# Patient Record
Sex: Male | Born: 1960
Health system: Southern US, Community
[De-identification: ages and names within clinical notes are randomized; demographics above are authoritative.]

## PROBLEM LIST (undated history)

## (undated) DIAGNOSIS — E119 Type 2 diabetes mellitus without complications: Secondary | ICD-10-CM

## (undated) DIAGNOSIS — Z8719 Personal history of other diseases of the digestive system: Secondary | ICD-10-CM

## (undated) DIAGNOSIS — H269 Unspecified cataract: Secondary | ICD-10-CM

## (undated) DIAGNOSIS — M1990 Unspecified osteoarthritis, unspecified site: Secondary | ICD-10-CM

## (undated) DIAGNOSIS — T7840XA Allergy, unspecified, initial encounter: Secondary | ICD-10-CM

## (undated) DIAGNOSIS — K219 Gastro-esophageal reflux disease without esophagitis: Secondary | ICD-10-CM

## (undated) DIAGNOSIS — R071 Chest pain on breathing: Secondary | ICD-10-CM

## (undated) DIAGNOSIS — G5600 Carpal tunnel syndrome, unspecified upper limb: Secondary | ICD-10-CM

## (undated) DIAGNOSIS — K5792 Diverticulitis of intestine, part unspecified, without perforation or abscess without bleeding: Secondary | ICD-10-CM

## (undated) DIAGNOSIS — J302 Other seasonal allergic rhinitis: Secondary | ICD-10-CM

## (undated) DIAGNOSIS — K625 Hemorrhage of anus and rectum: Secondary | ICD-10-CM

## (undated) DIAGNOSIS — N183 Chronic kidney disease, stage 3 unspecified: Secondary | ICD-10-CM

## (undated) DIAGNOSIS — M810 Age-related osteoporosis without current pathological fracture: Secondary | ICD-10-CM

## (undated) DIAGNOSIS — R0789 Other chest pain: Secondary | ICD-10-CM

## (undated) DIAGNOSIS — E785 Hyperlipidemia, unspecified: Secondary | ICD-10-CM

## (undated) DIAGNOSIS — I10 Essential (primary) hypertension: Secondary | ICD-10-CM

## (undated) DIAGNOSIS — G8929 Other chronic pain: Secondary | ICD-10-CM

## (undated) HISTORY — DX: Diverticulitis of intestine, part unspecified, without perforation or abscess without bleeding: K57.92

## (undated) HISTORY — DX: Unspecified cataract: H26.9

## (undated) HISTORY — DX: Unspecified osteoarthritis, unspecified site: M19.90

## (undated) HISTORY — DX: Allergy, unspecified, initial encounter: T78.40XA

## (undated) HISTORY — DX: Carpal tunnel syndrome, unspecified upper limb: G56.00

## (undated) HISTORY — DX: Personal history of other diseases of the digestive system: Z87.19

## (undated) HISTORY — DX: Chronic kidney disease, stage 3 (moderate): N18.3

## (undated) HISTORY — DX: Other chest pain: R07.89

## (undated) HISTORY — DX: Age-related osteoporosis without current pathological fracture: M81.0

## (undated) HISTORY — PX: FRACTURE SURGERY: SHX138

## (undated) HISTORY — DX: Chest pain on breathing: R07.1

## (undated) HISTORY — PX: ANKLE FRACTURE SURGERY: SHX122

## (undated) HISTORY — PX: OTHER SURGICAL HISTORY: SHX169

## (undated) HISTORY — PX: WRIST SURGERY: SHX841

## (undated) HISTORY — DX: Type 2 diabetes mellitus without complications: E11.9

## (undated) HISTORY — DX: Hemorrhage of anus and rectum: K62.5

## (undated) HISTORY — DX: Chronic kidney disease, stage 3 unspecified: N18.30

## (undated) HISTORY — PX: HERNIA REPAIR: SHX51

---

## 1982-03-04 HISTORY — PX: WRIST FRACTURE SURGERY: SHX121

## 1999-03-22 ENCOUNTER — Encounter: Payer: Self-pay | Admitting: *Deleted

## 1999-03-22 ENCOUNTER — Ambulatory Visit (HOSPITAL_COMMUNITY): Admission: RE | Admit: 1999-03-22 | Discharge: 1999-03-22 | Payer: Self-pay | Admitting: *Deleted

## 2001-07-19 ENCOUNTER — Emergency Department (HOSPITAL_COMMUNITY): Admission: EM | Admit: 2001-07-19 | Discharge: 2001-07-19 | Payer: Self-pay | Admitting: Emergency Medicine

## 2003-08-12 ENCOUNTER — Inpatient Hospital Stay (HOSPITAL_COMMUNITY): Admission: EM | Admit: 2003-08-12 | Discharge: 2003-08-17 | Payer: Self-pay | Admitting: Emergency Medicine

## 2004-07-19 ENCOUNTER — Observation Stay (HOSPITAL_COMMUNITY): Admission: RE | Admit: 2004-07-19 | Discharge: 2004-07-20 | Payer: Self-pay | Admitting: Orthopedic Surgery

## 2004-11-22 ENCOUNTER — Emergency Department (HOSPITAL_COMMUNITY): Admission: EM | Admit: 2004-11-22 | Discharge: 2004-11-23 | Payer: Self-pay | Admitting: Emergency Medicine

## 2011-04-23 ENCOUNTER — Emergency Department (HOSPITAL_COMMUNITY): Payer: Worker's Compensation

## 2011-04-23 ENCOUNTER — Encounter (HOSPITAL_COMMUNITY): Payer: Self-pay | Admitting: *Deleted

## 2011-04-23 ENCOUNTER — Emergency Department (HOSPITAL_COMMUNITY)
Admission: EM | Admit: 2011-04-23 | Discharge: 2011-04-23 | Disposition: A | Discharge status: Home Health | Payer: Worker's Compensation | Attending: Emergency Medicine | Admitting: Emergency Medicine

## 2011-04-23 DIAGNOSIS — M79609 Pain in unspecified limb: Secondary | ICD-10-CM | POA: Insufficient documentation

## 2011-04-23 DIAGNOSIS — IMO0002 Reserved for concepts with insufficient information to code with codable children: Secondary | ICD-10-CM | POA: Insufficient documentation

## 2011-04-23 DIAGNOSIS — Z79899 Other long term (current) drug therapy: Secondary | ICD-10-CM | POA: Insufficient documentation

## 2011-04-23 DIAGNOSIS — S6990XA Unspecified injury of unspecified wrist, hand and finger(s), initial encounter: Secondary | ICD-10-CM | POA: Insufficient documentation

## 2011-04-23 DIAGNOSIS — M25539 Pain in unspecified wrist: Secondary | ICD-10-CM | POA: Insufficient documentation

## 2011-04-23 DIAGNOSIS — M25439 Effusion, unspecified wrist: Secondary | ICD-10-CM | POA: Insufficient documentation

## 2011-04-23 DIAGNOSIS — I10 Essential (primary) hypertension: Secondary | ICD-10-CM | POA: Insufficient documentation

## 2011-04-23 DIAGNOSIS — S63502A Unspecified sprain of left wrist, initial encounter: Secondary | ICD-10-CM

## 2011-04-23 DIAGNOSIS — S63509A Unspecified sprain of unspecified wrist, initial encounter: Secondary | ICD-10-CM | POA: Insufficient documentation

## 2011-04-23 DIAGNOSIS — Y990 Civilian activity done for income or pay: Secondary | ICD-10-CM | POA: Insufficient documentation

## 2011-04-23 DIAGNOSIS — S59909A Unspecified injury of unspecified elbow, initial encounter: Secondary | ICD-10-CM | POA: Insufficient documentation

## 2011-04-23 HISTORY — DX: Essential (primary) hypertension: I10

## 2011-04-23 MED ORDER — ACETAMINOPHEN 500 MG PO TABS
1000.0000 mg | ORAL_TABLET | ORAL | Status: AC
  Administered 2011-04-23: 12:00:00 via ORAL
  Filled 2011-04-23: qty 2

## 2011-04-23 MED ORDER — ACETAMINOPHEN 325 MG PO TABS
ORAL_TABLET | ORAL | Status: AC
  Filled 2011-04-23: qty 3

## 2011-04-23 NOTE — Discharge Instructions (Signed)
Joint Sprain A sprain is a tear or stretch in the ligaments that hold a joint together. Severe sprains may need as long as 3-6 weeks of immobilization and/or exercises to heal completely. Sprained joints should be rested and protected. If not, they can become unstable and prone to re-injury. Proper treatment can reduce your pain, shorten the period of disability, and reduce the risk of repeated injuries. TREATMENT   Rest and elevate the injured joint to reduce pain and swelling.   Apply ice packs to the injury for 20-30 minutes every 2-3 hours for the next 2-3 days.   Keep the injury wrapped in a compression bandage or splint as long as the joint is painful or as instructed by your caregiver.   Do not use the injured joint until it is completely healed to prevent re-injury and chronic instability. Follow the instructions of your caregiver.   Long-term sprain management may require exercises and/or treatment by a physical therapist. Taping or special braces may help stabilize the joint until it is completely better.  SEEK MEDICAL CARE IF:   You develop increased pain or swelling of the joint.   You develop increasing redness and warmth of the joint.   You develop a fever.   It becomes stiff.   Your hand or foot gets cold or numb.  Document Released: 03/28/2004 Document Revised: 10/31/2010 Document Reviewed: 03/07/2008 [facility] Patient Information 2012 [place], [place].  Wash the abrasion each day with soap and water on your left forearm and then place a thin layer of bacitracin ointment and cover with a sterile bandage. Wear the splint as needed for comfort on your wrist. See your work comp doctor tomorrow for followup Your x-ray today was normal. Take Tylenol as directed for pain

## 2011-04-23 NOTE — ED Provider Notes (Signed)
History     CSN: [id number]  Arrival date & time 04/23/11  1020   First MD Initiated Contact with Patient 04/23/11 1122      Chief Complaint  Patient presents with  . Arm Injury  . Wrist Pain    (Consider location/radiation/quality/duration/timing/severity/associated sxs/prior treatment) HPI Complains of left wrist pain and abrasion to left forearm after injury occurring 9:45 AM today. Left wrist is worse with movement radial aspect of wrist improved by anything abrasion nonpainful no other injury. Injury occurred at work when a spring loaded bed struck him as a direct blow to the left upper extremity. No other associated symptoms no treatment prior to coming here last tetanus shot less than 10 years ago. Past Medical History  Diagnosis Date  . Hypertension     Past Surgical History  Procedure Date  . Ankle fracture surgery   . Wrist surgery     History reviewed. No pertinent family history.  History  Substance Use Topics  . Smoking status: Never Smoker   . Smokeless tobacco: Not on file  . Alcohol Use: No      Review of Systems  Constitutional: Negative.   Musculoskeletal: Positive for arthralgias.       Left wrist pain  Skin: Positive for wound.       Abrasion to left forearm    Allergies  Codeine; Ivp dye; and Sulfa antibiotics  Home Medications   Current Outpatient Rx  Name Route Sig Dispense Refill  . AMLODIPINE BESYLATE 10 MG PO TABS Oral Take 10 mg by mouth daily.    . ASPIRIN EC 81 MG PO TBEC Oral Take 81 mg by mouth daily.    Marland Kitchen CALCIUM CARBONATE 600 MG PO TABS Oral Take 600 mg by mouth 2 (two) times daily with a meal.    . CARVEDILOL PO Oral Take 1 tablet by mouth 2 (two) times daily.    Marland Kitchen CETIRIZINE HCL 10 MG PO TABS Oral Take 10 mg by mouth daily.    . LUTEIN PO Oral Take 1 tablet by mouth daily.    . MELOXICAM 15 MG PO TABS Oral Take 15 mg by mouth daily.    . ADULT MULTIVITAMIN W/MINERALS CH Oral Take 1 tablet by mouth daily.    Marland Kitchen OMEPRAZOLE  20 MG PO CPDR Oral Take 20 mg by mouth daily.    Marland Kitchen SIMVASTATIN 20 MG PO TABS Oral Take 20 mg by mouth every evening.      BP 139/84  Pulse 80  Temp(Src) 98.2 F (36.8 C) (Oral)  Resp 15  SpO2 98%  Physical Exam  Nursing note and vitals reviewed. Constitutional: He appears well-developed and well-nourished. No distress.  HENT:  Head: Normocephalic and atraumatic.  Eyes: Pupils are equal, round, and reactive to light.  Neck: Neck supple.  Cardiovascular: Normal rate.   Pulmonary/Chest: Effort normal.  Abdominal: There is no tenderness.  Musculoskeletal:       Left upper extremity: Abrasion at ulnar aspect of forearm middle third approximately 3 cm in diameter, irregular, nontender wrist with swelling over dorsal lateral aspect tender at radial aspect of wrist no anatomic snuffbox tenderness radial pulse 2+ all fingers with good capillary refill    ED Course  Procedures (including critical care time)  Labs Reviewed - No data to display No results found.  No results found for this or any previous visit. Dg Wrist Complete Left  04/23/2011  *RADIOLOGY REPORT*  Clinical Data: Injured left wrist with pain  LEFT WRIST -  COMPLETE 3+ VIEW  Comparison: None.  Findings: The radiocarpal joint space is only slightly narrowed. No acute fracture is seen.  There are degenerative changes in the left wrist with subchondral cyst formation and sclerosis. Alignment is normal.  IMPRESSION: Degenerative and possibly post-traumatic change.  No acute abnormality.  Original Report Authenticated By: Juline Patch, M.D.    No diagnosis found.    MDM  Declines pain medicine and declines prescription for pain medicine Plan Velcro wrist splint follow up with Worker's Comp. Doctor tomorrow Local wound care with antibiotic ointment and cleansing of abrasion at left forearm Diagnosis #1 left wrist sprain #2 abrasion left forearm         Doug Sou, MD 04/23/11 1304

## 2011-07-18 ENCOUNTER — Other Ambulatory Visit: Payer: Self-pay | Admitting: Orthopedic Surgery

## 2011-07-22 ENCOUNTER — Encounter (HOSPITAL_BASED_OUTPATIENT_CLINIC_OR_DEPARTMENT_OTHER): Payer: 59

## 2011-07-22 ENCOUNTER — Encounter (HOSPITAL_BASED_OUTPATIENT_CLINIC_OR_DEPARTMENT_OTHER): Payer: Self-pay | Admitting: *Deleted

## 2011-07-22 ENCOUNTER — Encounter (HOSPITAL_BASED_OUTPATIENT_CLINIC_OR_DEPARTMENT_OTHER)
Admission: RE | Admit: 2011-07-22 | Discharge: 2011-07-22 | Disposition: A | Discharge status: Home / Self Care | Payer: 59 | Source: Ambulatory Visit | Attending: Orthopedic Surgery | Admitting: Orthopedic Surgery

## 2011-07-22 LAB — BASIC METABOLIC PANEL
BUN: 20 mg/dL (ref 6–23)
CO2: 23 mEq/L (ref 19–32)
Calcium: 9.4 mg/dL (ref 8.4–10.5)
Creatinine, Ser: 1.13 mg/dL (ref 0.50–1.35)
GFR calc Af Amer: 87 mL/min — ABNORMAL LOW (ref >90)
GFR calc non Af Amer: 75 mL/min — ABNORMAL LOW (ref >90)
Potassium: 3.9 mEq/L (ref 3.5–5.1)

## 2011-07-22 NOTE — Progress Notes (Signed)
Sister to pic him up To come in for ekg and labs

## 2011-07-22 NOTE — H&P (Signed)
Shawn Ball is an 51 y.o. male.   Chief Complaint: c/o STS symptoms right long finger HPI: Patient is a 51 y/o male c/o persistent symptoms of STS of the right long finger since an on the work injury he sustained on 04/23/11. He was injected on 05/17/11 without relief. He presented recently for evaluation due to continued STS of the right long finger.  Past Medical History  Diagnosis Date  . Hypertension   . GERD (gastroesophageal reflux disease)   . Hyperlipemia   . Chronic bronchitis   . Seasonal allergies     Past Surgical History  Procedure Date  . Ankle fracture surgery   . Wrist surgery   . Fracture surgery 2001    lt ankle x2  . Wrist fracture surgery 1984    lt with bone graft    No family history on file. Social History:  reports that he has never smoked. He does not have any smokeless tobacco history on file. He reports that he does not drink alcohol. His drug history not on file.  Allergies:  Allergies  Allergen Reactions  . Codeine Nausea And Vomiting  . Ivp Dye (Iodinated Diagnostic Agents)   . Sulfa Antibiotics     No prescriptions prior to admission    No results found for this or any previous visit (from the past 48 hour(s)).  No results found.   Pertinent items are noted in HPI.  Height 6\' 1"  (1.854 m), weight 106.595 kg (235 lb).  General appearance: alert Head: Normocephalic, without obvious abnormality Neck: supple, symmetrical, trachea midline Resp: clear to auscultation bilaterally Cardio: regular rate and rhythm GI: normal findings: bowel sounds normal Extremities:He has full ROM of his fingers in flexion/extension on the left. On the right he has locking of his right long finger in flexion consistent with stenosing tenosynovitis. He notes morning stiffness in his right index, long, and ring fingers and occasional numbness. He does not show signs of entrapment neuropathy on exam at this time. He has normal pulses and capillary refill. He  has no abnormal sweating, rubor or dystrophic signs.  Pulses: 2+ and symmetric Skin: normal Neurologic: Grossly normal    Assessment/Plan Impression: Chronic STS right long finger  Plan: To the OR for release A-1 pulley of the right long finger. The procedure, risks and post-op course were discussed with the patient at length and he was in agreement with the plan.  DASNOIT,Jkai Arwood J 07/22/2011, 4:13 PM     H&P documentation: 07/23/2011  -History and Physical Reviewed  -Patient has been re-examined  -No change in the plan of care  Wyn Forster, MD

## 2011-07-23 ENCOUNTER — Encounter (HOSPITAL_BASED_OUTPATIENT_CLINIC_OR_DEPARTMENT_OTHER): Payer: Self-pay | Admitting: Anesthesiology

## 2011-07-23 ENCOUNTER — Encounter (HOSPITAL_BASED_OUTPATIENT_CLINIC_OR_DEPARTMENT_OTHER): Payer: Self-pay | Admitting: *Deleted

## 2011-07-23 ENCOUNTER — Encounter (HOSPITAL_BASED_OUTPATIENT_CLINIC_OR_DEPARTMENT_OTHER)
Admission: RE | Disposition: A | Discharge status: Home / Self Care | Payer: Self-pay | Source: Ambulatory Visit | Attending: Orthopedic Surgery

## 2011-07-23 ENCOUNTER — Ambulatory Visit (HOSPITAL_BASED_OUTPATIENT_CLINIC_OR_DEPARTMENT_OTHER)
Admission: RE | Admit: 2011-07-23 | Discharge: 2011-07-23 | Disposition: A | Discharge status: Home / Self Care | Payer: 59 | Source: Ambulatory Visit | Attending: Orthopedic Surgery | Admitting: Orthopedic Surgery

## 2011-07-23 ENCOUNTER — Ambulatory Visit (HOSPITAL_BASED_OUTPATIENT_CLINIC_OR_DEPARTMENT_OTHER): Payer: 59 | Admitting: Anesthesiology

## 2011-07-23 DIAGNOSIS — Z01810 Encounter for preprocedural cardiovascular examination: Secondary | ICD-10-CM | POA: Insufficient documentation

## 2011-07-23 DIAGNOSIS — M65839 Other synovitis and tenosynovitis, unspecified forearm: Secondary | ICD-10-CM | POA: Insufficient documentation

## 2011-07-23 DIAGNOSIS — K219 Gastro-esophageal reflux disease without esophagitis: Secondary | ICD-10-CM | POA: Insufficient documentation

## 2011-07-23 DIAGNOSIS — I10 Essential (primary) hypertension: Secondary | ICD-10-CM | POA: Insufficient documentation

## 2011-07-23 DIAGNOSIS — M65849 Other synovitis and tenosynovitis, unspecified hand: Secondary | ICD-10-CM | POA: Insufficient documentation

## 2011-07-23 DIAGNOSIS — M6530 Trigger finger, unspecified finger: Secondary | ICD-10-CM | POA: Insufficient documentation

## 2011-07-23 HISTORY — DX: Hyperlipidemia, unspecified: E78.5

## 2011-07-23 HISTORY — DX: Gastro-esophageal reflux disease without esophagitis: K21.9

## 2011-07-23 HISTORY — DX: Other seasonal allergic rhinitis: J30.2

## 2011-07-23 HISTORY — PX: TRIGGER FINGER RELEASE: SHX641

## 2011-07-23 LAB — POCT HEMOGLOBIN-HEMACUE: Hemoglobin: 15 g/dL (ref 13.0–17.0)

## 2011-07-23 SURGERY — RELEASE, A1 PULLEY, FOR TRIGGER FINGER
Anesthesia: Monitor Anesthesia Care | Site: Finger | Laterality: Right | Wound class: Clean

## 2011-07-23 MED ORDER — ONDANSETRON HCL 4 MG/2ML IJ SOLN
INTRAMUSCULAR | Status: DC | PRN
  Administered 2011-07-23: 4 mg via INTRAVENOUS

## 2011-07-23 MED ORDER — FENTANYL CITRATE 0.05 MG/ML IJ SOLN: 25.0000 ug | INTRAMUSCULAR | Status: DC | PRN

## 2011-07-23 MED ORDER — CHLORHEXIDINE GLUCONATE 4 % EX LIQD: 60.0000 mL | Freq: Once | CUTANEOUS | Status: DC

## 2011-07-23 MED ORDER — LIDOCAINE HCL 2 % IJ SOLN
INTRAMUSCULAR | Status: DC | PRN
  Administered 2011-07-23: 3.5 mL

## 2011-07-23 MED ORDER — PROPOFOL 10 MG/ML IV EMUL
INTRAVENOUS | Status: DC | PRN
  Administered 2011-07-23: 50 ug/kg/min via INTRAVENOUS

## 2011-07-23 MED ORDER — FENTANYL CITRATE 0.05 MG/ML IJ SOLN
INTRAMUSCULAR | Status: DC | PRN
  Administered 2011-07-23: 100 ug via INTRAVENOUS

## 2011-07-23 MED ORDER — LACTATED RINGERS IV SOLN
INTRAVENOUS | Status: DC
  Administered 2011-07-23: 07:00:00 via INTRAVENOUS

## 2011-07-23 MED ORDER — TRAMADOL HCL 50 MG PO TABS: ORAL_TABLET | ORAL | Status: AC

## 2011-07-23 MED ORDER — METOCLOPRAMIDE HCL 5 MG/ML IJ SOLN: 10.0000 mg | Freq: Once | INTRAMUSCULAR | Status: DC | PRN

## 2011-07-23 MED ORDER — LIDOCAINE HCL (CARDIAC) 20 MG/ML IV SOLN
INTRAVENOUS | Status: DC | PRN
  Administered 2011-07-23: 50 mg via INTRAVENOUS

## 2011-07-23 MED ORDER — MIDAZOLAM HCL 5 MG/5ML IJ SOLN
INTRAMUSCULAR | Status: DC | PRN
  Administered 2011-07-23: 2 mg via INTRAVENOUS

## 2011-07-23 SURGICAL SUPPLY — 41 items
BLADE SURG 15 STRL LF DISP TIS (BLADE) ×1 IMPLANT
BLADE SURG 15 STRL SS (BLADE) ×2
BNDG CMPR 9X4 STRL LF SNTH (GAUZE/BANDAGES/DRESSINGS) ×1
BNDG CMPR MD 5X2 ELC HKLP STRL (GAUZE/BANDAGES/DRESSINGS) ×1
BNDG ELASTIC 2 VLCR STRL LF (GAUZE/BANDAGES/DRESSINGS) ×2 IMPLANT
BNDG ESMARK 4X9 LF (GAUZE/BANDAGES/DRESSINGS) ×1 IMPLANT
BRUSH SCRUB EZ PLAIN DRY (MISCELLANEOUS) ×2 IMPLANT
CLOTH BEACON ORANGE TIMEOUT ST (SAFETY) ×2 IMPLANT
CORDS BIPOLAR (ELECTRODE) ×1 IMPLANT
COVER MAYO STAND STRL (DRAPES) ×2 IMPLANT
COVER TABLE BACK 60X90 (DRAPES) ×2 IMPLANT
CUFF TOURNIQUET SINGLE 18IN (TOURNIQUET CUFF) ×2 IMPLANT
DECANTER SPIKE VIAL GLASS SM (MISCELLANEOUS) IMPLANT
DRAPE EXTREMITY T 121X128X90 (DRAPE) ×2 IMPLANT
DRAPE SURG 17X23 STRL (DRAPES) ×2 IMPLANT
GAUZE SPONGE 4X4 12PLY STRL LF (GAUZE/BANDAGES/DRESSINGS) ×2 IMPLANT
GAUZE XEROFORM 1X8 LF (GAUZE/BANDAGES/DRESSINGS) ×1 IMPLANT
GLOVE BIO SURGEON STRL SZ 6.5 (GLOVE) ×1 IMPLANT
GLOVE BIO SURGEON STRL SZ7.5 (GLOVE) ×1 IMPLANT
GLOVE BIOGEL M STRL SZ7.5 (GLOVE) ×2 IMPLANT
GLOVE BIOGEL PI IND STRL 7.5 (GLOVE) IMPLANT
GLOVE BIOGEL PI INDICATOR 7.5 (GLOVE) ×1
GLOVE EXAM NITRILE PF MED BLUE (GLOVE) ×1 IMPLANT
GLOVE ORTHO TXT STRL SZ7.5 (GLOVE) ×2 IMPLANT
GOWN PREVENTION PLUS XLARGE (GOWN DISPOSABLE) ×1 IMPLANT
GOWN STRL REIN XL XLG (GOWN DISPOSABLE) ×2 IMPLANT
NDL SAFETY ECLIPSE 18X1.5 (NEEDLE) IMPLANT
NEEDLE 27GAX1X1/2 (NEEDLE) IMPLANT
NEEDLE HYPO 18GX1.5 SHARP (NEEDLE) ×2
PACK BASIN DAY SURGERY FS (CUSTOM PROCEDURE TRAY) ×2 IMPLANT
PAD CAST 4YDX4 CTTN HI CHSV (CAST SUPPLIES) ×1 IMPLANT
PADDING CAST COTTON 4X4 STRL (CAST SUPPLIES)
SPONGE GAUZE 4X4 12PLY (GAUZE/BANDAGES/DRESSINGS) ×1 IMPLANT
STOCKINETTE 4X48 STRL (DRAPES) ×2 IMPLANT
SUT ETHILON 5 0 P 3 18 (SUTURE) ×1
SUT NYLON ETHILON 5-0 P-3 1X18 (SUTURE) IMPLANT
SUT PROLENE 3 0 PS 2 (SUTURE) ×1 IMPLANT
SYR CONTROL 10ML LL (SYRINGE) ×1 IMPLANT
TOWEL OR 17X24 6PK STRL BLUE (TOWEL DISPOSABLE) ×2 IMPLANT
UNDERPAD 30X30 INCONTINENT (UNDERPADS AND DIAPERS) ×2 IMPLANT
WATER STERILE IRR 1000ML POUR (IV SOLUTION) IMPLANT

## 2011-07-23 NOTE — Transfer of Care (Signed)
Immediate Anesthesia Transfer of Care Note  Patient: Shawn Ball  Procedure(s) Performed: Procedure(s) (LRB): RELEASE TRIGGER FINGER/A-1 PULLEY (Right)  Patient Location: PACU  Anesthesia Type: MAC  Level of Consciousness: awake, alert  and oriented  Airway & Oxygen Therapy: Patient Spontanous Breathing and Patient connected to face mask oxygen  Post-op Assessment: Report given to PACU RN and Post -op Vital signs reviewed and stable  Post vital signs: Reviewed and stable  Complications: No apparent anesthesia complications

## 2011-07-23 NOTE — Brief Op Note (Signed)
07/23/2011  8:09 AM  PATIENT:  Shawn Ball  51 y.o. male  PRE-OPERATIVE DIAGNOSIS:  TRIGGER FINGER RIGHT LONG  POST-OPERATIVE DIAGNOSIS:  TRIGGER FINGER RIGHT LONG  PROCEDURE:  Procedure(s) (LRB): RELEASE TRIGGER FINGER/A-1 PULLEY (Right)  SURGEON:  Surgeon(s) and Role:    * Wyn Forster., MD - Primary  PHYSICIAN ASSISTANT:   ASSISTANTS: Mallory Shirk.A-C   ANESTHESIA:   Monitored anesthesia care  EBL:  Total I/O In: 500 [I.V.:500] Out: -   BLOOD ADMINISTERED:none  DRAINS: none   LOCAL MEDICATIONS USED:  XYLOCAINE   SPECIMEN:  No Specimen  DISPOSITION OF SPECIMEN:  N/A  COUNTS:  YES  TOURNIQUET:   Total Tourniquet Time Documented: Upper Arm (Right) - 9 minutes  DICTATION: .Other Dictation: Dictation Number [id number]  PLAN OF CARE: Discharge to home after PACU  PATIENT DISPOSITION:  PACU - hemodynamically stable.

## 2011-07-23 NOTE — Op Note (Signed)
[name], Ball              ACCOUNT NO.:  [id number]  MEDICAL RECORD NO.:  [id number]  LOCATION:                                 FACILITY:  PHYSICIAN:  Katy Fitch. Feven Alderfer, M.D. DATE OF BIRTH:  1961/02/21  DATE OF PROCEDURE:  07/23/2011 DATE OF DISCHARGE:                              OPERATIVE REPORT   PREOPERATIVE DIAGNOSIS:  Chronic stenosing tenosynovitis right long finger at A1 pulley.  POSTOPERATIVE DIAGNOSIS:  Chronic stenosing tenosynovitis right long finger at A1 pulley.  OPERATION:  Release of right long finger A1 pulley.  OPERATING SURGEON:  Katy Fitch. Raianna Slight, M.D.  ASSISTANT:  Jonni Sanger, P.A.  ANESTHESIA:  IV sedation with 2% lidocaine, flexor sheath block and field block of right long finger.  SUPERVISING ANESTHESIOLOGIST:  Janetta Hora. Gelene Mink, M.D.  INDICATIONS:  [name] is a 51 year old gentleman employed by the [employer].  He is referred for evaluation and management of chronic locking right long trigger finger.  We have tried all nonoperative measures without success.  We now bring him to the operating room for release of A1 pulley.  PROCEDURE:  [name] was brought to room 2 of the [facility], placed supine position on the operating table.  Following IV sedation under Dr. Thornton Dales supervision, the right palm was prepped with Betadine followed by injection of 2% lidocaine into the path intended incision and into the flexor sheath of the right long finger.  After few moments, excellent anesthesia was achieved.  The right hand was then prepped with Betadine soap and solution, sterilely draped.  A pneumatic tourniquet was applied to the  proximal right brachium.  Following exsanguination of right arm with Esmarch bandage, arterial tourniquet was inflated to 250 mmHg.  Following routine surgical time-out and identification of [name]'s multiple allergies, we proceeded with a short oblique incision  directly over the palpably thickened A1 pulley.  Subcutaneous tissues were carefully divided revealing early palmar fibromatosis.  The pretendinous fibers and the deep fibers of the palmar fascia, that were obviously contracted were released with scissors.  The A1 pulley was isolated. Subsequent split with scalpel and scissors.  The tendons delivered and found to be swollen but otherwise normal.  Thereafter full passive motion of fingers recovered without evidence of entrapment at the A1 pulley.  The wound was then inspected for bleeding points followed by repair of the skin with intradermal 3-0 Prolene suture.  A compressive dressing was applied with Ace wrap.  For aftercare, [name] provided prescription for tramadol 50 mg 1 p.o. q.4-6 hours p.r.n. pain, 20 tablets, without refill.  We will see him back for followup in our office in 1 week for suture removal.     Katy Fitch. Tabius Rood, M.D.     RVS/MEDQ  D:  07/23/2011  T:  07/23/2011  Job:  [id number]

## 2011-07-23 NOTE — Anesthesia Postprocedure Evaluation (Signed)
Anesthesia Post Note  Patient: Shawn Ball  Procedure(s) Performed: Procedure(s) (LRB): RELEASE TRIGGER FINGER/A-1 PULLEY (Right)  Anesthesia type: MAC  Patient location: PACU  Post pain: Pain level controlled  Post assessment: Patient's Cardiovascular Status Stable  Last Vitals:  Filed Vitals:   07/23/11 0919  BP: 135/82  Pulse: 66  Temp: 36.5 C  Resp: 16    Post vital signs: Reviewed and stable  Level of consciousness: alert  Complications: No apparent anesthesia complications

## 2011-07-23 NOTE — Anesthesia Preprocedure Evaluation (Signed)
Anesthesia Evaluation  Patient identified by MRN, date of birth, ID band Patient awake    Reviewed: Allergy & Precautions, H&P , NPO status , Patient's Chart, lab work & pertinent test results, reviewed documented beta blocker date and time   Airway Mallampati: II TM Distance: >3 FB Neck ROM: full    Dental   Pulmonary neg pulmonary ROS,          Cardiovascular hypertension, On Medications and On Home Beta Blockers     Neuro/Psych negative neurological ROS  negative psych ROS   GI/Hepatic Neg liver ROS, GERD-  Medicated and Controlled,  Endo/Other  negative endocrine ROS  Renal/GU negative Renal ROS  negative genitourinary   Musculoskeletal   Abdominal   Peds  Hematology negative hematology ROS (+)   Anesthesia Other Findings See surgeon's H&P   Reproductive/Obstetrics negative OB ROS                           Anesthesia Physical Anesthesia Plan  ASA: II  Anesthesia Plan: MAC   Post-op Pain Management:    Induction: Intravenous  Airway Management Planned: Simple Face Mask  Additional Equipment:   Intra-op Plan:   Post-operative Plan:   Informed Consent: I have reviewed the patients History and Physical, chart, labs and discussed the procedure including the risks, benefits and alternatives for the proposed anesthesia with the patient or authorized representative who has indicated his/her understanding and acceptance.   Dental Advisory Given  Plan Discussed with: CRNA and Surgeon  Anesthesia Plan Comments:         Anesthesia Quick Evaluation

## 2011-07-23 NOTE — Anesthesia Procedure Notes (Signed)
Procedure Name: MAC Performed by: Lillieanna Tuohy W Pre-anesthesia Checklist: Patient identified, Timeout performed, Emergency Drugs available, Suction available and Patient being monitored Oxygen Delivery Method: Simple face mask       

## 2011-07-23 NOTE — Discharge Instructions (Addendum)

## 2011-07-24 ENCOUNTER — Encounter (HOSPITAL_BASED_OUTPATIENT_CLINIC_OR_DEPARTMENT_OTHER): Payer: Self-pay | Admitting: Orthopedic Surgery

## 2012-06-30 ENCOUNTER — Other Ambulatory Visit: Payer: Self-pay | Admitting: Orthopedic Surgery

## 2012-06-30 DIAGNOSIS — R200 Anesthesia of skin: Secondary | ICD-10-CM

## 2012-06-30 DIAGNOSIS — M25511 Pain in right shoulder: Secondary | ICD-10-CM

## 2012-06-30 DIAGNOSIS — R531 Weakness: Secondary | ICD-10-CM

## 2012-07-07 ENCOUNTER — Other Ambulatory Visit: Payer: Self-pay | Admitting: Orthopedic Surgery

## 2012-07-07 ENCOUNTER — Ambulatory Visit
Admission: RE | Admit: 2012-07-07 | Discharge: 2012-07-07 | Disposition: A | Discharge status: Home / Self Care | Payer: 59 | Source: Ambulatory Visit | Attending: Orthopedic Surgery | Admitting: Orthopedic Surgery

## 2012-07-07 DIAGNOSIS — R531 Weakness: Secondary | ICD-10-CM

## 2012-07-07 DIAGNOSIS — M25511 Pain in right shoulder: Secondary | ICD-10-CM

## 2012-07-07 DIAGNOSIS — R200 Anesthesia of skin: Secondary | ICD-10-CM

## 2012-08-10 ENCOUNTER — Other Ambulatory Visit: Payer: Self-pay | Admitting: Physician Assistant

## 2012-08-10 ENCOUNTER — Ambulatory Visit
Admission: RE | Admit: 2012-08-10 | Discharge: 2012-08-10 | Disposition: A | Discharge status: Home / Self Care | Payer: 59 | Source: Ambulatory Visit | Attending: Physician Assistant | Admitting: Physician Assistant

## 2012-08-10 DIAGNOSIS — R059 Cough, unspecified: Secondary | ICD-10-CM

## 2012-08-10 DIAGNOSIS — R05 Cough: Secondary | ICD-10-CM

## 2012-10-28 ENCOUNTER — Ambulatory Visit (INDEPENDENT_AMBULATORY_CARE_PROVIDER_SITE_OTHER): Payer: Self-pay | Admitting: General Surgery

## 2012-11-05 ENCOUNTER — Encounter (INDEPENDENT_AMBULATORY_CARE_PROVIDER_SITE_OTHER): Payer: Self-pay | Admitting: General Surgery

## 2012-11-05 ENCOUNTER — Ambulatory Visit (INDEPENDENT_AMBULATORY_CARE_PROVIDER_SITE_OTHER): Payer: 59 | Admitting: General Surgery

## 2012-11-05 VITALS — BP 130/80 | HR 68 | Temp 98.1°F | Resp 14 | Ht 72.0 in | Wt 235.8 lb

## 2012-11-05 DIAGNOSIS — K648 Other hemorrhoids: Secondary | ICD-10-CM | POA: Insufficient documentation

## 2012-11-05 MED ORDER — HYDROCORTISONE 2.5 % RE CREA: TOPICAL_CREAM | Freq: Two times a day (BID) | RECTAL | Status: DC

## 2012-11-05 NOTE — Patient Instructions (Signed)
GETTING TO GOOD BOWEL HEALTH. Irregular bowel habits such as constipation and diarrhea can lead to many problems over time.  Having one soft bowel movement a day is the most important way to prevent further problems.  The anorectal canal is designed to handle stretching and feces to safely manage our ability to get rid of solid waste (feces, poop, stool) out of our body.  BUT, hard constipated stools can act like ripping concrete bricks and diarrhea can be a burning fire to this very sensitive area of our body, causing inflamed hemorrhoids, anal fissures, increasing risk is perirectal abscesses, abdominal pain/bloating, an making irritable bowel worse.     The goal: ONE SOFT BOWEL MOVEMENT A DAY!  To have soft, regular bowel movements:    Drink at least 8 tall glasses of water a day.     Take plenty of fiber.  Fiber is the undigested part of plant food that passes into the colon, acting s "natures broom" to encourage bowel motility and movement.  Fiber can absorb and hold large amounts of water. This results in a larger, bulkier stool, which is soft and easier to pass. Work gradually over several weeks up to 6 servings a day of fiber (25g a day even more if needed) in the form of: o Vegetables -- Root (potatoes, carrots, turnips), leafy green (lettuce, salad greens, celery, spinach), or cooked high residue (cabbage, broccoli, etc) o Fruit -- Fresh (unpeeled skin & pulp), Dried (prunes, apricots, cherries, etc ),  or stewed ( applesauce)  o Whole grain breads, pasta, etc (whole wheat)  o Bran cereals    Bulking Agents -- This type of water-retaining fiber generally is easily obtained each day by one of the following:  o Psyllium bran -- The psyllium plant is remarkable because its ground seeds can retain so much water. This product is available as Metamucil, Konsyl, Effersyllium, Per Diem Fiber, or the less expensive generic preparation in drug and health food stores. Although labeled a laxative, it really  is not a laxative.  o Methylcellulose -- This is another fiber derived from wood which also retains water. It is available as Citrucel. o Benefiber o Polyethylene Glycol - and "artificial" fiber commonly called Miralax or Glycolax.  It is helpful for people with gassy or bloated feelings with regular fiber o Flax Seed - a less gassy fiber than psyllium   No reading or other relaxing activity while on the toilet. If bowel movements take longer than 5 minutes, you are too constipated   AVOID CONSTIPATION.  High fiber and water intake usually takes care of this.  Sometimes a laxative is needed to stimulate more frequent bowel movements, but    Laxatives are not a good long-term solution as it can wear the colon out. o Osmotics (Milk of Magnesia, Fleets phosphosoda, Magnesium citrate, MiraLax, GoLytely) are safer than  o Stimulants (Senokot, Castor Oil, Dulcolax, Ex Lax)    o Do not take laxatives for more than 7days in a row.    IF SEVERELY CONSTIPATED, try a Bowel Retraining Program: o Do not use laxatives.  o Eat a diet high in roughage, such as bran cereals and leafy vegetables.  o Drink six (6) ounces of prune or apricot juice each morning.  o Eat two (2) large servings of stewed fruit each day.  o Take one (1) heaping tablespoon of a psyllium-based bulking agent twice a day. Use sugar-free sweetener when possible to avoid excessive calories.  o Eat a normal breakfast.  o Set aside 15 minutes after breakfast to sit on the toilet, but do not strain to have a bowel movement.  o If you do not have a bowel movement by the third day, use an enema and repeat the above steps.    Controlling diarrhea o Switch to liquids and simpler foods for a few days to avoid stressing your intestines further. o Avoid dairy products (especially milk & ice cream) for a short time.  The intestines often can lose the ability to digest lactose when stressed. o Avoid foods that cause gassiness or bloating.  Typical  foods include beans and other legumes, cabbage, broccoli, and dairy foods.  Every person has some sensitivity to other foods, so listen to our body and avoid those foods that trigger problems for you. o Adding fiber (Citrucel, Metamucil, psyllium, Miralax) gradually can help thicken stools by absorbing excess fluid and retrain the intestines to act more normally.  Slowly increase the dose over a few weeks.  Too much fiber too soon can backfire and cause cramping & bloating. o Probiotics (such as active yogurt, Align, etc) may help repopulate the intestines and colon with normal bacteria and calm down a sensitive digestive tract.  Most studies show it to be of mild help, though, and such products can be costly. o Medicines:   Bismuth subsalicylate (ex. Kayopectate, Pepto Bismol) every 30 minutes for up to 6 doses can help control diarrhea.  Avoid if pregnant.   Loperamide (Immodium) can slow down diarrhea.  Start with two tablets (4mg  total) first and then try one tablet every 6 hours.  Avoid if you are having fevers or severe pain.  If you are not better or start feeling worse, stop all medicines and call your doctor for advice o Call your doctor if you are getting worse or not better.  Sometimes further testing (cultures, endoscopy, X-ray studies, bloodwork, etc) may be needed to help diagnose and treat the cause of the diarrhea.  Fiber Chart  You should 25-30g of fiber per day and drinking 8 glasses of water to help your bowels move regularly.  In the chart below you can look up how much fiber you are getting in an average day.  If you are not getting enough fiber, you should add a fiber supplement to your diet.  Examples of this include Metamucil, FiberCon and Citrucel.  These can be purchased at your local grocery store or pharmacy.      [email or web address]

## 2012-11-05 NOTE — Progress Notes (Signed)
Patient ID: Shawn Rauch., male   DOB: [birth year], 52 y.o.   MRN: [id number]  Chief Complaint  Patient presents with  . New Evaluation    eval hems    HPI Shawn Muzquiz. is a 52 y.o. male.   HPI 52 yo WM referred by Milus Height, PA, for evaluation of hemorrhoids. The patient states that he started having rectal and hemorrhoidal problems back in February of this year. He states that he was straining while lifting some weights at the gym and he says when he developed some rectal pain and bright red blood in the stool. He also complains of intermittent perianal itching and burning. He has tried numerous over-the-counter ointments without much success. He states that his hemorrhoids will go in and out. He states that he does not drink much water. He has a bowel movement every day. He denies any significant pain while having a bowel movement. Occasionally there'll be streaks of blood on the toilet paper. He is not a bathroom reader. He has never had a colonoscopy. There is no family history of colorectal cancer. He does not take any supplemental fiber. Past Medical History  Diagnosis Date  . Hypertension   . GERD (gastroesophageal reflux disease)   . Hyperlipemia   . Chronic bronchitis   . Seasonal allergies   . Arthritis     Past Surgical History  Procedure Laterality Date  . Ankle fracture surgery    . Wrist surgery    . Fracture surgery  2001    lt ankle x2  . Wrist fracture surgery  1984    lt with bone graft  . Trigger finger release  07/23/2011    Procedure: RELEASE TRIGGER FINGER/A-1 PULLEY;  Surgeon: Wyn Forster., MD;  Location: [facility];  Service: Orthopedics;  Laterality: Right;    Family History  Problem Relation Age of Onset  . Cancer Father     skin    Social History History  Substance Use Topics  . Smoking status: Never Smoker   . Smokeless tobacco: Never Used  . Alcohol Use: No    Allergies  Allergen Reactions  . Codeine  Nausea And Vomiting  . Ivp Dye [Iodinated Diagnostic Agents]   . Penicillins Nausea And Vomiting    As a child  . Sulfa Antibiotics     Current Outpatient Prescriptions  Medication Sig Dispense Refill  . aspirin EC 81 MG tablet Take 81 mg by mouth daily.      . Biotin 10 MG TABS Take by mouth.      . calcium carbonate (OS-CAL) 600 MG TABS Take 600 mg by mouth 2 (two) times daily with a meal.      . carvedilol (COREG) 12.5 MG tablet Take 12.5 mg by mouth 2 (two) times daily with a meal.      . cetirizine (ZYRTEC) 10 MG tablet Take 10 mg by mouth daily.      . Cinnamon 500 MG capsule Take 500 mg by mouth daily.      . fish oil-omega-3 fatty acids 1000 MG capsule Take 2 g by mouth daily.      Marland Kitchen glucosamine-chondroitin 500-400 MG tablet Take 1 tablet by mouth 3 (three) times daily.      . hydrocortisone (PROCTOSOL HC) 2.5 % rectal cream Place rectally 2 (two) times daily.  30 g  1  . losartan (COZAAR) 50 MG tablet       . LUTEIN PO Take 1  tablet by mouth daily.      . meloxicam (MOBIC) 15 MG tablet Take 15 mg by mouth daily.      . Multiple Vitamin (MULITIVITAMIN WITH MINERALS) TABS Take 1 tablet by mouth daily.      Marland Kitchen omeprazole (PRILOSEC) 20 MG capsule Take 20 mg by mouth daily.      . Probiotic Product (PROBIOTIC DAILY PO) Take by mouth.      . simvastatin (ZOCOR) 20 MG tablet Take 20 mg by mouth every evening.       No current facility-administered medications for this visit.    Review of Systems Review of Systems  Constitutional: Negative for fever, chills, appetite change and unexpected weight change.  HENT: Positive for congestion and postnasal drip. Negative for trouble swallowing.   Eyes: Negative for visual disturbance.  Respiratory: Negative for chest tightness and shortness of breath.   Cardiovascular: Negative for chest pain and leg swelling.       No PND, no orthopnea, no DOE  Gastrointestinal: Positive for blood in stool and rectal pain.       See HPI  Genitourinary:  Negative for dysuria and hematuria.  Musculoskeletal: Negative.   Skin: Negative for rash.  Neurological: Negative for seizures and speech difficulty.  Hematological: Does not bruise/bleed easily.  Psychiatric/Behavioral: Negative for behavioral problems and confusion.    Blood pressure 130/80, pulse 68, temperature 98.1 F (36.7 C), temperature source Temporal, resp. rate 14, height 6' (1.829 m), weight 235 lb 12.8 oz (106.958 kg).  Physical Exam Physical Exam  Vitals reviewed. Constitutional: He is oriented to person, place, and time. He appears well-developed and well-nourished. No distress.  HENT:  Head: Normocephalic and atraumatic.  Right Ear: External ear normal.  Left Ear: External ear normal.  Eyes: Conjunctivae are normal. No scleral icterus.  Neck: Normal range of motion. Neck supple. No tracheal deviation present. No thyromegaly present.  Cardiovascular: Normal rate, normal heart sounds and intact distal pulses.   Pulmonary/Chest: Effort normal and breath sounds normal. No respiratory distress. He has no wheezes.  Abdominal: Soft. He exhibits no distension. There is no tenderness. There is no rebound and no guarding.  Genitourinary: Rectal exam shows no fissure and anal tone normal.     Visual inspection reveals nonthrombosed external hemorrhoidal tissue in the anterior midline and left lateral position. Good tone. Anoscopy shows mixed Column left lateral And anterior midline mixed column hemorrhoid  Musculoskeletal: Normal range of motion. He exhibits no edema and no tenderness.  Lymphadenopathy:    He has no cervical adenopathy.  Neurological: He is alert and oriented to person, place, and time. He exhibits normal muscle tone.  Skin: Skin is warm and dry. No rash noted. He is not diaphoretic. No erythema. No pallor.  Psychiatric: He has a normal mood and affect. His behavior is normal. Judgment and thought content normal.    Data Reviewed Ms. Redmon, PA note  10/01/12  Assessment    Internal/external bleeding hemorrhoids - 2 column     Plan    We discussed the etiology of hemorrhoids. The patient was given educational material as well as diagrams. We discussed nonoperative and operative management of hemorrhoidal disease.  We discussed the importance of having a daily soft bowel movement and avoiding constipation. We also discussed good bowel habits such as not reading in the bathroom, not straining, and drinking 6-8 glasses of water per day. We also discussed the importance of a high fiber diet. We discussed foods that were high  in fiber as well as fiber supplements. We discussed the importance of trying to get 25-30 g of fiber per day in their diet. We discussed the need to start with a low dose of fiber and then gradually increasing their daily fiber dose over several weeks in order to avoid bloating and cramping.  We then discussed different surgical techniques for hemorrhoids, specifically hemorrhoidal banding and excisional hemorrhoidectomy.  PLAN: We agreed to start with nonoperative management. He was instructed to increase his water intake to at least 6 glasses of water a day. I also encouraged him to start a high fiber diet. We discussed using supplemental fiber such as Benefiber or Metamucil. We rediscussed the importance of slowly adding fiber to his diet in order to avoid bloating and cramping. He was instructed to do this on a daily basis. I also gave him a refill on his hemorrhoidal cream. Followup 6 weeks. If he is still symptomatic then we will entertain exam under anesthesia with hemorrhoidal banding and excisional hemorrhoidectomy  [name]. Andrey Campanile, MD, FACS General, Bariatric, & Minimally Invasive Surgery [facility], [place]          [facility] M 11/05/2012, 4:00 PM

## 2012-12-04 ENCOUNTER — Telehealth (INDEPENDENT_AMBULATORY_CARE_PROVIDER_SITE_OTHER): Payer: Self-pay | Admitting: General Surgery

## 2012-12-04 NOTE — Telephone Encounter (Signed)
Called pt and he wants to proceed surgery for his hemorrhoids. Still having symptoms despite best practices. Discussed risk/benefits and typical postop course. Will plan EUA, excisional hemorrhoidectomy possible banding.

## 2012-12-04 NOTE — Telephone Encounter (Signed)
Message copied by June Leap on Fri Dec 04, 2012  2:58 PM ------      Message from: Louie Casa      Created: Fri Dec 04, 2012  9:17 AM      Regarding: Dr. Roxy Horseman       Contact: [phone]       Patient called requested to speak with Dr. Andrey Campanile because he is still the same no better and wants to know if on his next appointment 12/17/12  can go a head with the procedure the doctor explained       in his last appointment(exam under anesthesia with hemorrhoidal banding and excisional hemorrhoidectomy),Please call him.             Thank you.       ------

## 2012-12-04 NOTE — Telephone Encounter (Signed)
Per EW orders were filled out and taken to OR schd on 12/04/12 @3 :00.Marland KitchenMarland Kitchenpt was notified to stop ASA 5 days prior to surgery and to perform fleets enema the a.m before surgery per OR orders..pt verblaized agreement with POC at this time

## 2012-12-09 ENCOUNTER — Encounter (HOSPITAL_BASED_OUTPATIENT_CLINIC_OR_DEPARTMENT_OTHER): Payer: Self-pay | Admitting: *Deleted

## 2012-12-09 ENCOUNTER — Other Ambulatory Visit (INDEPENDENT_AMBULATORY_CARE_PROVIDER_SITE_OTHER): Payer: Self-pay | Admitting: General Surgery

## 2012-12-09 NOTE — Progress Notes (Signed)
Pt has been here for surgery-has chigger bites-got steroid inj today and put on a dose pack-had recent labs and ekg-will call He is very hyper Instructed on fleets

## 2012-12-15 ENCOUNTER — Ambulatory Visit (HOSPITAL_BASED_OUTPATIENT_CLINIC_OR_DEPARTMENT_OTHER)
Admission: RE | Admit: 2012-12-15 | Discharge: 2012-12-15 | Disposition: A | Discharge status: Home / Self Care | Payer: 59 | Source: Ambulatory Visit | Attending: General Surgery | Admitting: General Surgery

## 2012-12-15 ENCOUNTER — Encounter (HOSPITAL_BASED_OUTPATIENT_CLINIC_OR_DEPARTMENT_OTHER): Payer: 59 | Admitting: Certified Registered Nurse Anesthetist

## 2012-12-15 ENCOUNTER — Ambulatory Visit (HOSPITAL_BASED_OUTPATIENT_CLINIC_OR_DEPARTMENT_OTHER): Payer: 59 | Admitting: Certified Registered Nurse Anesthetist

## 2012-12-15 ENCOUNTER — Encounter (HOSPITAL_BASED_OUTPATIENT_CLINIC_OR_DEPARTMENT_OTHER)
Admission: RE | Disposition: A | Discharge status: Home / Self Care | Payer: Self-pay | Source: Ambulatory Visit | Attending: General Surgery

## 2012-12-15 ENCOUNTER — Encounter (HOSPITAL_BASED_OUTPATIENT_CLINIC_OR_DEPARTMENT_OTHER): Payer: Self-pay

## 2012-12-15 DIAGNOSIS — K648 Other hemorrhoids: Secondary | ICD-10-CM | POA: Insufficient documentation

## 2012-12-15 DIAGNOSIS — M129 Arthropathy, unspecified: Secondary | ICD-10-CM | POA: Insufficient documentation

## 2012-12-15 DIAGNOSIS — I10 Essential (primary) hypertension: Secondary | ICD-10-CM | POA: Insufficient documentation

## 2012-12-15 DIAGNOSIS — E785 Hyperlipidemia, unspecified: Secondary | ICD-10-CM | POA: Insufficient documentation

## 2012-12-15 DIAGNOSIS — J309 Allergic rhinitis, unspecified: Secondary | ICD-10-CM | POA: Insufficient documentation

## 2012-12-15 DIAGNOSIS — G8929 Other chronic pain: Secondary | ICD-10-CM | POA: Insufficient documentation

## 2012-12-15 DIAGNOSIS — K645 Perianal venous thrombosis: Secondary | ICD-10-CM | POA: Insufficient documentation

## 2012-12-15 DIAGNOSIS — K644 Residual hemorrhoidal skin tags: Secondary | ICD-10-CM

## 2012-12-15 DIAGNOSIS — J42 Unspecified chronic bronchitis: Secondary | ICD-10-CM | POA: Insufficient documentation

## 2012-12-15 DIAGNOSIS — K219 Gastro-esophageal reflux disease without esophagitis: Secondary | ICD-10-CM | POA: Insufficient documentation

## 2012-12-15 HISTORY — PX: EXAMINATION UNDER ANESTHESIA: SHX1540

## 2012-12-15 HISTORY — PX: HEMORRHOIDECTOMY WITH HEMORRHOID BANDING: SHX5633

## 2012-12-15 HISTORY — DX: Other chronic pain: G89.29

## 2012-12-15 SURGERY — EXAM UNDER ANESTHESIA
Anesthesia: General | Site: Anus | Wound class: Clean Contaminated

## 2012-12-15 MED ORDER — OXYCODONE HCL 5 MG/5ML PO SOLN: 5.0000 mg | Freq: Once | ORAL | Status: DC | PRN

## 2012-12-15 MED ORDER — BUPIVACAINE LIPOSOME 1.3 % IJ SUSP
INTRAMUSCULAR | Status: DC | PRN
  Administered 2012-12-15: 20 mL

## 2012-12-15 MED ORDER — OXYCODONE HCL 5 MG PO TABS: 5.0000 mg | ORAL_TABLET | Freq: Once | ORAL | Status: DC | PRN

## 2012-12-15 MED ORDER — ONDANSETRON HCL 4 MG/2ML IJ SOLN: 4.0000 mg | Freq: Once | INTRAMUSCULAR | Status: DC | PRN

## 2012-12-15 MED ORDER — OXYCODONE HCL 5 MG PO TABS: 5.0000 mg | ORAL_TABLET | ORAL | Status: DC | PRN

## 2012-12-15 MED ORDER — DIBUCAINE 1 % RE OINT
TOPICAL_OINTMENT | RECTAL | Status: AC
  Filled 2012-12-15: qty 28

## 2012-12-15 MED ORDER — MIDAZOLAM HCL 2 MG/2ML IJ SOLN: 1.0000 mg | INTRAMUSCULAR | Status: DC | PRN

## 2012-12-15 MED ORDER — PROPOFOL 10 MG/ML IV BOLUS
INTRAVENOUS | Status: DC | PRN
  Administered 2012-12-15: 200 mg via INTRAVENOUS

## 2012-12-15 MED ORDER — HYDROMORPHONE HCL PF 1 MG/ML IJ SOLN
0.2500 mg | INTRAMUSCULAR | Status: DC | PRN
Start: 2012-12-15 — End: 2012-12-15

## 2012-12-15 MED ORDER — HEMOSTATIC AGENTS (NO CHARGE) OPTIME
TOPICAL | Status: DC | PRN
  Administered 2012-12-15: 1 via TOPICAL

## 2012-12-15 MED ORDER — BUPIVACAINE-EPINEPHRINE PF 0.25-1:200000 % IJ SOLN
INTRAMUSCULAR | Status: AC
  Filled 2012-12-15: qty 30

## 2012-12-15 MED ORDER — FENTANYL CITRATE 0.05 MG/ML IJ SOLN: 50.0000 ug | INTRAMUSCULAR | Status: DC | PRN

## 2012-12-15 MED ORDER — LIDOCAINE HCL (CARDIAC) 20 MG/ML IV SOLN
INTRAVENOUS | Status: DC | PRN
  Administered 2012-12-15: 100 mg via INTRAVENOUS

## 2012-12-15 MED ORDER — SUCCINYLCHOLINE CHLORIDE 20 MG/ML IJ SOLN
INTRAMUSCULAR | Status: DC | PRN
  Administered 2012-12-15: 100 mg via INTRAVENOUS

## 2012-12-15 MED ORDER — LACTATED RINGERS IV SOLN
INTRAVENOUS | Status: DC
  Administered 2012-12-15 (×2): via INTRAVENOUS

## 2012-12-15 MED ORDER — BUPIVACAINE HCL (PF) 0.25 % IJ SOLN
INTRAMUSCULAR | Status: AC
  Filled 2012-12-15: qty 30

## 2012-12-15 MED ORDER — DEXAMETHASONE SODIUM PHOSPHATE 4 MG/ML IJ SOLN
INTRAMUSCULAR | Status: DC | PRN
  Administered 2012-12-15: 10 mg via INTRAVENOUS

## 2012-12-15 MED ORDER — GLYCOPYRROLATE 0.2 MG/ML IJ SOLN
INTRAMUSCULAR | Status: DC | PRN
  Administered 2012-12-15: 0.2 mg via INTRAVENOUS

## 2012-12-15 MED ORDER — MIDAZOLAM HCL 5 MG/5ML IJ SOLN
INTRAMUSCULAR | Status: DC | PRN
  Administered 2012-12-15: 2 mg via INTRAVENOUS

## 2012-12-15 MED ORDER — FENTANYL CITRATE 0.05 MG/ML IJ SOLN
INTRAMUSCULAR | Status: DC | PRN
  Administered 2012-12-15: 100 ug via INTRAVENOUS

## 2012-12-15 MED ORDER — ONDANSETRON HCL 4 MG/2ML IJ SOLN
INTRAMUSCULAR | Status: DC | PRN
  Administered 2012-12-15: 4 mg via INTRAMUSCULAR

## 2012-12-15 MED ORDER — MEPERIDINE HCL 25 MG/ML IJ SOLN: 6.2500 mg | INTRAMUSCULAR | Status: DC | PRN

## 2012-12-15 MED ORDER — BUPIVACAINE-EPINEPHRINE 0.25% -1:200000 IJ SOLN
INTRAMUSCULAR | Status: DC | PRN
  Administered 2012-12-15: 1.5 mL

## 2012-12-15 SURGICAL SUPPLY — 41 items
BLADE SURG 15 STRL LF DISP TIS (BLADE) ×1 IMPLANT
BLADE SURG 15 STRL SS (BLADE) ×2
BRIEF STRETCH FOR OB PAD LRG (UNDERPADS AND DIAPERS) ×2 IMPLANT
CANISTER SUCTION 1200CC (MISCELLANEOUS) ×1 IMPLANT
CLEANER CAUTERY TIP 5X5 PAD (MISCELLANEOUS) ×1 IMPLANT
DECANTER SPIKE VIAL GLASS SM (MISCELLANEOUS) ×1 IMPLANT
DRAPE PED LAPAROTOMY (DRAPES) ×1 IMPLANT
DRSG PAD ABDOMINAL 8X10 ST (GAUZE/BANDAGES/DRESSINGS) ×2 IMPLANT
ELECT REM PT RETURN 9FT ADLT (ELECTROSURGICAL) ×2
ELECTRODE REM PT RTRN 9FT ADLT (ELECTROSURGICAL) ×1 IMPLANT
GLOVE BIO SURGEON STRL SZ 6.5 (GLOVE) ×1 IMPLANT
GLOVE BIO SURGEON STRL SZ7.5 (GLOVE) ×1 IMPLANT
GLOVE BIOGEL PI IND STRL 8 (GLOVE) ×1 IMPLANT
GLOVE BIOGEL PI INDICATOR 8 (GLOVE) ×1
GLOVE ECLIPSE 8.0 STRL XLNG CF (GLOVE) ×2 IMPLANT
GLOVE EXAM NITRILE MD LF STRL (GLOVE) ×1 IMPLANT
GLOVE SURG SS PI 7.0 STRL IVOR (GLOVE) ×1 IMPLANT
GOWN PREVENTION PLUS XLARGE (GOWN DISPOSABLE) ×3 IMPLANT
GOWN PREVENTION PLUS XXLARGE (GOWN DISPOSABLE) ×1 IMPLANT
NDL HYPO 25X1 1.5 SAFETY (NEEDLE) ×1 IMPLANT
NEEDLE HYPO 22GX1.5 SAFETY (NEEDLE) ×2 IMPLANT
NEEDLE HYPO 25X1 1.5 SAFETY (NEEDLE) ×4 IMPLANT
PACK BASIN DAY SURGERY FS (CUSTOM PROCEDURE TRAY) ×2 IMPLANT
PACK LITHOTOMY IV (CUSTOM PROCEDURE TRAY) ×2 IMPLANT
PAD CLEANER CAUTERY TIP 5X5 (MISCELLANEOUS) ×1
PENCIL BUTTON HOLSTER BLD 10FT (ELECTRODE) ×2 IMPLANT
SHEARS HARMONIC 9CM CVD (BLADE) ×1 IMPLANT
SLEEVE SCD COMPRESS KNEE MED (MISCELLANEOUS) ×1 IMPLANT
SPONGE GAUZE 4X4 12PLY (GAUZE/BANDAGES/DRESSINGS) IMPLANT
SPONGE SURGIFOAM ABS GEL 100 (HEMOSTASIS) ×1 IMPLANT
SUT VIC AB 2-0 SH 27 (SUTURE) ×2
SUT VIC AB 2-0 SH 27XBRD (SUTURE) ×1 IMPLANT
SUT VICRYL AB 2 0 TIE (SUTURE) ×1 IMPLANT
SUT VICRYL AB 2 0 TIES (SUTURE) ×2
SYR CONTROL 10ML LL (SYRINGE) ×3 IMPLANT
TOWEL OR 17X24 6PK STRL BLUE (TOWEL DISPOSABLE) ×2 IMPLANT
TRAY DSU PREP LF (CUSTOM PROCEDURE TRAY) ×2 IMPLANT
TRAY PROCTOSCOPIC FIBER OPTIC (SET/KITS/TRAYS/PACK) ×1 IMPLANT
TUBE CONNECTING 20X1/4 (TUBING) ×2 IMPLANT
UNDERPAD 30X30 INCONTINENT (UNDERPADS AND DIAPERS) ×2 IMPLANT
YANKAUER SUCT BULB TIP NO VENT (SUCTIONS) ×2 IMPLANT

## 2012-12-15 NOTE — H&P (Signed)
Shawn Ball. is an 52 y.o. male.   Chief Complaint: here for surgery HPI: 52 yo WM referred by Milus Height, PA, for evaluation of hemorrhoids. The patient states that he started having rectal and hemorrhoidal problems back in February of this year. He states that he was straining while lifting some weights at the gym and he says when he developed some rectal pain and bright red blood in the stool. He also complains of intermittent perianal itching and burning. He has tried numerous over-the-counter ointments without much success. He states that his hemorrhoids will go in and out. He states that he does not drink much water. He has a bowel movement every day. He denies any significant pain while having a bowel movement. Occasionally there'll be streaks of blood on the toilet paper. He is not a bathroom reader. He has never had a colonoscopy. There is no family history of colorectal cancer. He does not take any supplemental fiber.  Update: Called pt and he wants to proceed surgery for his hemorrhoids. Still having symptoms despite best practices. Discussed risk/benefits and typical postop course. Will plan EUA, excisional hemorrhoidectomy possible banding    Past Medical History  Diagnosis Date  . Hypertension   . GERD (gastroesophageal reflux disease)   . Hyperlipemia   . Chronic bronchitis   . Seasonal allergies   . Arthritis   . Chronic pain     Past Surgical History  Procedure Laterality Date  . Ankle fracture surgery    . Wrist surgery    . Fracture surgery  2001    lt ankle x2  . Wrist fracture surgery  1984    lt with bone graft  . Trigger finger release  07/23/2011    Procedure: RELEASE TRIGGER FINGER/A-1 PULLEY;  Surgeon: Wyn Forster., MD;  Location: [facility];  Service: Orthopedics;  Laterality: Right;    Family History  Problem Relation Age of Onset  . Cancer Father     skin   Social History:  reports that he has never smoked. He has never  used smokeless tobacco. He reports that he does not drink alcohol or use illicit drugs.  Allergies:  Allergies  Allergen Reactions  . Codeine Nausea And Vomiting  . Ivp Dye [Iodinated Diagnostic Agents]   . Penicillins Nausea And Vomiting    As a child  . Sulfa Antibiotics     Medications Prior to Admission  Medication Sig Dispense Refill  . aspirin EC 81 MG tablet Take 81 mg by mouth daily.      . Biotin 10 MG TABS Take by mouth.      . calcium carbonate (OS-CAL) 600 MG TABS Take 600 mg by mouth 2 (two) times daily with a meal.      . carvedilol (COREG) 12.5 MG tablet Take 12.5 mg by mouth 2 (two) times daily with a meal.      . cetirizine (ZYRTEC) 10 MG tablet Take 10 mg by mouth daily.      . Cinnamon 500 MG capsule Take 500 mg by mouth daily.      . fish oil-omega-3 fatty acids 1000 MG capsule Take 2 g by mouth daily.      Marland Kitchen glucosamine-chondroitin 500-400 MG tablet Take 1 tablet by mouth 3 (three) times daily.      . hydrocortisone (PROCTOSOL HC) 2.5 % rectal cream Place rectally 2 (two) times daily.  30 g  1  . losartan (COZAAR) 50 MG tablet       .  LUTEIN PO Take 1 tablet by mouth daily.      . meloxicam (MOBIC) 15 MG tablet Take 15 mg by mouth daily.      . Multiple Vitamin (MULITIVITAMIN WITH MINERALS) TABS Take 1 tablet by mouth daily.      Marland Kitchen omeprazole (PRILOSEC) 20 MG capsule Take 20 mg by mouth daily.      . predniSONE (DELTASONE) 20 MG tablet Take 20 mg by mouth daily. Started 12/09/12-3 first day,2 the next-taper as directed for chigger bites      . Probiotic Product (PROBIOTIC DAILY PO) Take by mouth.      . simvastatin (ZOCOR) 20 MG tablet Take 20 mg by mouth every evening.        No results found for this or any previous visit (from the past 48 hour(s)). No results found.  Review of Systems  Constitutional: Negative for fever and chills.  HENT: Negative for ear discharge and nosebleeds.   Eyes: Negative for blurred vision and double vision.  Respiratory:  Negative for shortness of breath.   Cardiovascular: Negative for chest pain and palpitations.  Gastrointestinal: Negative for nausea, vomiting and abdominal pain.  Genitourinary: Negative for dysuria and urgency.  Neurological: Negative for dizziness, sensory change, seizures and loss of consciousness.  Psychiatric/Behavioral: Negative for suicidal ideas and substance abuse.    Blood pressure 142/99, pulse 83, temperature 98.1 F (36.7 C), temperature source Oral, resp. rate 20, height 6' (1.829 m), weight 232 lb 6.4 oz (105.416 kg), SpO2 96.00%. Physical Exam  Vitals reviewed. Constitutional: He is oriented to person, place, and time. He appears well-developed and well-nourished. No distress.  HENT:  Head: Normocephalic and atraumatic.  Right Ear: External ear normal.  Left Ear: External ear normal.  Eyes: Conjunctivae are normal.  Neck: Normal range of motion. Neck supple. No tracheal deviation present. No thyromegaly present.  Cardiovascular: Normal rate and normal heart sounds.   Respiratory: Effort normal. No respiratory distress.  GI: Soft. He exhibits no distension.  Musculoskeletal: He exhibits no edema.  Neurological: He is alert and oriented to person, place, and time.  Skin: Skin is warm and dry. He is not diaphoretic.  Psychiatric: He has a normal mood and affect. His behavior is normal. Thought content normal.     Assessment/Plan Int/external hemorrhoids  To OR for EUA, hemorrhoidectomy, poss hemorrhoidal banding.  All questions asked and answered.  [name]. Andrey Campanile, MD, FACS General, Bariatric, & Minimally Invasive Surgery [facility], [place]    [facility] M 12/15/2012, 2:24 PM

## 2012-12-15 NOTE — Brief Op Note (Signed)
12/15/2012  3:51 PM  PATIENT:  Shawn Ball.  52 y.o. male  PRE-OPERATIVE DIAGNOSIS:  Internal/External hemorroids  POST-OPERATIVE DIAGNOSIS:  Internal/External hemorroids - 3 columns  PROCEDURE:  Procedure(s): EXAM UNDER ANESTHESIA (N/A) EXCISIONAL HEMORRHOIDECTOMY WITH HEMORRHOID BANDING (N/A)  SURGEON:  Surgeon(s) and Role:    * Atilano Ina, MD - Primary  PHYSICIAN ASSISTANT: none  ASSISTANTS: none   ANESTHESIA:   general  EBL:  Total I/O In: 1300 [I.V.:1300] Out: -   BLOOD ADMINISTERED:none  DRAINS: none   LOCAL MEDICATIONS USED:  OTHER Exparel 20cc  SPECIMEN:  Source of Specimen:  hemorrhoid  DISPOSITION OF SPECIMEN:  PATHOLOGY  COUNTS:  YES  TOURNIQUET:  * No tourniquets in log *  DICTATION: .Other Dictation: Dictation Number [id number]  PLAN OF CARE: Discharge to home after PACU  PATIENT DISPOSITION:  PACU - hemodynamically stable.   Delay start of Pharmacological VTE agent (>24hrs) due to surgical blood loss or risk of bleeding: not applicable  [name]. Andrey Campanile, MD, FACS General, Bariatric, & Minimally Invasive Surgery [facility], [place] '

## 2012-12-15 NOTE — Anesthesia Postprocedure Evaluation (Signed)
Anesthesia Post Note  Patient: Shawn Ball.  Procedure(s) Performed: Procedure(s) (LRB): EXAM UNDER ANESTHESIA (N/A) EXCISIONAL HEMORRHOIDECTOMY WITH HEMORRHOID BANDING (N/A)  Anesthesia type: general  Patient location: PACU  Post pain: Pain level controlled  Post assessment: Patient's Cardiovascular Status Stable  Last Vitals:  Filed Vitals:   12/15/12 1546  BP:   Pulse: 87  Temp: 36.7 C  Resp: 22    Post vital signs: Reviewed and stable  Level of consciousness: sedated  Complications: No apparent anesthesia complications

## 2012-12-15 NOTE — Anesthesia Procedure Notes (Signed)
Procedure Name: Intubation Date/Time: 12/15/2012 2:50 PM Performed by: Zenia Resides D Pre-anesthesia Checklist: Patient identified, Emergency Drugs available, Suction available and Patient being monitored Patient Re-evaluated:Patient Re-evaluated prior to inductionOxygen Delivery Method: Circle System Utilized Preoxygenation: Pre-oxygenation with 100% oxygen Intubation Type: IV induction Ventilation: Mask ventilation without difficulty Laryngoscope Size: Mac and 3 Grade View: Grade II Tube type: Oral Tube size: 8.0 mm Number of attempts: 1 Airway Equipment and Method: stylet and oral airway Placement Confirmation: ETT inserted through vocal cords under direct vision,  positive ETCO2 and breath sounds checked- equal and bilateral Secured at: 23 cm Tube secured with: Tape Dental Injury: Teeth and Oropharynx as per pre-operative assessment

## 2012-12-15 NOTE — Transfer of Care (Signed)
Immediate Anesthesia Transfer of Care Note  Patient: Shawn Ball.  Procedure(s) Performed: Procedure(s): EXAM UNDER ANESTHESIA (N/A) EXCISIONAL HEMORRHOIDECTOMY WITH HEMORRHOID BANDING (N/A)  Patient Location: PACU  Anesthesia Type:General  Level of Consciousness: awake, alert , oriented and patient cooperative  Airway & Oxygen Therapy: Patient Spontanous Breathing and Patient connected to face mask oxygen  Post-op Assessment: Report given to PACU RN and Post -op Vital signs reviewed and stable  Post vital signs: Reviewed and stable  Complications: No apparent anesthesia complications

## 2012-12-15 NOTE — Anesthesia Preprocedure Evaluation (Signed)
Anesthesia Evaluation  Patient identified by MRN, date of birth, ID band Patient awake    Reviewed: Allergy & Precautions, H&P , NPO status , Patient's Chart, lab work & pertinent test results  Airway Mallampati: I TM Distance: >3 FB Neck ROM: Full    Dental   Pulmonary          Cardiovascular hypertension, Pt. on medications     Neuro/Psych    GI/Hepatic GERD-  Medicated and Controlled,  Endo/Other    Renal/GU      Musculoskeletal   Abdominal   Peds  Hematology   Anesthesia Other Findings   Reproductive/Obstetrics                           Anesthesia Physical Anesthesia Plan  ASA: II  Anesthesia Plan: General   Post-op Pain Management:    Induction: Intravenous  Airway Management Planned: LMA  Additional Equipment:   Intra-op Plan:   Post-operative Plan: Extubation in OR  Informed Consent: I have reviewed the patients History and Physical, chart, labs and discussed the procedure including the risks, benefits and alternatives for the proposed anesthesia with the patient or authorized representative who has indicated his/her understanding and acceptance.     Plan Discussed with: CRNA and Surgeon  Anesthesia Plan Comments:         Anesthesia Quick Evaluation  

## 2012-12-16 ENCOUNTER — Encounter (INDEPENDENT_AMBULATORY_CARE_PROVIDER_SITE_OTHER): Payer: Self-pay | Admitting: General Surgery

## 2012-12-16 ENCOUNTER — Telehealth (INDEPENDENT_AMBULATORY_CARE_PROVIDER_SITE_OTHER): Payer: Self-pay

## 2012-12-16 NOTE — Op Note (Signed)
NAME:  Shawn Ball, Shawn Ball           ACCOUNT NO.:  [id number]  MEDICAL RECORD NO.:  [id number]  LOCATION:                                 FACILITY:  PHYSICIAN:  [name]. Andrey Campanile, MD, FACSDATE OF BIRTH:  1961/01/10  DATE OF PROCEDURE:  12/15/2012 DATE OF DISCHARGE:                              OPERATIVE REPORT   PREOPERATIVE DIAGNOSIS:  Internal-external hemorrhoids.  POSTOPERATIVE DIAGNOSIS:  Internal-external hemorrhoids, 3 columns.  PROCEDURES: 1. Exam under anesthesia. 2. Excisional hemorrhoidectomy. 3. Hemorrhoidal banding.  SURGEON:  [name]. Andrey Campanile, MD, FACS  ASSISTANT SURGEON:  None.  ANESTHESIA:  General plus 20 mL of Exparel.  SPECIMEN:  Hemorrhoid.  INDICATIONS FOR PROCEDURE:  The patient is a very pleasant 52 year old gentleman who developed symptomatic hemorrhoidal problems after returning to the gym and doing a lot of heavy weight lifting.  He has tried lots of over-the-counter creams and ointments as well as increasing his water and fiber intake.  Despite good bowel habits, he continues to have symptomatic hemorrhoids.  He desired surgical intervention.  We discussed the risks and benefits of surgery including, but not limited to, bleeding, infection, injury to surrounding structures, incontinence of blood clot formation, hemorrhoidal recurrence, anal canal narrowing, urinary retention, anesthesia complications as well as the typical postoperative course.  He elected to proceed to the operating room.  DESCRIPTION OF PROCEDURE:  He was taken to the [facility] Day Surgery Room #8 and general endotracheal anesthesia was established.  Sequential compression devices were placed.  He was then placed in the prone Jackknife position with his buttocks taped apart.  His perineum and buttocks were prepped with Betadine and then draped in the usual standard surgical fashion.  A surgical time-out was performed.  Visual inspection of his perineum revealed prolapsed  non-thrombosed internal-external hemorrhoid in the anterior midline that extended to the right anterior position.  He also had what appeared to be mild thrombosed external hemorrhoid in the left lateral position.  Anoscopy was performed, which confirmed the above-mentioned findings.  Because the hemorrhoid was very broad-based in the anterior midline location and involved in external component,  I elected to excise this area.  A 1 mL of 0.25% Marcaine with epinephrine was injected into the anoderm.  I then made a V-shaped incision with #11 blade.  Then using hemostat, I lifted up the mucosa and submucosa with hemorrhoidal vessels away from the underlying muscle.  I then used the Harmonic scalpel to excise the hemorrhoid in elliptical fashion.  I excised all the internal hemorrhoidal tissue in this anterior midline position.  With respect to the left lateral internal hemorrhoid, two rubber bands were placed around the base of it.  With respect to the left posterolateral thrombosed external hemorrhoid, I made a small incision in the anoderm and extracted the clot and ligated it with Harmonic scalpel.  At this point, hemostasis had been achieved.  A 20 mL of Exparel was placed in a regional fashion.  A piece of Gelfoam was placed in the anus.  The patient was then placed in the supine position after the appropriate padding and mesh underwear had been placed.  He was extubated and brought to the recovery room in stable condition.  All needle, instrument, and sponge counts were correct x2.  There were no immediate complications.  The patient tolerated the procedure well.     [name]. Andrey Campanile, MD, FACS     EMW/MEDQ  D:  12/15/2012  T:  12/16/2012  Job:  [id number]  cc:   Milus Height, PA

## 2012-12-16 NOTE — Telephone Encounter (Signed)
The patient's mom called reporting that he has had a bowel movement this morning and he had a lot of bleeding in the toilet and dripping down his leg.  They are unsure if the packing came out.  The pt didn't sleep much last night.  It was bright red blood.  I notified Dr Andrey Campanile who said this is expected and to let us know if it continues today and is a large volume.  Also let us know if he gets dizzy or lightheaded.  I notified the pt and he said he feels better after his bowel movement and his pad is fairly dry now.  He thinks he may have got all the blood out.  He feels ok.  He will call us back if any further problems.    He thinks he may want to go back to work Monday if doing well.  He wants a return to work note with whatever restrictions on it and he can pick it up by Friday.  He is a Armed forces training and education officer.  So his note will need to say what he can't lift.  I told him to see how he feels and that he needs to be off pain medicine to work.

## 2012-12-16 NOTE — Telephone Encounter (Signed)
Letter signed by EW waiting on pt to call back and let us know when he would like to return to work

## 2012-12-17 ENCOUNTER — Encounter (INDEPENDENT_AMBULATORY_CARE_PROVIDER_SITE_OTHER): Payer: 59 | Admitting: General Surgery

## 2012-12-17 NOTE — Telephone Encounter (Signed)
RTW Letter completed and to front desk for pick up...Marland Kitchenpt notified and verbalized agreement with POC at this time

## 2012-12-18 ENCOUNTER — Telehealth (INDEPENDENT_AMBULATORY_CARE_PROVIDER_SITE_OTHER): Payer: Self-pay | Admitting: *Deleted

## 2012-12-18 ENCOUNTER — Encounter (HOSPITAL_BASED_OUTPATIENT_CLINIC_OR_DEPARTMENT_OTHER): Payer: Self-pay | Admitting: General Surgery

## 2012-12-18 NOTE — Telephone Encounter (Signed)
Spoke with pt - finally had a large long BM. Feels better. No fevers. No dysuria.  Advised him to do everything on postop instruction sheet. Bid colace, daily miralax, warm water tub soaks 3-4 times a day and after a BM, drink plenty of water, fiber supplement. If gets constipated again despite all this - get mag citrate. All questions asked and answered.

## 2012-12-18 NOTE — Telephone Encounter (Signed)
Patient called this morning stating that he is feeling very backed up.  Patient states he has been unable to have a BM since hemorrhoid surgery on 12/15/12.  Patient states he has been taking a stool softener then took 1 capful of Miralax yesterday morning and 1 capful this morning.  Patient states he has had no results from this.  Did explain that it can take some time for the Miralax to work however explained I would send a message to Dr. Andrey Campanile to make him aware and see if he has any other recommendations for the patient.  Explained that I would give him a call as soon as we have a plan.  Patient states understanding and agreeable at this time.

## 2012-12-18 NOTE — Telephone Encounter (Signed)
Pt called back to let us know that he finally had a BM since his last phone call.  He stated that it was "as big and painful as having a baby".  He was asking if Dr. Andrey Campanile had a chance to call us back with a plan.  Stated "I know he was in the OR this morning, but he should be done by now".  I let him know that I have not heard anything and that there is no note stating that Dr. Andrey Campanile has called.  Looking at the schedule, Dr. Andrey Campanile is in and out of the OR all day today, which I relayed to the pt.  Pt asked if there is anything he can do to keep from gettng constipated so that he does not have another BM like this last one.  Let him know that he should increase

## 2012-12-22 ENCOUNTER — Telehealth (INDEPENDENT_AMBULATORY_CARE_PROVIDER_SITE_OTHER): Payer: Self-pay

## 2012-12-22 NOTE — Telephone Encounter (Signed)
Pt requesting to not return to work until 12-28-12. Pt still having some discomfort. I advised pt this request will be sent to Dr Tawana Scale assistant to review with him and they will call back with recommendation. Pt can be reached at [phone].

## 2012-12-23 ENCOUNTER — Encounter (INDEPENDENT_AMBULATORY_CARE_PROVIDER_SITE_OTHER): Payer: Self-pay

## 2012-12-23 NOTE — Telephone Encounter (Signed)
Pt did call back and clarified that he wants to return to work on 12/28/12 with restrictions. I will type him a letter today and fax it to [phone] per pt's request along with mailing pt a copy.

## 2012-12-23 NOTE — Telephone Encounter (Signed)
LMOM for pt to call me so I can clarify his RTW date b/c I have two different notes from yesterday one stating he is ready to go back and the other one that he is not ready to return to work.

## 2013-01-01 ENCOUNTER — Ambulatory Visit (INDEPENDENT_AMBULATORY_CARE_PROVIDER_SITE_OTHER): Payer: 59 | Admitting: General Surgery

## 2013-01-01 ENCOUNTER — Encounter (INDEPENDENT_AMBULATORY_CARE_PROVIDER_SITE_OTHER): Payer: Self-pay | Admitting: General Surgery

## 2013-01-01 VITALS — BP 130/82 | HR 76 | Temp 97.6°F | Resp 16 | Ht 72.0 in | Wt 231.2 lb

## 2013-01-01 DIAGNOSIS — Z09 Encounter for follow-up examination after completed treatment for conditions other than malignant neoplasm: Secondary | ICD-10-CM

## 2013-01-01 NOTE — Patient Instructions (Signed)
GETTING TO GOOD BOWEL HEALTH. Irregular bowel habits such as constipation and diarrhea can lead to many problems over time.  Having one soft bowel movement a day is the most important way to prevent further problems.  The anorectal canal is designed to handle stretching and feces to safely manage our ability to get rid of solid waste (feces, poop, stool) out of our body.  BUT, hard constipated stools can act like ripping concrete bricks and diarrhea can be a burning fire to this very sensitive area of our body, causing inflamed hemorrhoids, anal fissures, increasing risk is perirectal abscesses, abdominal pain/bloating, an making irritable bowel worse.     The goal: ONE SOFT BOWEL MOVEMENT A DAY!  To have soft, regular bowel movements:    Drink at least 8 tall glasses of water a day.     Take plenty of fiber.  Fiber is the undigested part of plant food that passes into the colon, acting s "natures broom" to encourage bowel motility and movement.  Fiber can absorb and hold large amounts of water. This results in a larger, bulkier stool, which is soft and easier to pass. Work gradually over several weeks up to 6 servings a day of fiber (25g a day even more if needed) in the form of: o Vegetables -- Root (potatoes, carrots, turnips), leafy green (lettuce, salad greens, celery, spinach), or cooked high residue (cabbage, broccoli, etc) o Fruit -- Fresh (unpeeled skin & pulp), Dried (prunes, apricots, cherries, etc ),  or stewed ( applesauce)  o Whole grain breads, pasta, etc (whole wheat)  o Bran cereals    Bulking Agents -- This type of water-retaining fiber generally is easily obtained each day by one of the following:  o Psyllium bran -- The psyllium plant is remarkable because its ground seeds can retain so much water. This product is available as Metamucil, Konsyl, Effersyllium, Per Diem Fiber, or the less expensive generic preparation in drug and health food stores. Although labeled a laxative, it really  is not a laxative.  o Methylcellulose -- This is another fiber derived from wood which also retains water. It is available as Citrucel. o Polyethylene Glycol - and "artificial" fiber commonly called Miralax or Glycolax.  It is helpful for people with gassy or bloated feelings with regular fiber o Flax Seed - a less gassy fiber than psyllium   No reading or other relaxing activity while on the toilet. If bowel movements take longer than 5 minutes, you are too constipated   AVOID CONSTIPATION.  High fiber and water intake usually takes care of this.  Sometimes a laxative is needed to stimulate more frequent bowel movements, but    Laxatives are not a good long-term solution as it can wear the colon out. o Osmotics (Milk of Magnesia, Fleets phosphosoda, Magnesium citrate, MiraLax, GoLytely) are safer than  o Stimulants (Senokot, Castor Oil, Dulcolax, Ex Lax)    o Do not take laxatives for more than 7days in a row.    IF SEVERELY CONSTIPATED, try a Bowel Retraining Program: o Do not use laxatives.  o Eat a diet high in roughage, such as bran cereals and leafy vegetables.  o Drink six (6) ounces of prune or apricot juice each morning.  o Eat two (2) large servings of stewed fruit each day.  o Take one (1) heaping tablespoon of a psyllium-based bulking agent twice a day. Use sugar-free sweetener when possible to avoid excessive calories.  o Eat a normal breakfast.  o   Set aside 15 minutes after breakfast to sit on the toilet, but do not strain to have a bowel movement.  o If you do not have a bowel movement by the third day, use an enema and repeat the above steps.  Continue to soak in a warm water tub as needed

## 2013-01-03 NOTE — Progress Notes (Signed)
Subjective:     Patient ID: Shawn Ban., male   DOB: [birth year], 52 y.o.   MRN: [id number]  HPI 52 year old Caucasian male comes in for followup after undergoing exam under anesthesia, excision of a large internal-external hemorrhoid, and hemorrhoidal banding on October 14. He did develop constipation after surgery and he and became quite painful. He states he is now having regular bowel movements. He is taking Colace 3 times a day. He is also taking some MiraLAX as needed. He is no longer taking any fiber. He will occasionally soak in a bathtub. He is having a little bit of mucousy drainage. He states that the pain has gotten significantly better. He does have some itching. He denies any discomfort or pain with urination. He denies any fevers or chills. He denies any bleeding.  Review of Systems     Objective:   Physical Exam BP 130/82  Pulse 76  Temp(Src) 97.6 F (36.4 C) (Temporal)  Resp 16  Ht 6' (1.829 m)  Wt 231 lb 3.2 oz (104.872 kg)  BMI 31.35 kg/m2 Alert, no apparent distress Rectal-visual inspection only. Large shallow skin defect in the anterior midline position extending to the left and to the right. No significant edema. No cellulitis or induration. Rectal and anoscopy was deferred    Assessment:     Status post exam under anesthesia, excision of a large external-internal hemorrhoids, hemorrhoidal banding x1     Plan:     Overall I am very pleased with how the perineum appears. He appears to be slowly healing. I encouraged him to resume supplemental fiber. I told him that we'll fiber will need to be part of his lifestyle for the rest of his life. We discussed drinking plenty of water. We discussed ongoing use of sitz baths. A lot of time was spent reeducating the patient about postoperative care. Followup 6- 8 weeks   Shawn Sella. Andrey Campanile, MD, FACS General, Bariatric, & Minimally Invasive Surgery [facility], [place]

## 2013-02-02 ENCOUNTER — Telehealth (INDEPENDENT_AMBULATORY_CARE_PROVIDER_SITE_OTHER): Payer: Self-pay

## 2013-02-02 NOTE — Telephone Encounter (Signed)
Pt is s/p hemorrhoid surgery and banding 12/15/12 by Dr. Andrey Campanile.  He was last seen on 01/01/13 for a post op visit.  He reports that today he has had two bowel movements with blood in the toilet and on the toilet paper.  Minimal pain.  He denies constipation, painful urination, chills or fever.  Dr. Andrey Campanile paged and prescribed sitz baths 3 x day;start taking Metamucil and follow directions on the bottle. Continue taking the stool softeners.  Clean after each bowel movement with moistened toilet paper.  Dr. Tawana Scale nurse will check on the patient tomorrow.  Pt aware and understands POC.

## 2013-02-02 NOTE — Telephone Encounter (Signed)
No notes in encounter. 

## 2013-02-03 NOTE — Telephone Encounter (Signed)
Called and spoke with patient's wife. Patient is at work. She will have him call if he has continued problems.

## 2013-02-11 ENCOUNTER — Ambulatory Visit (INDEPENDENT_AMBULATORY_CARE_PROVIDER_SITE_OTHER): Payer: 59 | Admitting: General Surgery

## 2013-02-11 ENCOUNTER — Encounter (INDEPENDENT_AMBULATORY_CARE_PROVIDER_SITE_OTHER): Payer: Self-pay

## 2013-02-11 ENCOUNTER — Encounter (INDEPENDENT_AMBULATORY_CARE_PROVIDER_SITE_OTHER): Payer: Self-pay | Admitting: General Surgery

## 2013-02-11 VITALS — BP 118/74 | HR 68 | Temp 97.7°F | Resp 16 | Ht 72.0 in | Wt 233.4 lb

## 2013-02-11 DIAGNOSIS — Z09 Encounter for follow-up examination after completed treatment for conditions other than malignant neoplasm: Secondary | ICD-10-CM

## 2013-02-11 NOTE — Progress Notes (Signed)
Subjective:     Patient ID: Shawn Ban., male   DOB: [birth year], 52 y.o.   MRN: [id number]  HPI 52 year old Caucasian male comes in for followup after undergoing exam under anesthesia, excision of a large internal-external hemorrhoid, and hemorrhoidal banding on October 14. I last saw him about 6 weeks ago.  He states he is  having regular bowel movements. He is taking Colace 3 times a day. He is taking supplemental fiber specifically Metamucil. He reports one drop of blood. He has on occasion had some mild pain with bowel movements but nothing significant. He denies any discomfort or pain with urination. He denies any fevers or chills. He denies any bleeding.  Review of Systems     Objective:   Physical Exam BP 118/74  Pulse 68  Temp(Src) 97.7 F (36.5 C) (Temporal)  Resp 16  Ht 6' (1.829 m)  Wt 233 lb 6.4 oz (105.87 kg)  BMI 31.65 kg/m2 Alert, no apparent distress Rectal-Completely healed anoderm and perianal skin.Digital rectal exam reveals good tone. No thrombosed external hemorrhoids. No redundant external hemorrhoidal tissue. No palpable masses.    Assessment:     Status post exam under anesthesia, excision of a large external-internal hemorrhoids, hemorrhoidal banding x1     Plan:     Overall I am very pleased with how the perineum appears. He appears to have healed. We discussed the importance of ongoing daily fiber supplementation and drinking plenty of water. I explained that he could probably stop doing the sitz baths at this point. Followup as needed  [name]. Andrey Campanile, MD, FACS General, Bariatric, & Minimally Invasive Surgery [facility], [place]

## 2013-02-11 NOTE — Patient Instructions (Signed)
Continue do the bowel habits like we discussed You do not need to do sitz bathes unless you're having problems

## 2013-03-05 ENCOUNTER — Telehealth (INDEPENDENT_AMBULATORY_CARE_PROVIDER_SITE_OTHER): Payer: Self-pay | Admitting: General Surgery

## 2013-03-05 NOTE — Telephone Encounter (Signed)
Returned the patient's call and he explained that he is having intermittent pain that radiates from his buttock, through his hip and back, up to his arms.  He thought it was sciatic problems and he explained that he has previously had nerve problems from an accident he had with his leg a couple of years ago.  He explained that back then he took lyrica and neurontin to get rid of the pain.  I informed him that the procedure Dr. Andrey CampanileWilson did on him did not come into contact with his sciatic nerve and that it could have just been irritated from the surgery or post op care.  He informed me that he has started taking his mother's Rx for neurontin and now the pain has dissipated again.  Informed him that he needs to get an appointment with his PCP to have them take a look at him and prescribe a medication if they feel it is right.

## 2013-03-05 NOTE — Telephone Encounter (Signed)
Message copied by Ignacia MarvelMOFFITT, KENDALL on Fri Mar 05, 2013  3:11 PM ------      Message from: Fatima SangerFOUST, STEVIE      Created: Fri Mar 05, 2013  9:28 AM       Pt LM stating that he is having a shooting pain 2 inches from his surgery site and it is radiating into his left shoulder and he is sure it is a nerve issue and states he needs to be referred to a neurologist.  He would like to speak with you about this.  Please address.            Thank you,       Stevie ------

## 2013-05-13 ENCOUNTER — Ambulatory Visit
Admission: RE | Admit: 2013-05-13 | Discharge: 2013-05-13 | Disposition: A | Discharge status: Home / Self Care | Payer: Worker's Compensation | Source: Ambulatory Visit | Attending: Family Medicine | Admitting: Family Medicine

## 2013-05-13 ENCOUNTER — Other Ambulatory Visit: Payer: Self-pay | Admitting: Family Medicine

## 2013-05-13 DIAGNOSIS — W19XXXA Unspecified fall, initial encounter: Secondary | ICD-10-CM

## 2015-01-30 ENCOUNTER — Other Ambulatory Visit: Payer: Self-pay | Admitting: Physician Assistant

## 2015-01-30 DIAGNOSIS — R1084 Generalized abdominal pain: Secondary | ICD-10-CM

## 2015-11-29 ENCOUNTER — Other Ambulatory Visit: Payer: Self-pay | Admitting: Nephrology

## 2015-11-29 DIAGNOSIS — N183 Chronic kidney disease, stage 3 unspecified: Secondary | ICD-10-CM

## 2015-12-14 ENCOUNTER — Ambulatory Visit
Admission: RE | Admit: 2015-12-14 | Discharge: 2015-12-14 | Disposition: A | Discharge status: Home / Self Care | Payer: Commercial Managed Care - HMO | Source: Ambulatory Visit | Attending: Nephrology | Admitting: Nephrology

## 2015-12-14 DIAGNOSIS — N183 Chronic kidney disease, stage 3 unspecified: Secondary | ICD-10-CM

## 2016-03-29 DIAGNOSIS — J0141 Acute recurrent pansinusitis: Secondary | ICD-10-CM | POA: Diagnosis not present

## 2016-04-04 ENCOUNTER — Ambulatory Visit: Payer: Commercial Managed Care - HMO | Admitting: Family Medicine

## 2016-04-11 ENCOUNTER — Ambulatory Visit (INDEPENDENT_AMBULATORY_CARE_PROVIDER_SITE_OTHER): Payer: Commercial Managed Care - HMO | Admitting: Family Medicine

## 2016-04-11 ENCOUNTER — Encounter: Payer: Self-pay | Admitting: Family Medicine

## 2016-04-11 VITALS — BP 118/80 | HR 84 | Temp 98.0°F | Resp 18 | Ht 71.0 in | Wt 240.0 lb

## 2016-04-11 DIAGNOSIS — Z125 Encounter for screening for malignant neoplasm of prostate: Secondary | ICD-10-CM | POA: Diagnosis not present

## 2016-04-11 DIAGNOSIS — R7303 Prediabetes: Secondary | ICD-10-CM

## 2016-04-11 DIAGNOSIS — I10 Essential (primary) hypertension: Secondary | ICD-10-CM | POA: Diagnosis not present

## 2016-04-11 DIAGNOSIS — Z0000 Encounter for general adult medical examination without abnormal findings: Secondary | ICD-10-CM | POA: Diagnosis not present

## 2016-04-11 DIAGNOSIS — E7800 Pure hypercholesterolemia, unspecified: Secondary | ICD-10-CM

## 2016-04-11 LAB — CBC WITH DIFFERENTIAL/PLATELET
Basophils Absolute: 62 cells/uL (ref 0–200)
Basophils Relative: 1 %
EOS ABS: 310 {cells}/uL (ref 15–500)
EOS PCT: 5 %
HCT: 45.6 % (ref 38.5–50.0)
Hemoglobin: 15.4 g/dL (ref 13.0–17.0)
LYMPHS PCT: 31 %
Lymphs Abs: 1922 cells/uL (ref 850–3900)
MCH: 28.3 pg (ref 27.0–33.0)
MCHC: 33.8 g/dL (ref 32.0–36.0)
MCV: 83.7 fL (ref 80.0–100.0)
MPV: 9.2 fL (ref 7.5–12.5)
Monocytes Absolute: 310 cells/uL (ref 200–950)
Monocytes Relative: 5 %
NEUTROS ABS: 3596 {cells}/uL (ref 1500–7800)
NEUTROS PCT: 58 %
PLATELETS: 256 10*3/uL (ref 140–400)
RBC: 5.45 MIL/uL (ref 4.20–5.80)
RDW: 14.1 % (ref 11.0–15.0)
WBC: 6.2 10*3/uL (ref 3.8–10.8)

## 2016-04-11 LAB — COMPLETE METABOLIC PANEL WITH GFR
ALT: 29 U/L (ref 9–46)
AST: 24 U/L (ref 10–35)
Albumin: 4.3 g/dL (ref 3.6–5.1)
Alkaline Phosphatase: 45 U/L (ref 40–115)
BUN: 17 mg/dL (ref 7–25)
CO2: 25 mmol/L (ref 20–31)
Calcium: 9.3 mg/dL (ref 8.6–10.3)
Chloride: 106 mmol/L (ref 98–110)
Creat: 1.47 mg/dL — ABNORMAL HIGH (ref 0.70–1.33)
GFR, EST NON AFRICAN AMERICAN: 53 mL/min — AB (ref >=60)
GFR, Est African American: 62 mL/min (ref >=60)
GLUCOSE: 112 mg/dL — AB (ref 70–99)
Potassium: 4.1 mmol/L (ref 3.5–5.3)
SODIUM: 140 mmol/L (ref 135–146)
TOTAL PROTEIN: 6.8 g/dL (ref 6.1–8.1)
Total Bilirubin: 0.5 mg/dL (ref 0.2–1.2)

## 2016-04-11 LAB — LIPID PANEL
CHOL/HDL RATIO: 6.1 ratio — AB (ref <5.0)
Cholesterol: 147 mg/dL (ref <200)
HDL: 24 mg/dL — ABNORMAL LOW (ref >40)
LDL CALC: 78 mg/dL (ref <100)
Triglycerides: 223 mg/dL — ABNORMAL HIGH (ref <150)
VLDL: 45 mg/dL — ABNORMAL HIGH (ref <30)

## 2016-04-11 LAB — PSA: PSA: 0.4 ng/mL (ref <=4.0)

## 2016-04-11 NOTE — Progress Notes (Signed)
Subjective:    Patient ID: Shawn Ball., male    DOB: 14-Aug-1960, 56 y.o.   MRN: [id number]  HPI Patient is here today to establish care. He recently was let go by the city of [place] due to chronic orthopedic issues. Apparently the patient has "25% disability" due to limitations stemming from her right shoulder injury. He reports that he's had rotator cuff repair 2. He also has 25% disability due to chronic pain in his right ankle. Apparently he fractured his ankle in the past requiring surgery. 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 This causes restrictions on what work he can perform prompting him to seek disability. According to the patient this causes the city to let him go. He also has a history of prediabetes, hyperlipidemia, and hypertension. He has never had a colonoscopy. He refuses a colonoscopy. He refuses all vaccinations including flu shot. He is due for a prostate exam although he declines a rectal exam Past Medical History:  Diagnosis Date  . Arthritis   . Chronic bronchitis   . Chronic pain   . CKD (chronic kidney disease) stage 3, GFR 30-59 ml/min   . Diabetes mellitus without complication (HCC)    prediabetes  . GERD (gastroesophageal reflux disease)   . Hyperlipemia   . Hypertension   . Seasonal allergies    Past Surgical History:  Procedure Laterality Date  . ANKLE FRACTURE SURGERY    . EXAMINATION UNDER ANESTHESIA N/A 12/15/2012   Procedure: EXAM UNDER ANESTHESIA;  Surgeon: Atilano Ina, MD;  Location: [facility];  Service: General;  Laterality: N/A;  . FRACTURE SURGERY  2001   lt ankle x2  . HEMORRHOIDECTOMY WITH HEMORRHOID BANDING N/A 12/15/2012   Procedure: EXCISIONAL HEMORRHOIDECTOMY WITH HEMORRHOID BANDING;  Surgeon: Atilano Ina, MD;  Location: [facility];  Service: General;  Laterality: N/A;  . TRIGGER FINGER RELEASE  07/23/2011   Procedure: RELEASE TRIGGER  FINGER/A-1 PULLEY;  Surgeon: Wyn Forster., MD;  Location: [facility];  Service: Orthopedics;  Laterality: Right;  . WRIST FRACTURE SURGERY  1984   lt with bone graft  . WRIST SURGERY     Current Outpatient Prescriptions on File Prior to Visit  Medication Sig Dispense Refill  . aspirin EC 81 MG tablet Take 81 mg by mouth daily.    . Biotin 10 MG TABS Take by mouth.    . calcium carbonate (OS-CAL) 600 MG TABS Take 600 mg by mouth 2 (two) times daily with a meal.    . carvedilol (COREG) 12.5 MG tablet Take 12.5 mg by mouth 2 (two) times daily with a meal.    . cetirizine (ZYRTEC) 10 MG tablet Take 10 mg by mouth daily.    . Cinnamon 500 MG capsule Take 500 mg by mouth daily.    . fish oil-omega-3 fatty acids 1000 MG capsule Take 2 g by mouth daily.    . fluticasone (FLONASE) 50 MCG/ACT nasal spray Place 2 sprays into both nostrils daily.     Marland Kitchen glucosamine-chondroitin 500-400 MG tablet Take 1 tablet by mouth 3 (three) times daily.    Marland Kitchen losartan (COZAAR) 50 MG tablet Take 50 mg by mouth daily.     . LUTEIN PO Take 1 tablet by mouth daily.    . meloxicam (MOBIC) 15 MG tablet Take 15 mg by mouth daily.    . Multiple Vitamin (MULITIVITAMIN WITH MINERALS)  TABS Take 1 tablet by mouth daily.    Marland Kitchen. omeprazole (PRILOSEC) 20 MG capsule Take 20 mg by mouth daily.    . Probiotic Product (PROBIOTIC DAILY PO) Take by mouth.    . simvastatin (ZOCOR) 20 MG tablet Take 20 mg by mouth every evening.     No current facility-administered medications on file prior to visit.    Allergies  Allergen Reactions  . Amlodipine Other (See Comments)    Per pt - damage to kidney's  . Codeine Nausea And Vomiting  . Ivp Dye [Iodinated Diagnostic Agents]   . Penicillins Nausea And Vomiting    As a child  . Sulfa Antibiotics    Social History   Social History  . Marital status: Single    Spouse name: N/A  . Number of children: N/A  . Years of education: N/A   Occupational History  . Not on  file.   Social History Main Topics  . Smoking status: Never Smoker  . Smokeless tobacco: Never Used  . Alcohol use No  . Drug use: No  . Sexual activity: Not on file   Other Topics Concern  . Not on file   Social History Narrative  . No narrative on file   Family History  Problem Relation Age of Onset  . Cancer Father     skin      Review of Systems  All other systems reviewed and are negative.      Objective:   Physical Exam  Constitutional: He is oriented to person, place, and time. He appears well-developed and well-nourished. No distress.  HENT:  Right Ear: External ear normal.  Left Ear: External ear normal.  Nose: Nose normal.  Mouth/Throat: Oropharynx is clear and moist. No oropharyngeal exudate.  Eyes: Conjunctivae are normal. Pupils are equal, round, and reactive to light.  Neck: Neck supple. No JVD present. No thyromegaly present.  Cardiovascular: Normal rate, regular rhythm, normal heart sounds and intact distal pulses.  Exam reveals no gallop and no friction rub.   No murmur heard. Pulmonary/Chest: Effort normal and breath sounds normal. No respiratory distress. He has no wheezes. He has no rales. He exhibits no tenderness.  Abdominal: Soft. Bowel sounds are normal. He exhibits no distension and no mass. There is no tenderness. There is no rebound and no guarding.  Musculoskeletal: Normal range of motion. He exhibits tenderness. He exhibits no edema or deformity.  Lymphadenopathy:    He has no cervical adenopathy.  Neurological: He is alert and oriented to person, place, and time. He has normal reflexes. He displays normal reflexes. No cranial nerve deficit. Coordination normal.  Skin: He is not diaphoretic.  Psychiatric: He has a normal mood and affect. His behavior is normal. Judgment and thought content normal.  Vitals reviewed.         Prediabetes - Plan: Hemoglobin A1c  Prostate cancer screening - Plan: PSA  General medical exam - Plan: CBC  with Differential/Platelet, COMPLETE METABOLIC PANEL WITH GFR, Lipid panel, PSA  Pure hypercholesterolemia  Benign essential HTN  Patient states that he has a history of prediabetes and therefore I will check a hemoglobin A1c today. Because of his prediabetes, his goal LDL cholesterol is less than 100. I will check a fasting lipid panel. His blood pressure today is at goal. He reports that he has a history of chronic kidney disease stage III. He is also taking meloxicam for shoulder pain and ankle pain. I explained to the patient that he should  not take meloxicam or any NSAID if he has chronic kidney disease. I recommended a flu shot which he declined. I recommended a colonoscopy but she refused. I will check a PSA to screen for prostate cancer. I will defer the management of his chronic right shoulder pain in his chronic right ankle pain to his orthopedist

## 2016-04-12 LAB — HEMOGLOBIN A1C
Hgb A1c MFr Bld: 6.5 % — ABNORMAL HIGH (ref <5.7)
Mean Plasma Glucose: 140 mg/dL

## 2016-04-18 ENCOUNTER — Other Ambulatory Visit: Payer: Self-pay | Admitting: Family Medicine

## 2016-04-18 MED ORDER — LOSARTAN POTASSIUM 50 MG PO TABS: 50.0000 mg | ORAL_TABLET | Freq: Every day | ORAL | 3 refills | Status: DC

## 2016-04-26 ENCOUNTER — Telehealth: Payer: Self-pay | Admitting: Family Medicine

## 2016-04-26 MED ORDER — CARVEDILOL 12.5 MG PO TABS: 12.5000 mg | ORAL_TABLET | Freq: Two times a day (BID) | ORAL | 11 refills | Status: DC

## 2016-04-26 MED ORDER — SIMVASTATIN 20 MG PO TABS: 20.0000 mg | ORAL_TABLET | Freq: Every evening | ORAL | 3 refills | Status: DC

## 2016-04-26 NOTE — Telephone Encounter (Signed)
Medication called/sent to requested pharmacy  

## 2016-04-26 NOTE — Telephone Encounter (Signed)
Pt needs refill on coreg and zocor sent to [facility] pharmacy. If you miss him on the home phone you can call cell phone

## 2016-05-08 ENCOUNTER — Telehealth: Payer: Self-pay | Admitting: Family Medicine

## 2016-05-08 MED ORDER — FENOFIBRATE 160 MG PO TABS
160.0000 mg | ORAL_TABLET | Freq: Every day | ORAL | 3 refills | Status: DC
Start: 2016-05-08 — End: 2017-04-14

## 2016-05-08 NOTE — Telephone Encounter (Signed)
Pt needs refill on fenofibrate sent to [facility].

## 2016-05-08 NOTE — Telephone Encounter (Signed)
Medication called/sent to requested pharmacy  

## 2016-06-07 ENCOUNTER — Other Ambulatory Visit: Payer: Self-pay | Admitting: Family Medicine

## 2016-06-07 MED ORDER — OMEPRAZOLE 20 MG PO CPDR: 20.0000 mg | DELAYED_RELEASE_CAPSULE | Freq: Every day | ORAL | 0 refills | Status: DC

## 2016-06-07 NOTE — Telephone Encounter (Signed)
Medication refilled per protocol. 

## 2016-06-07 NOTE — Telephone Encounter (Signed)
Pt needs omeprazole sent to [facility] at [address] [employer].

## 2016-06-12 ENCOUNTER — Telehealth: Payer: Self-pay | Admitting: Family Medicine

## 2016-06-12 MED ORDER — OMEPRAZOLE 20 MG PO CPDR: 20.0000 mg | DELAYED_RELEASE_CAPSULE | Freq: Two times a day (BID) | ORAL | 11 refills | Status: DC

## 2016-06-12 NOTE — Telephone Encounter (Signed)
Patient calling because he said his omeprazole should have been 60 day supply instead of 30, he says he takes this twice a day and would like to know if anything can be done about this? [phone]

## 2016-06-12 NOTE — Telephone Encounter (Signed)
New rx sent to pharm for bid and pt's mother aware.

## 2016-06-14 DIAGNOSIS — J019 Acute sinusitis, unspecified: Secondary | ICD-10-CM | POA: Diagnosis not present

## 2016-06-17 ENCOUNTER — Ambulatory Visit: Payer: Commercial Managed Care - HMO | Admitting: Family Medicine

## 2016-06-27 ENCOUNTER — Other Ambulatory Visit: Payer: Self-pay | Admitting: Family Medicine

## 2016-06-27 MED ORDER — OMEPRAZOLE 20 MG PO CPDR: 20.0000 mg | DELAYED_RELEASE_CAPSULE | Freq: Two times a day (BID) | ORAL | 11 refills | Status: DC

## 2016-06-28 DIAGNOSIS — J019 Acute sinusitis, unspecified: Secondary | ICD-10-CM | POA: Insufficient documentation

## 2016-08-26 ENCOUNTER — Other Ambulatory Visit: Payer: Self-pay | Admitting: Family Medicine

## 2016-10-07 ENCOUNTER — Ambulatory Visit (INDEPENDENT_AMBULATORY_CARE_PROVIDER_SITE_OTHER): Payer: 59 | Admitting: Family Medicine

## 2016-10-07 ENCOUNTER — Encounter: Payer: Self-pay | Admitting: Family Medicine

## 2016-10-07 VITALS — BP 130/92 | HR 78 | Temp 98.1°F | Resp 18 | Ht 71.0 in | Wt 233.0 lb

## 2016-10-07 DIAGNOSIS — Z1159 Encounter for screening for other viral diseases: Secondary | ICD-10-CM

## 2016-10-07 DIAGNOSIS — E7800 Pure hypercholesterolemia, unspecified: Secondary | ICD-10-CM | POA: Diagnosis not present

## 2016-10-07 DIAGNOSIS — I10 Essential (primary) hypertension: Secondary | ICD-10-CM | POA: Diagnosis not present

## 2016-10-07 DIAGNOSIS — Z114 Encounter for screening for human immunodeficiency virus [HIV]: Secondary | ICD-10-CM

## 2016-10-07 DIAGNOSIS — R7303 Prediabetes: Secondary | ICD-10-CM

## 2016-10-07 LAB — COMPLETE METABOLIC PANEL WITH GFR
ALK PHOS: 48 U/L (ref 40–115)
ALT: 25 U/L (ref 9–46)
AST: 21 U/L (ref 10–35)
Albumin: 4.1 g/dL (ref 3.6–5.1)
BILIRUBIN TOTAL: 0.5 mg/dL (ref 0.2–1.2)
BUN: 18 mg/dL (ref 7–25)
CO2: 25 mmol/L (ref 20–32)
CREATININE: 1.5 mg/dL — AB (ref 0.70–1.33)
Calcium: 9.5 mg/dL (ref 8.6–10.3)
Chloride: 102 mmol/L (ref 98–110)
GFR, EST AFRICAN AMERICAN: 60 mL/min (ref >=60)
GFR, EST NON AFRICAN AMERICAN: 52 mL/min — AB (ref >=60)
Glucose, Bld: 112 mg/dL — ABNORMAL HIGH (ref 70–99)
Potassium: 4.3 mmol/L (ref 3.5–5.3)
Sodium: 141 mmol/L (ref 135–146)
TOTAL PROTEIN: 6.6 g/dL (ref 6.1–8.1)

## 2016-10-07 LAB — LIPID PANEL
Cholesterol: 137 mg/dL (ref <200)
HDL: 24 mg/dL — AB (ref >40)
LDL CALC: 81 mg/dL (ref <100)
TRIGLYCERIDES: 160 mg/dL — AB (ref <150)
Total CHOL/HDL Ratio: 5.7 Ratio — ABNORMAL HIGH (ref <5.0)
VLDL: 32 mg/dL — ABNORMAL HIGH (ref <30)

## 2016-10-07 NOTE — Progress Notes (Signed)
Subjective:    Patient ID: Shawn BanJoseph R Manship Jr., male    DOB: [birth year], 56 y.o.   MRN: [id number]  HPI  04/2016 Patient is here today to establish care. He recently was let go by the city of [place] due to chronic orthopedic issues. Apparently the patient has "25% disability" due to limitations stemming from her right shoulder injury. He reports that he's had rotator cuff repair 2. He also has 25% disability due to chronic pain in his right ankle. Apparently he fractured his ankle in the past requiring surgery. 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 This causes restrictions on what work he can perform prompting him to seek disability. According to the patient this causes the city to let him go. He also has a history of prediabetes, hyperlipidemia, and hypertension. He has never had a colonoscopy. He refuses a colonoscopy. He refuses all vaccinations including flu shot. He is due for a prostate exam although he declines a rectal exam.  At that time, my plan was: Patient states that he has a history of prediabetes and therefore I will check a hemoglobin A1c today. Because of his prediabetes, his goal LDL cholesterol is less than 100. I will check a fasting lipid panel. His blood pressure today is at goal. He reports that he has a history of chronic kidney disease stage III. He is also taking meloxicam for shoulder pain and ankle pain. I explained to the patient that he should not take meloxicam or any NSAID if he has chronic kidney disease. I recommended a flu shot which he declined. I recommended a colonoscopy but he refused. I will check a PSA to screen for prostate cancer. I will defer the management of his chronic right shoulder pain in his chronic right ankle pain to his orthopedist  10/07/16 Patient continues to decline colon cancer screening. We again discussed colonoscopy today as well as stool test. He again politely declines. He will  consider it. He is due for HIV and hepatitis C screening as well and this he will consent to. He is here today to follow-up his prediabetes as well as his hyperlipidemia and his chronic kidney disease. He is avoiding all NSAIDs in trying to drink plenty of fluids. Unfortunately he is not able to get any regular exercise due to his orthopedic issues. He is also not monitoring the carbohydrates in his diet. He denies any polyuria, polydipsia, or blurry vision. He denies any chest pain shortness of breath or dyspnea on exertion. His blood pressure today is borderline. He is on losartan 50 mg by mouth daily for hypertension as well as chronic kidney disease. Past Medical History:  Diagnosis Date  . Arthritis   . Chronic bronchitis   . Chronic pain   . CKD (chronic kidney disease) stage 3, GFR 30-59 ml/min   . Diabetes mellitus without complication (HCC)    prediabetes  . GERD (gastroesophageal reflux disease)   . Hyperlipemia   . Hypertension   . Seasonal allergies    Past Surgical History:  Procedure Laterality Date  . ANKLE FRACTURE SURGERY    . EXAMINATION UNDER ANESTHESIA N/A 12/15/2012   Procedure: EXAM UNDER ANESTHESIA;  Surgeon: Atilano InaEric M Wilson, MD;  Location: [facility];  Service: General;  Laterality: N/A;  . FRACTURE SURGERY  2001   lt ankle x2  . HEMORRHOIDECTOMY WITH HEMORRHOID BANDING N/A 12/15/2012   Procedure: EXCISIONAL HEMORRHOIDECTOMY WITH HEMORRHOID BANDING;  Surgeon: Atilano Ina, MD;  Location: [facility];  Service: General;  Laterality: N/A;  . rotator cuff surgery      x 2  . TRIGGER FINGER RELEASE  07/23/2011   Procedure: RELEASE TRIGGER FINGER/A-1 PULLEY;  Surgeon: Wyn Forster., MD;  Location: [facility];  Service: Orthopedics;  Laterality: Right;  . WRIST FRACTURE SURGERY  1984   lt with bone graft  . WRIST SURGERY     Current Outpatient Prescriptions on File Prior to Visit  Medication Sig Dispense Refill  .  aspirin EC 81 MG tablet Take 81 mg by mouth daily.    . Biotin 10 MG TABS Take by mouth.    . calcium carbonate (OS-CAL) 600 MG TABS Take 600 mg by mouth 2 (two) times daily with a meal.    . carvedilol (COREG) 12.5 MG tablet Take 1 tablet (12.5 mg total) by mouth 2 (two) times daily with a meal. 60 tablet 11  . cetirizine (ZYRTEC) 10 MG tablet Take 10 mg by mouth daily.    . Cinnamon 500 MG capsule Take 500 mg by mouth daily.    . fenofibrate 160 MG tablet Take 1 tablet (160 mg total) by mouth daily. 90 tablet 3  . fish oil-omega-3 fatty acids 1000 MG capsule Take 2 g by mouth daily.    . fluticasone (FLONASE) 50 MCG/ACT nasal spray Place 2 sprays into both nostrils daily.     Marland Kitchen glucosamine-chondroitin 500-400 MG tablet Take 1 tablet by mouth 3 (three) times daily.    Marland Kitchen losartan (COZAAR) 50 MG tablet Take 1 tablet (50 mg total) by mouth daily. 90 tablet 3  . LUTEIN PO Take 1 tablet by mouth daily.    . Multiple Vitamin (MULITIVITAMIN WITH MINERALS) TABS Take 1 tablet by mouth daily.    Marland Kitchen omeprazole (PRILOSEC) 20 MG capsule Take 1 capsule (20 mg total) by mouth 2 (two) times daily before a meal. 60 capsule 11  . simvastatin (ZOCOR) 20 MG tablet TAKE ONE TABLET BY MOUTH EVERY EVENING 30 tablet 3   No current facility-administered medications on file prior to visit.    Allergies  Allergen Reactions  . Amlodipine Other (See Comments)    Per pt - damage to kidney's  . Codeine Nausea And Vomiting  . Ivp Dye [Iodinated Diagnostic Agents]   . Penicillins Nausea And Vomiting    As a child  . Sulfa Antibiotics    Social History   Social History  . Marital status: Single    Spouse name: N/A  . Number of children: N/A  . Years of education: N/A   Occupational History  . Not on file.   Social History Main Topics  . Smoking status: Never Smoker  . Smokeless tobacco: Never Used  . Alcohol use No  . Drug use: No  . Sexual activity: Not on file   Other Topics Concern  . Not on file    Social History Narrative  . No narrative on file   Family History  Problem Relation Age of Onset  . Heart disease Mother   . Hyperlipidemia Mother   . Diabetes Mother   . Cancer Father        skin      Review of Systems  All other systems reviewed and are negative.      Objective:   Physical Exam  Constitutional: He is oriented to person, place, and time. He appears well-developed and well-nourished. No distress.  HENT:  Right Ear: External ear normal.  Left Ear: External ear normal.  Nose: Nose normal.  Mouth/Throat: Oropharynx is clear and moist. No oropharyngeal exudate.  Eyes: Pupils are equal, round, and reactive to light. Conjunctivae are normal.  Neck: Neck supple. No JVD present. No thyromegaly present.  Cardiovascular: Normal rate, regular rhythm, normal heart sounds and intact distal pulses.  Exam reveals no gallop and no friction rub.   No murmur heard. Pulmonary/Chest: Effort normal and breath sounds normal. No respiratory distress. He has no wheezes. He has no rales. He exhibits no tenderness.  Abdominal: Soft. Bowel sounds are normal. He exhibits no distension and no mass. There is no tenderness. There is no rebound and no guarding.  Musculoskeletal: Normal range of motion. He exhibits tenderness. He exhibits no edema or deformity.  Lymphadenopathy:    He has no cervical adenopathy.  Neurological: He is alert and oriented to person, place, and time. He has normal reflexes. No cranial nerve deficit. Coordination normal.  Skin: He is not diaphoretic.  Psychiatric: He has a normal mood and affect. His behavior is normal. Judgment and thought content normal.  Vitals reviewed.         Prediabetes - Plan: COMPLETE METABOLIC PANEL WITH GFR, Lipid panel, Hemoglobin A1c  Pure hypercholesterolemia - Plan: COMPLETE METABOLIC PANEL WITH GFR, Lipid panel, Hemoglobin A1c  Benign essential HTN - Plan: COMPLETE METABOLIC PANEL WITH GFR, Lipid panel, Hemoglobin  A1c  Patient's blood pressure today is borderline. If his blood pressure rises any further, I would increase his losartan to 100 mg a day. I will check a fasting lipid panel. Goal LDL cholesterol is less than 100. I will also check a hemoglobin A1c. Goal hemoglobin A1c is less than 6.5. We discussed a low carbohydrate diet as well as 30 minutes a day of aerobic exercise. Screen the patient for HIV and hepatitis C as we discussed. He continues to decline colon cancer screening

## 2016-10-08 ENCOUNTER — Encounter: Payer: Self-pay | Admitting: Family Medicine

## 2016-10-08 LAB — HIV ANTIBODY (ROUTINE TESTING W REFLEX): HIV: NONREACTIVE

## 2016-10-08 LAB — HEMOGLOBIN A1C
Hgb A1c MFr Bld: 6.2 % — ABNORMAL HIGH (ref <5.7)
Mean Plasma Glucose: 131 mg/dL

## 2016-10-08 LAB — HEPATITIS C ANTIBODY: HCV AB: NONREACTIVE

## 2016-12-23 ENCOUNTER — Other Ambulatory Visit: Payer: Self-pay | Admitting: Family Medicine

## 2017-04-11 ENCOUNTER — Other Ambulatory Visit: Payer: 59

## 2017-04-11 ENCOUNTER — Other Ambulatory Visit: Payer: Self-pay

## 2017-04-11 DIAGNOSIS — Z0000 Encounter for general adult medical examination without abnormal findings: Secondary | ICD-10-CM

## 2017-04-14 ENCOUNTER — Encounter: Payer: Self-pay | Admitting: Family Medicine

## 2017-04-14 ENCOUNTER — Other Ambulatory Visit: Payer: Self-pay | Admitting: Family Medicine

## 2017-04-14 ENCOUNTER — Ambulatory Visit (INDEPENDENT_AMBULATORY_CARE_PROVIDER_SITE_OTHER): Payer: 59 | Admitting: Family Medicine

## 2017-04-14 VITALS — BP 130/86 | HR 80 | Temp 97.7°F | Resp 14 | Ht 71.0 in | Wt 238.0 lb

## 2017-04-14 DIAGNOSIS — Z23 Encounter for immunization: Secondary | ICD-10-CM

## 2017-04-14 DIAGNOSIS — K921 Melena: Secondary | ICD-10-CM | POA: Diagnosis not present

## 2017-04-14 DIAGNOSIS — Z125 Encounter for screening for malignant neoplasm of prostate: Secondary | ICD-10-CM

## 2017-04-14 DIAGNOSIS — E7800 Pure hypercholesterolemia, unspecified: Secondary | ICD-10-CM

## 2017-04-14 DIAGNOSIS — I10 Essential (primary) hypertension: Secondary | ICD-10-CM

## 2017-04-14 DIAGNOSIS — Z0000 Encounter for general adult medical examination without abnormal findings: Secondary | ICD-10-CM | POA: Diagnosis not present

## 2017-04-14 DIAGNOSIS — E119 Type 2 diabetes mellitus without complications: Secondary | ICD-10-CM

## 2017-04-14 LAB — TEST AUTHORIZATION

## 2017-04-14 LAB — COMPLETE METABOLIC PANEL WITH GFR
AG Ratio: 1.6 (calc) (ref 1.0–2.5)
ALT: 22 U/L (ref 9–46)
AST: 17 U/L (ref 10–35)
Albumin: 4 g/dL (ref 3.6–5.1)
Alkaline phosphatase (APISO): 49 U/L (ref 40–115)
BUN/Creatinine Ratio: 15 (calc) (ref 6–22)
BUN: 20 mg/dL (ref 7–25)
CALCIUM: 9.4 mg/dL (ref 8.6–10.3)
CHLORIDE: 106 mmol/L (ref 98–110)
CO2: 26 mmol/L (ref 20–32)
Creat: 1.34 mg/dL — ABNORMAL HIGH (ref 0.70–1.33)
GFR, EST AFRICAN AMERICAN: 69 mL/min/{1.73_m2} (ref >=60)
GFR, EST NON AFRICAN AMERICAN: 59 mL/min/{1.73_m2} — AB (ref >=60)
GLUCOSE: 119 mg/dL — AB (ref 65–99)
Globulin: 2.5 g/dL (calc) (ref 1.9–3.7)
Potassium: 4.5 mmol/L (ref 3.5–5.3)
Sodium: 140 mmol/L (ref 135–146)
TOTAL PROTEIN: 6.5 g/dL (ref 6.1–8.1)
Total Bilirubin: 0.4 mg/dL (ref 0.2–1.2)

## 2017-04-14 LAB — CBC WITH DIFFERENTIAL/PLATELET
BASOS ABS: 52 {cells}/uL (ref 0–200)
Basophils Relative: 1 %
EOS PCT: 4 %
Eosinophils Absolute: 208 cells/uL (ref 15–500)
HEMATOCRIT: 41.4 % (ref 38.5–50.0)
HEMOGLOBIN: 14.1 g/dL (ref 13.2–17.1)
LYMPHS ABS: 1898 {cells}/uL (ref 850–3900)
MCH: 27.9 pg (ref 27.0–33.0)
MCHC: 34.1 g/dL (ref 32.0–36.0)
MCV: 81.8 fL (ref 80.0–100.0)
MPV: 9.5 fL (ref 7.5–12.5)
Monocytes Relative: 8.4 %
NEUTROS ABS: 2605 {cells}/uL (ref 1500–7800)
Neutrophils Relative %: 50.1 %
Platelets: 282 10*3/uL (ref 140–400)
RBC: 5.06 10*6/uL (ref 4.20–5.80)
RDW: 13.2 % (ref 11.0–15.0)
Total Lymphocyte: 36.5 %
WBC mixed population: 437 cells/uL (ref 200–950)
WBC: 5.2 10*3/uL (ref 3.8–10.8)

## 2017-04-14 LAB — LIPID PANEL
Cholesterol: 153 mg/dL (ref <200)
HDL: 25 mg/dL — ABNORMAL LOW (ref >40)
LDL Cholesterol (Calc): 101 mg/dL (calc) — ABNORMAL HIGH
NON-HDL CHOLESTEROL (CALC): 128 mg/dL (ref <130)
Total CHOL/HDL Ratio: 6.1 (calc) — ABNORMAL HIGH (ref <5.0)
Triglycerides: 162 mg/dL — ABNORMAL HIGH (ref <150)

## 2017-04-14 LAB — HEMOGLOBIN A1C
Hgb A1c MFr Bld: 6.7 % of total Hgb — ABNORMAL HIGH (ref <5.7)
Mean Plasma Glucose: 146 (calc)
eAG (mmol/L): 8.1 (calc)

## 2017-04-14 LAB — PSA: PSA: 0.3 ng/mL (ref <=4.0)

## 2017-04-14 NOTE — Progress Notes (Signed)
Subjective:    Patient ID: Shawn Ban., male    DOB: 09/18/1960, 57 y.o.   MRN: [id number]  HPI Patient is here today for CPE.  He recently was let go by the city of [place] due to chronic orthopedic issues. Apparently the patient has "25% disability" due to limitations stemming from her right shoulder injury. He reports that he's had rotator cuff repair 2. He also has 25% disability due to chronic pain in his right ankle. Apparently he fractured his ankle in the past requiring surgery. 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 This causes restrictions on what work he can perform prompting him to seek disability. According to the patient this causes the city to let him go. He also has a history of prediabetes, hyperlipidemia, and hypertension.  Since I last saw the patient, he is experienced one episode of bright red blood per rectum.  This was associated with cramping lower abdominal pain located in the suprapubic area.  Symptoms resolve gradually over 2 days and have not occurred since.  However he is now willing to proceed with a colonoscopy to be screen for colon cancer.  His most recent lab work as listed below.:  Appointment on 04/11/2017  Component Date Value Ref Range Status  . Cholesterol 04/11/2017 153  <200 mg/dL Final  . HDL 40/98/1191 25* >40 mg/dL Final  . Triglycerides 04/11/2017 162* <150 mg/dL Final  . LDL Cholesterol (Calc) 04/11/2017 101* mg/dL (calc) Final   Comment: Reference range: <100 . Desirable range <100 mg/dL for primary prevention;   <70 mg/dL for patients with CHD or diabetic patients  with > or = 2 CHD risk factors. Marland Kitchen LDL-C is now calculated using the Martin-Hopkins  calculation, which is a validated novel method providing  better accuracy than the Friedewald equation in the  estimation of LDL-C.  Horald Pollen et al. Lenox Ahr. 4782;956(21): 2061-2068  (http://education.[facility].com/faq/FAQ164)     . Total CHOL/HDL Ratio 04/11/2017 6.1* <3.0 (calc) Final  . Non-HDL Cholesterol (Calc) 04/11/2017 128  <130 mg/dL (calc) Final   Comment: For patients with diabetes plus 1 major ASCVD risk  factor, treating to a non-HDL-C goal of <100 mg/dL  (LDL-C of <86 mg/dL) is considered a therapeutic  option.   . Glucose, Bld 04/11/2017 119* 65 - 99 mg/dL Final   Comment: .            Fasting reference interval . For someone without known diabetes, a glucose value between 100 and 125 mg/dL is consistent with prediabetes and should be confirmed with a follow-up test. .   . BUN 04/11/2017 20  7 - 25 mg/dL Final  . Creat 57/84/6962 1.34* 0.70 - 1.33 mg/dL Final   Comment: For patients >75 years of age, the reference limit for Creatinine is approximately 13% higher for people identified as African-American. .   . GFR, Est Non African American 04/11/2017 59* > OR = 60 mL/min/1.43m2 Final  . GFR, Est African American 04/11/2017 69  > OR = 60 mL/min/1.53m2 Final  . BUN/Creatinine Ratio 04/11/2017 15  6 - 22 (calc) Final  . Sodium 04/11/2017 140  135 - 146 mmol/L Final  . Potassium 04/11/2017 4.5  3.5 - 5.3 mmol/L Final  . Chloride 04/11/2017 106  98 - 110 mmol/L Final  . CO2 04/11/2017 26  20 - 32 mmol/L Final  . Calcium 04/11/2017 9.4  8.6 - 10.3 mg/dL Final  . Total Protein  04/11/2017 6.5  6.1 - 8.1 g/dL Final  . Albumin 16/10/960402/10/2017 4.0  3.6 - 5.1 g/dL Final  . Globulin 54/09/811902/10/2017 2.5  1.9 - 3.7 g/dL (calc) Final  . AG Ratio 04/11/2017 1.6  1.0 - 2.5 (calc) Final  . Total Bilirubin 04/11/2017 0.4  0.2 - 1.2 mg/dL Final  . Alkaline phosphatase (APISO) 04/11/2017 49  40 - 115 U/L Final  . AST 04/11/2017 17  10 - 35 U/L Final  . ALT 04/11/2017 22  9 - 46 U/L Final  . WBC 04/11/2017 5.2  3.8 - 10.8 Thousand/uL Final  . RBC 04/11/2017 5.06  4.20 - 5.80 Million/uL Final  . Hemoglobin 04/11/2017 14.1  13.2 - 17.1 g/dL Final  . HCT 14/78/295602/10/2017 41.4  38.5 - 50.0 % Final  . MCV 04/11/2017 81.8   80.0 - 100.0 fL Final  . MCH 04/11/2017 27.9  27.0 - 33.0 pg Final  . MCHC 04/11/2017 34.1  32.0 - 36.0 g/dL Final  . RDW 21/30/865702/10/2017 13.2  11.0 - 15.0 % Final  . Platelets 04/11/2017 282  140 - 400 Thousand/uL Final  . MPV 04/11/2017 9.5  7.5 - 12.5 fL Final  . Neutro Abs 04/11/2017 2,605  1,500 - 7,800 cells/uL Final  . Lymphs Abs 04/11/2017 1,898  850 - 3,900 cells/uL Final  . WBC mixed population 04/11/2017 437  200 - 950 cells/uL Final  . Eosinophils Absolute 04/11/2017 208  15 - 500 cells/uL Final  . Basophils Absolute 04/11/2017 52  0 - 200 cells/uL Final  . Neutrophils Relative % 04/11/2017 50.1  % Final  . Total Lymphocyte 04/11/2017 36.5  % Final  . Monocytes Relative 04/11/2017 8.4  % Final  . Eosinophils Relative 04/11/2017 4.0  % Final  . Basophils Relative 04/11/2017 1.0  % Final  . Hgb A1c MFr Bld 04/11/2017 6.7* <5.7 % of total Hgb Final   Comment: For someone without known diabetes, a hemoglobin A1c value of 6.5% or greater indicates that they may have  diabetes and this should be confirmed with a follow-up  test. . For someone with known diabetes, a value <7% indicates  that their diabetes is well controlled and a value  greater than or equal to 7% indicates suboptimal  control. A1c targets should be individualized based on  duration of diabetes, age, comorbid conditions, and  other considerations. . Currently, no consensus exists regarding use of hemoglobin A1c for diagnosis of diabetes for children. .   . Mean Plasma Glucose 04/11/2017 146  (calc) Final  . eAG (mmol/L) 04/11/2017 8.1  (calc) Final   Patient is due for a flu shot, shingles shot, and a pneumonia vaccine.  He refuses the flu shot in the shin but will begrudgingly consent to Pneumovax 23. Past Medical History:  Diagnosis Date  . Arthritis   . Chronic bronchitis   . Chronic pain   . CKD (chronic kidney disease) stage 3, GFR 30-59 ml/min (HCC)   . Diabetes mellitus without complication (HCC)     prediabetes  . GERD (gastroesophageal reflux disease)   . Hyperlipemia   . Hypertension   . Seasonal allergies    Past Surgical History:  Procedure Laterality Date  . ANKLE FRACTURE SURGERY    . EXAMINATION UNDER ANESTHESIA N/A 12/15/2012   Procedure: EXAM UNDER ANESTHESIA;  Surgeon: Atilano InaEric M Wilson, MD;  Location: [facility];  Service: General;  Laterality: N/A;  . FRACTURE SURGERY  2001   lt ankle x2  . HEMORRHOIDECTOMY WITH HEMORRHOID  BANDING N/A 12/15/2012   Procedure: EXCISIONAL HEMORRHOIDECTOMY WITH HEMORRHOID BANDING;  Surgeon: Atilano Ina, MD;  Location: [facility];  Service: General;  Laterality: N/A;  . rotator cuff surgery      x 2  . TRIGGER FINGER RELEASE  07/23/2011   Procedure: RELEASE TRIGGER FINGER/A-1 PULLEY;  Surgeon: Wyn Forster., MD;  Location: [facility];  Service: Orthopedics;  Laterality: Right;  . WRIST FRACTURE SURGERY  1984   lt with bone graft  . WRIST SURGERY     Current Outpatient Medications on File Prior to Visit  Medication Sig Dispense Refill  . aspirin EC 81 MG tablet Take 81 mg by mouth daily.    . Biotin 10 MG TABS Take by mouth.    . calcium carbonate (OS-CAL) 600 MG TABS Take 600 mg by mouth 2 (two) times daily with a meal.    . carvedilol (COREG) 12.5 MG tablet Take 1 tablet (12.5 mg total) by mouth 2 (two) times daily with a meal. 60 tablet 11  . cetirizine (ZYRTEC) 10 MG tablet Take 10 mg by mouth daily.    . Cinnamon 500 MG capsule Take 500 mg by mouth daily.    . fish oil-omega-3 fatty acids 1000 MG capsule Take 2 g by mouth daily.    . fluticasone (FLONASE) 50 MCG/ACT nasal spray Place 2 sprays into both nostrils daily.     Marland Kitchen glucosamine-chondroitin 500-400 MG tablet Take 1 tablet by mouth 3 (three) times daily.    Marland Kitchen losartan (COZAAR) 50 MG tablet Take 1 tablet (50 mg total) by mouth daily. 90 tablet 3  . LUTEIN PO Take 1 tablet by mouth daily.    . Multiple Vitamin (MULITIVITAMIN  WITH MINERALS) TABS Take 1 tablet by mouth daily.    Marland Kitchen omeprazole (PRILOSEC) 20 MG capsule Take 1 capsule (20 mg total) by mouth 2 (two) times daily before a meal. 60 capsule 11  . simvastatin (ZOCOR) 20 MG tablet TAKE 1 TABLET BY MOUTH EVERY  EVENING 30 tablet 3   No current facility-administered medications on file prior to visit.    Allergies  Allergen Reactions  . Amlodipine Other (See Comments)    Per pt - damage to kidney's  . Codeine Nausea And Vomiting  . Ivp Dye [Iodinated Diagnostic Agents]   . Penicillins Nausea And Vomiting    As a child  . Sulfa Antibiotics    Social History   Socioeconomic History  . Marital status: Single    Spouse name: Not on file  . Number of children: Not on file  . Years of education: Not on file  . Highest education level: Not on file  Social Needs  . Financial resource strain: Not on file  . Food insecurity - worry: Not on file  . Food insecurity - inability: Not on file  . Transportation needs - medical: Not on file  . Transportation needs - non-medical: Not on file  Occupational History  . Not on file  Tobacco Use  . Smoking status: Never Smoker  . Smokeless tobacco: Never Used  Substance and Sexual Activity  . Alcohol use: No  . Drug use: No  . Sexual activity: Not on file  Other Topics Concern  . Not on file  Social History Narrative  . Not on file   Family History  Problem Relation Age of Onset  . Heart disease Mother   . Hyperlipidemia Mother   . Diabetes Mother   .  Cancer Father        skin      Review of Systems  All other systems reviewed and are negative.      Objective:   Physical Exam  Constitutional: He is oriented to person, place, and time. He appears well-developed and well-nourished. No distress.  HENT:  Right Ear: External ear normal.  Left Ear: External ear normal.  Nose: Nose normal.  Mouth/Throat: Oropharynx is clear and moist. No oropharyngeal exudate.  Eyes: Conjunctivae are normal.  Pupils are equal, round, and reactive to light.  Neck: Neck supple. No JVD present. No thyromegaly present.  Cardiovascular: Normal rate, regular rhythm, normal heart sounds and intact distal pulses. Exam reveals no gallop and no friction rub.  No murmur heard. Pulmonary/Chest: Effort normal and breath sounds normal. No respiratory distress. He has no wheezes. He has no rales. He exhibits no tenderness.  Abdominal: Soft. Bowel sounds are normal. He exhibits no distension and no mass. There is no tenderness. There is no rebound and no guarding.  Musculoskeletal: Normal range of motion. He exhibits tenderness. He exhibits no edema or deformity.  Lymphadenopathy:    He has no cervical adenopathy.  Neurological: He is alert and oriented to person, place, and time. He has normal reflexes. No cranial nerve deficit. Coordination normal.  Skin: He is not diaphoretic.  Psychiatric: He has a normal mood and affect. His behavior is normal. Judgment and thought content normal.  Vitals reviewed.         General medical exam - Plan: Ambulatory referral to Gastroenterology  Prostate cancer screening  Pure hypercholesterolemia  Benign essential HTN  Controlled type 2 diabetes mellitus without complication, without long-term current use of insulin (HCC)  Blood in stool - Plan: Ambulatory referral to Gastroenterology   Given his age and the blood in his stool, I will consult GI for a colonoscopy.  I will obtain a PSA today to screen for prostate cancer.  He refuses a flu shot and the shingles shot but he will consent to Pneumovax 23.  His blood pressure today is acceptable at 130/86.  His hemoglobin A1c has risen to 6.7 and is no longer well controlled.  I have encouraged increasing aerobic exercise to address both his elevated hemoglobin A1c and also his significant dyslipidemia with his HDL cholesterol at 24-25.  The remainder of his lab work is normal.  Recheck lab work in 3-6 months with aggressive  lifestyle changes

## 2017-04-14 NOTE — Addendum Note (Signed)
Addended by: Legrand RamsWILLIS, SANDY B on: 04/14/2017 02:05 PM   Modules accepted: Orders

## 2017-04-15 ENCOUNTER — Encounter: Payer: Self-pay | Admitting: Family Medicine

## 2017-04-16 ENCOUNTER — Encounter: Payer: Self-pay | Admitting: Gastroenterology

## 2017-04-21 ENCOUNTER — Telehealth: Payer: Self-pay | Admitting: Family Medicine

## 2017-04-21 ENCOUNTER — Other Ambulatory Visit: Payer: Self-pay | Admitting: Family Medicine

## 2017-04-21 MED ORDER — CARVEDILOL 12.5 MG PO TABS: 12.5000 mg | ORAL_TABLET | Freq: Two times a day (BID) | ORAL | 3 refills | Status: DC

## 2017-04-21 MED ORDER — FLUTICASONE PROPIONATE 50 MCG/ACT NA SUSP: 2.0000 | Freq: Every day | NASAL | 5 refills | Status: DC

## 2017-04-21 MED ORDER — LOSARTAN POTASSIUM 50 MG PO TABS: 50.0000 mg | ORAL_TABLET | Freq: Every day | ORAL | 3 refills | Status: DC

## 2017-04-21 MED ORDER — CETIRIZINE HCL 10 MG PO TABS: 10.0000 mg | ORAL_TABLET | Freq: Every day | ORAL | 3 refills | Status: DC

## 2017-04-21 MED ORDER — OMEPRAZOLE 20 MG PO CPDR: 20.0000 mg | DELAYED_RELEASE_CAPSULE | Freq: Two times a day (BID) | ORAL | 3 refills | Status: DC

## 2017-04-21 NOTE — Telephone Encounter (Signed)
Medication called/sent to requested pharmacy  

## 2017-04-21 NOTE — Telephone Encounter (Signed)
Refill on coreg, zyrtec, fenofibrate, flonase, omeprazole, simvastatin, and losartan to [facility].

## 2017-05-15 ENCOUNTER — Ambulatory Visit (INDEPENDENT_AMBULATORY_CARE_PROVIDER_SITE_OTHER): Payer: 59 | Admitting: Family Medicine

## 2017-05-15 ENCOUNTER — Encounter: Payer: Self-pay | Admitting: Family Medicine

## 2017-05-15 VITALS — BP 128/90 | HR 84 | Temp 98.4°F | Resp 16 | Ht 71.0 in | Wt 235.0 lb

## 2017-05-15 DIAGNOSIS — L259 Unspecified contact dermatitis, unspecified cause: Secondary | ICD-10-CM

## 2017-05-15 MED ORDER — MOMETASONE FUROATE 0.1 % EX CREA: 1.0000 "application " | TOPICAL_CREAM | Freq: Every day | CUTANEOUS | 0 refills | Status: DC

## 2017-05-15 NOTE — Progress Notes (Signed)
Subjective:    Patient ID: Shawn BanJoseph R Oguin Jr., male    DOB: 29-Sep-1960, 57 y.o.   MRN: [id number]  HPI  Patient thinks he may have shingles.  He has a fever blister on the right side of his lower lip.  He has a linear rash on the medial surface of his left bicep.  Rash consist of 3 separate patches of small 1-2 mm erythematous papules without vesicles.  It itches.  However it looks more like hives or papular urticaria then a vesicular rash.  Furthermore he has a similar rash in the webspace between his left second and third finger that occurred around the same time.  He is also itching on his lower right chin although there is no visible rash there Past Medical History:  Diagnosis Date  . Arthritis   . Chronic bronchitis   . Chronic pain   . CKD (chronic kidney disease) stage 3, GFR 30-59 ml/min (HCC)   . Diabetes mellitus without complication (HCC)    prediabetes  . GERD (gastroesophageal reflux disease)   . Hyperlipemia   . Hypertension   . Seasonal allergies    Past Surgical History:  Procedure Laterality Date  . ANKLE FRACTURE SURGERY    . EXAMINATION UNDER ANESTHESIA N/A 12/15/2012   Procedure: EXAM UNDER ANESTHESIA;  Surgeon: Atilano InaEric M Wilson, MD;  Location: [facility];  Service: General;  Laterality: N/A;  . FRACTURE SURGERY  2001   lt ankle x2  . HEMORRHOIDECTOMY WITH HEMORRHOID BANDING N/A 12/15/2012   Procedure: EXCISIONAL HEMORRHOIDECTOMY WITH HEMORRHOID BANDING;  Surgeon: Atilano InaEric M Wilson, MD;  Location: [facility];  Service: General;  Laterality: N/A;  . rotator cuff surgery      x 2  . TRIGGER FINGER RELEASE  07/23/2011   Procedure: RELEASE TRIGGER FINGER/A-1 PULLEY;  Surgeon: Wyn Forsterobert V Sypher Jr., MD;  Location: [facility];  Service: Orthopedics;  Laterality: Right;  . WRIST FRACTURE SURGERY  1984   lt with bone graft  . WRIST SURGERY     Current Outpatient Medications on File Prior to Visit  Medication Sig Dispense Refill   . aspirin EC 81 MG tablet Take 81 mg by mouth daily.    . Biotin 10 MG TABS Take by mouth.    . calcium carbonate (OS-CAL) 600 MG TABS Take 600 mg by mouth 2 (two) times daily with a meal.    . carvedilol (COREG) 12.5 MG tablet Take 1 tablet (12.5 mg total) by mouth 2 (two) times daily with a meal. 180 tablet 3  . cetirizine (ZYRTEC) 10 MG tablet Take 1 tablet (10 mg total) by mouth daily. 90 tablet 3  . Cinnamon 500 MG capsule Take 500 mg by mouth daily.    . fenofibrate 160 MG tablet TAKE 1 TABLET BY MOUTH  DAILY 90 tablet 3  . fish oil-omega-3 fatty acids 1000 MG capsule Take 2 g by mouth daily.    . fluticasone (FLONASE) 50 MCG/ACT nasal spray Place 2 sprays into both nostrils daily. 16 g 5  . glucosamine-chondroitin 500-400 MG tablet Take 1 tablet by mouth 3 (three) times daily.    Marland Kitchen. losartan (COZAAR) 50 MG tablet Take 1 tablet (50 mg total) by mouth daily. 90 tablet 3  . LUTEIN PO Take 1 tablet by mouth daily.    . Multiple Vitamin (MULITIVITAMIN WITH MINERALS) TABS Take 1 tablet by mouth daily.    Marland Kitchen. omeprazole (PRILOSEC) 20 MG capsule Take 1 capsule (20  mg total) by mouth 2 (two) times daily before a meal. 180 capsule 3  . simvastatin (ZOCOR) 20 MG tablet TAKE 1 TABLET BY MOUTH IN THE EVENING 90 tablet 1   No current facility-administered medications on file prior to visit.    Allergies  Allergen Reactions  . Amlodipine Other (See Comments)    Per pt - damage to kidney's  . Codeine Nausea And Vomiting  . Ivp Dye [Iodinated Diagnostic Agents]   . Penicillins Nausea And Vomiting    As a child  . Sulfa Antibiotics    Social History   Socioeconomic History  . Marital status: Single    Spouse name: Not on file  . Number of children: Not on file  . Years of education: Not on file  . Highest education level: Not on file  Social Needs  . Financial resource strain: Not on file  . Food insecurity - worry: Not on file  . Food insecurity - inability: Not on file  . Transportation  needs - medical: Not on file  . Transportation needs - non-medical: Not on file  Occupational History  . Not on file  Tobacco Use  . Smoking status: Never Smoker  . Smokeless tobacco: Never Used  Substance and Sexual Activity  . Alcohol use: No  . Drug use: No  . Sexual activity: Not on file  Other Topics Concern  . Not on file  Social History Narrative  . Not on file     Review of Systems  All other systems reviewed and are negative.      Objective:   Physical Exam  Cardiovascular: Normal rate, regular rhythm and normal heart sounds.  Pulmonary/Chest: Effort normal and breath sounds normal.  Skin: Rash noted. Rash is papular and maculopapular. Rash is not pustular and not vesicular.     Vitals reviewed.         Assessment & Plan:  Contact dermatitis, unspecified contact dermatitis type, unspecified trigger  Given the varied locations and the clinical appearance, I believe shingles is unlikely or less likely.  I believe this may be a contact dermatitis.  I would treat with Elocon cream applied once daily to the affected areas.  Should improve by Monday.  If rash worsens or begins to take on a more vesicular appearance (I showed the patient multiple examples of shingles) he will call me back and I will quickly add Valtrex to his treatment regimen.

## 2017-05-22 DIAGNOSIS — Z23 Encounter for immunization: Secondary | ICD-10-CM | POA: Diagnosis not present

## 2017-05-28 ENCOUNTER — Encounter: Payer: Self-pay | Admitting: Gastroenterology

## 2017-05-28 ENCOUNTER — Telehealth: Payer: Self-pay | Admitting: Gastroenterology

## 2017-05-28 ENCOUNTER — Ambulatory Visit (AMBULATORY_SURGERY_CENTER): Payer: Self-pay

## 2017-05-28 ENCOUNTER — Other Ambulatory Visit: Payer: Self-pay

## 2017-05-28 VITALS — Ht 71.0 in | Wt 237.0 lb

## 2017-05-28 DIAGNOSIS — Z1211 Encounter for screening for malignant neoplasm of colon: Secondary | ICD-10-CM

## 2017-05-28 MED ORDER — PEG-KCL-NACL-NASULF-NA ASC-C 140 G PO SOLR: 1.0000 | Freq: Once | ORAL | Status: AC

## 2017-05-28 NOTE — Telephone Encounter (Signed)
Plenvu bowel prep, one kit was called into [facility] in [place]. Left message for [name]

## 2017-05-28 NOTE — Progress Notes (Signed)
Per pt, no allergies to soy or egg products.Pt not taking any weight loss meds or using  O2 at home.  Pt refused emmi video. 

## 2017-06-11 ENCOUNTER — Encounter: Payer: Self-pay | Admitting: Gastroenterology

## 2017-06-11 ENCOUNTER — Ambulatory Visit (AMBULATORY_SURGERY_CENTER): Payer: 59 | Admitting: Gastroenterology

## 2017-06-11 ENCOUNTER — Other Ambulatory Visit: Payer: Self-pay

## 2017-06-11 VITALS — BP 126/81 | HR 74 | Temp 98.0°F | Resp 11 | Ht 71.0 in | Wt 237.0 lb

## 2017-06-11 DIAGNOSIS — Z1211 Encounter for screening for malignant neoplasm of colon: Secondary | ICD-10-CM | POA: Diagnosis present

## 2017-06-11 DIAGNOSIS — Z1212 Encounter for screening for malignant neoplasm of rectum: Secondary | ICD-10-CM | POA: Diagnosis not present

## 2017-06-11 MED ORDER — SODIUM CHLORIDE 0.9 % IV SOLN: 500.0000 mL | Freq: Once | INTRAVENOUS | Status: DC

## 2017-06-11 NOTE — Patient Instructions (Signed)
YOU HAD AN ENDOSCOPIC PROCEDURE TODAY AT THE [facility]:   Refer to the procedure report that was given to you for any specific questions about what was found during the examination.  If the procedure report does not answer your questions, please call your gastroenterologist to clarify.  If you requested that your care partner not be given the details of your procedure findings, then the procedure report has been included in a sealed envelope for you to review at your convenience later.  YOU SHOULD EXPECT: Some feelings of bloating in the abdomen. Passage of more gas than usual.  Walking can help get rid of the air that was put into your GI tract during the procedure and reduce the bloating. If you had a lower endoscopy (such as a colonoscopy or flexible sigmoidoscopy) you may notice spotting of blood in your stool or on the toilet paper. If you underwent a bowel prep for your procedure, you may not have a normal bowel movement for a few days.  Please Note:  You might notice some irritation and congestion in your nose or some drainage.  This is from the oxygen used during your procedure.  There is no need for concern and it should clear up in a day or so.  SYMPTOMS TO REPORT IMMEDIATELY:   Following lower endoscopy (colonoscopy or flexible sigmoidoscopy):  Excessive amounts of blood in the stool  Significant tenderness or worsening of abdominal pains  Swelling of the abdomen that is new, acute  Fever of 100F or higher  For urgent or emergent issues, a gastroenterologist can be reached at any hour by calling [phone].   DIET:  We do recommend a small meal at first, but then you may proceed to your regular diet.  Drink plenty of fluids but you should avoid alcoholic beverages for 24 hours.  ACTIVITY:  You should plan to take it easy for the rest of today and you should NOT DRIVE or use heavy machinery until tomorrow (because of the sedation medicines used during the test).     FOLLOW UP: Our staff will call the number listed on your records the next business day following your procedure to check on you and address any questions or concerns that you may have regarding the information given to you following your procedure. If we do not reach you, we will leave a message.  However, if you are feeling well and you are not experiencing any problems, there is no need to return our call.  We will assume that you have returned to your regular daily activities without incident.  If any biopsies were taken you will be contacted by phone or by letter within the next 1-3 weeks.  Please call us at [phone] if you have not heard about the biopsies in 3 weeks.   Repeat Colonoscopy screening in 10 years. Diverticulosis (handoutgiven)  SIGNATURES/CONFIDENTIALITY: You and/or your care partner have signed paperwork which will be entered into your electronic medical record.  These signatures attest to the fact that that the information above on your After Visit Summary has been reviewed and is understood.  Full responsibility of the confidentiality of this discharge information lies with you and/or your care-partner. 

## 2017-06-11 NOTE — Op Note (Signed)
[facility] Patient Name: Shawn FailJoseph Kathol Procedure Date: 06/11/2017 8:42 AM MRN: [id number] Endoscopist: Sherilyn CooterHenry L. Myrtie Neitheranis , MD Age: 57 Referring MD:  Date of Birth: [birth year] Gender: Male Account #: [id number] Procedure:                Colonoscopy Indications:              Screening for colorectal malignant neoplasm, This                            is the patient's first colonoscopy Medicines:                Monitored Anesthesia Care Procedure:                Pre-Anesthesia Assessment:                           - Prior to the procedure, a History and Physical                            was performed, and patient medications and                            allergies were reviewed. The patient's tolerance of                            previous anesthesia was also reviewed. The risks                            and benefits of the procedure and the sedation                            options and risks were discussed with the patient.                            All questions were answered, and informed consent                            was obtained. Anticoagulants: The patient has taken                            aspirin. It was decided not to withhold this                            medication prior to the procedure. ASA Grade                            Assessment: II - A patient with mild systemic                            disease. After reviewing the risks and benefits,                            the patient was deemed in satisfactory condition to  undergo the procedure.                           After obtaining informed consent, the colonoscope                            was passed under direct vision. Throughout the                            procedure, the patient's blood pressure, pulse, and                            oxygen saturations were monitored continuously. The                            Colonoscope was introduced through the anus and                           advanced to the the cecum, identified by                            appendiceal orifice and ileocecal valve. The                            colonoscopy was performed without difficulty. The                            patient tolerated the procedure well. The quality                            of the bowel preparation was excellent. The                            ileocecal valve, appendiceal orifice, and rectum                            were photographed. The quality of the bowel                            preparation was evaluated using the BBPS [facility]                            Bowel Preparation Scale) with scores of: Right                            Colon = 3, Transverse Colon = 3 and Left Colon = 3                            (entire mucosa seen well with no residual staining,                            small fragments of stool or opaque liquid). The  total BBPS score equals 9. Scope In: 8:51:31 AM Scope Out: 9:10:21 AM Scope Withdrawal Time: 0 hours 13 minutes 34 seconds  Total Procedure Duration: 0 hours 18 minutes 50 seconds  Findings:                 The perianal and digital rectal examinations were                            normal.                           Multiple small-mouthed diverticula were found in                            the left colon and right colon.                           The exam was otherwise without abnormality on                            direct and retroflexion views. Complications:            No immediate complications. Estimated Blood Loss:     Estimated blood loss: none. Impression:               - Diverticulosis in the left colon and in the right                            colon.                           - The examination was otherwise normal on direct                            and retroflexion views.                           - No specimens collected. Recommendation:           - Patient has a  contact number available for                            emergencies. The signs and symptoms of potential                            delayed complications were discussed with the                            patient. Return to normal activities tomorrow.                            Written discharge instructions were provided to the                            patient.                           - Resume previous diet.                           -  Continue present medications.                           - Repeat colonoscopy in 10 years for screening                            purposes. Akari Crysler L. Myrtie Neither, MD 06/11/2017 9:12:45 AM This report has been signed electronically.

## 2017-06-11 NOTE — Progress Notes (Signed)
A/ox3 pleased with MAC, report to Michelle RN 

## 2017-06-11 NOTE — Progress Notes (Signed)
Pt's states no medical or surgical changes since previsit or office visit. 

## 2017-06-12 ENCOUNTER — Telehealth: Payer: Self-pay | Admitting: *Deleted

## 2017-06-12 NOTE — Telephone Encounter (Signed)
  Follow up Call-  Call back number 06/11/2017  Post procedure Call Back phone  # [phone]  Permission to leave phone message Yes  Some recent data might be hidden     Patient questions:  Do you have a fever, pain , or abdominal swelling? No. Pain Score  0 *  Have you tolerated food without any problems? Yes.    Have you been able to return to your normal activities? Yes.    Do you have any questions about your discharge instructions: Diet   No. Medications  No. Follow up visit  No.  Do you have questions or concerns about your Care? No.  Actions: * If pain score is 4 or above: No action needed, pain <4.

## 2017-07-14 ENCOUNTER — Other Ambulatory Visit: Payer: 59

## 2017-07-14 DIAGNOSIS — E7800 Pure hypercholesterolemia, unspecified: Secondary | ICD-10-CM | POA: Diagnosis not present

## 2017-07-14 DIAGNOSIS — I10 Essential (primary) hypertension: Secondary | ICD-10-CM

## 2017-07-14 DIAGNOSIS — E119 Type 2 diabetes mellitus without complications: Secondary | ICD-10-CM

## 2017-07-14 DIAGNOSIS — Z79899 Other long term (current) drug therapy: Secondary | ICD-10-CM

## 2017-07-15 ENCOUNTER — Encounter (INDEPENDENT_AMBULATORY_CARE_PROVIDER_SITE_OTHER): Payer: Self-pay

## 2017-07-15 LAB — COMPREHENSIVE METABOLIC PANEL
AG Ratio: 1.8 (calc) (ref 1.0–2.5)
ALBUMIN MSPROF: 4.2 g/dL (ref 3.6–5.1)
ALKALINE PHOSPHATASE (APISO): 49 U/L (ref 40–115)
ALT: 19 U/L (ref 9–46)
AST: 18 U/L (ref 10–35)
BILIRUBIN TOTAL: 0.5 mg/dL (ref 0.2–1.2)
BUN/Creatinine Ratio: 18 (calc) (ref 6–22)
BUN: 27 mg/dL — ABNORMAL HIGH (ref 7–25)
CALCIUM: 9.6 mg/dL (ref 8.6–10.3)
CHLORIDE: 105 mmol/L (ref 98–110)
CO2: 28 mmol/L (ref 20–32)
Creat: 1.53 mg/dL — ABNORMAL HIGH (ref 0.70–1.33)
GLOBULIN: 2.4 g/dL (ref 1.9–3.7)
Glucose, Bld: 118 mg/dL — ABNORMAL HIGH (ref 65–99)
POTASSIUM: 4.7 mmol/L (ref 3.5–5.3)
Sodium: 141 mmol/L (ref 135–146)
Total Protein: 6.6 g/dL (ref 6.1–8.1)

## 2017-07-15 LAB — CBC WITH DIFFERENTIAL/PLATELET
Basophils Absolute: 42 cells/uL (ref 0–200)
Basophils Relative: 0.8 %
EOS PCT: 4.8 %
Eosinophils Absolute: 250 cells/uL (ref 15–500)
HCT: 42.1 % (ref 38.5–50.0)
HEMOGLOBIN: 14.1 g/dL (ref 13.2–17.1)
LYMPHS ABS: 1992 {cells}/uL (ref 850–3900)
MCH: 27.8 pg (ref 27.0–33.0)
MCHC: 33.5 g/dL (ref 32.0–36.0)
MCV: 83 fL (ref 80.0–100.0)
MPV: 10 fL (ref 7.5–12.5)
Monocytes Relative: 8.7 %
NEUTROS ABS: 2465 {cells}/uL (ref 1500–7800)
Neutrophils Relative %: 47.4 %
Platelets: 260 10*3/uL (ref 140–400)
RBC: 5.07 10*6/uL (ref 4.20–5.80)
RDW: 13.1 % (ref 11.0–15.0)
Total Lymphocyte: 38.3 %
WBC mixed population: 452 cells/uL (ref 200–950)
WBC: 5.2 10*3/uL (ref 3.8–10.8)

## 2017-07-15 LAB — LIPID PANEL
CHOL/HDL RATIO: 5.4 (calc) — AB (ref <5.0)
CHOLESTEROL: 135 mg/dL (ref <200)
HDL: 25 mg/dL — ABNORMAL LOW (ref >40)
LDL Cholesterol (Calc): 83 mg/dL (calc)
Non-HDL Cholesterol (Calc): 110 mg/dL (calc) (ref <130)
Triglycerides: 172 mg/dL — ABNORMAL HIGH (ref <150)

## 2017-07-15 LAB — HEMOGLOBIN A1C
EAG (MMOL/L): 7.6 (calc)
Hgb A1c MFr Bld: 6.4 % of total Hgb — ABNORMAL HIGH (ref <5.7)
Mean Plasma Glucose: 137 (calc)

## 2017-09-09 DIAGNOSIS — Z23 Encounter for immunization: Secondary | ICD-10-CM | POA: Diagnosis not present

## 2017-10-14 ENCOUNTER — Other Ambulatory Visit: Payer: Self-pay | Admitting: Family Medicine

## 2017-10-14 DIAGNOSIS — Z79899 Other long term (current) drug therapy: Secondary | ICD-10-CM

## 2017-10-14 DIAGNOSIS — E7800 Pure hypercholesterolemia, unspecified: Secondary | ICD-10-CM

## 2017-10-14 DIAGNOSIS — E119 Type 2 diabetes mellitus without complications: Secondary | ICD-10-CM

## 2017-10-14 DIAGNOSIS — I10 Essential (primary) hypertension: Secondary | ICD-10-CM

## 2017-10-15 ENCOUNTER — Other Ambulatory Visit: Payer: 59

## 2017-10-15 DIAGNOSIS — E7800 Pure hypercholesterolemia, unspecified: Secondary | ICD-10-CM | POA: Diagnosis not present

## 2017-10-15 DIAGNOSIS — E119 Type 2 diabetes mellitus without complications: Secondary | ICD-10-CM | POA: Diagnosis not present

## 2017-10-15 DIAGNOSIS — I10 Essential (primary) hypertension: Secondary | ICD-10-CM

## 2017-10-15 DIAGNOSIS — Z79899 Other long term (current) drug therapy: Secondary | ICD-10-CM

## 2017-10-16 LAB — LIPID PANEL
CHOL/HDL RATIO: 5.5 (calc) — AB (ref <5.0)
CHOLESTEROL: 143 mg/dL (ref <200)
HDL: 26 mg/dL — ABNORMAL LOW (ref >40)
LDL CHOLESTEROL (CALC): 90 mg/dL
Non-HDL Cholesterol (Calc): 117 mg/dL (calc) (ref <130)
TRIGLYCERIDES: 170 mg/dL — AB (ref <150)

## 2017-10-16 LAB — CBC WITH DIFFERENTIAL/PLATELET
Basophils Absolute: 40 cells/uL (ref 0–200)
Basophils Relative: 0.6 %
Eosinophils Absolute: 238 cells/uL (ref 15–500)
Eosinophils Relative: 3.6 %
HEMATOCRIT: 43.8 % (ref 38.5–50.0)
HEMOGLOBIN: 14.4 g/dL (ref 13.2–17.1)
LYMPHS ABS: 2508 {cells}/uL (ref 850–3900)
MCH: 27.6 pg (ref 27.0–33.0)
MCHC: 32.9 g/dL (ref 32.0–36.0)
MCV: 83.9 fL (ref 80.0–100.0)
MONOS PCT: 7.4 %
MPV: 10.3 fL (ref 7.5–12.5)
NEUTROS ABS: 3326 {cells}/uL (ref 1500–7800)
Neutrophils Relative %: 50.4 %
Platelets: 283 10*3/uL (ref 140–400)
RBC: 5.22 10*6/uL (ref 4.20–5.80)
RDW: 13.1 % (ref 11.0–15.0)
Total Lymphocyte: 38 %
WBC mixed population: 488 cells/uL (ref 200–950)
WBC: 6.6 10*3/uL (ref 3.8–10.8)

## 2017-10-16 LAB — COMPREHENSIVE METABOLIC PANEL
AG RATIO: 1.8 (calc) (ref 1.0–2.5)
ALKALINE PHOSPHATASE (APISO): 52 U/L (ref 40–115)
ALT: 23 U/L (ref 9–46)
AST: 19 U/L (ref 10–35)
Albumin: 4.3 g/dL (ref 3.6–5.1)
BILIRUBIN TOTAL: 0.4 mg/dL (ref 0.2–1.2)
BUN/Creatinine Ratio: 14 (calc) (ref 6–22)
BUN: 23 mg/dL (ref 7–25)
CO2: 26 mmol/L (ref 20–32)
Calcium: 9.4 mg/dL (ref 8.6–10.3)
Chloride: 104 mmol/L (ref 98–110)
Creat: 1.62 mg/dL — ABNORMAL HIGH (ref 0.70–1.33)
Globulin: 2.4 g/dL (calc) (ref 1.9–3.7)
Glucose, Bld: 127 mg/dL — ABNORMAL HIGH (ref 65–99)
Potassium: 4.5 mmol/L (ref 3.5–5.3)
Sodium: 140 mmol/L (ref 135–146)
Total Protein: 6.7 g/dL (ref 6.1–8.1)

## 2017-10-16 LAB — HEMOGLOBIN A1C
Hgb A1c MFr Bld: 6.4 % of total Hgb — ABNORMAL HIGH (ref <5.7)
Mean Plasma Glucose: 137 (calc)
eAG (mmol/L): 7.6 (calc)

## 2017-10-17 ENCOUNTER — Encounter: Payer: Self-pay | Admitting: Family Medicine

## 2017-10-17 ENCOUNTER — Ambulatory Visit: Payer: 59 | Admitting: Family Medicine

## 2017-10-17 VITALS — BP 142/90 | HR 74 | Temp 97.8°F | Resp 18 | Ht 71.0 in | Wt 232.0 lb

## 2017-10-17 DIAGNOSIS — I10 Essential (primary) hypertension: Secondary | ICD-10-CM

## 2017-10-17 DIAGNOSIS — E782 Mixed hyperlipidemia: Secondary | ICD-10-CM

## 2017-10-17 DIAGNOSIS — N183 Chronic kidney disease, stage 3 unspecified: Secondary | ICD-10-CM

## 2017-10-17 DIAGNOSIS — E119 Type 2 diabetes mellitus without complications: Secondary | ICD-10-CM | POA: Diagnosis not present

## 2017-10-17 NOTE — Progress Notes (Signed)
Subjective:    Patient ID: Shawn BanJoseph R Totino Jr., male    DOB: 1960/07/16, 57 y.o.   MRN: [id number]  HPI  04/14/17+ Patient is here today for CPE.  He recently was let go by the city of [place] due to chronic orthopedic issues. Apparently the patient has "25% disability" due to limitations stemming from her right shoulder injury. He reports that he's had rotator cuff repair 2. He also has 25% disability due to chronic pain in his right ankle. Apparently he fractured his ankle in the past requiring surgery. 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 This causes restrictions on what work he can perform prompting him to seek disability. According to the patient this causes the city to let him go. He also has a history of prediabetes, hyperlipidemia, and hypertension.  Since I last saw the patient, he is experienced one episode of bright red blood per rectum.  This was associated with cramping lower abdominal pain located in the suprapubic area.  Symptoms resolve gradually over 2 days and have not occurred since.  However he is now willing to proceed with a colonoscopy to be screen for colon cancer.   At that time, mhy plan was: Given his age and the blood in his stool, I will consult GI for a colonoscopy.  I will obtain a PSA today to screen for prostate cancer.  He refuses a flu shot and the shingles shot but he will consent to Pneumovax 23.  His blood pressure today is acceptable at 130/86.  His hemoglobin A1c has risen to 6.7 and is no longer well controlled.  I have encouraged increasing aerobic exercise to address both his elevated hemoglobin A1c and also his significant dyslipidemia with his HDL cholesterol at 24-25.  The remainder of his lab work is normal.  Recheck lab work in 3-6 months with aggressive lifestyle changes  10/17/17 Patient is here today for follow-up.  He denies any polyuria, polydipsia, or blurry vision.  He denies any chest pain  shortness of breath or dyspnea on exertion.  He denies any myalgias or right upper quadrant pain.  He continues to complain of pain in his shoulder, carpal tunnel in his right wrist, left ankle pain.  These are chronic orthopedic issues the patient has had for quite some time.  Otherwise he is doing well.  He continues to be extremely sedentary due to his orthopedic issues Most recent labs are below: Appointment on 10/15/2017  Component Date Value Ref Range Status  . Hgb A1c MFr Bld 10/15/2017 6.4* <5.7 % of total Hgb Final   Comment: For someone without known diabetes, a hemoglobin  A1c value between 5.7% and 6.4% is consistent with prediabetes and should be confirmed with a  follow-up test. . For someone with known diabetes, a value <7% indicates that their diabetes is well controlled. A1c targets should be individualized based on duration of diabetes, age, comorbid conditions, and other considerations. . This assay result is consistent with an increased risk of diabetes. . Currently, no consensus exists regarding use of hemoglobin A1c for diagnosis of diabetes for children. .   . Mean Plasma Glucose 10/15/2017 137  (calc) Final  . eAG (mmol/L) 10/15/2017 7.6  (calc) Final  . Cholesterol 10/15/2017 143  <200 mg/dL Final  . HDL 40/98/119108/14/2019 26* >40 mg/dL Final  . Triglycerides 10/15/2017 170* <150 mg/dL Final  . LDL Cholesterol (Calc) 10/15/2017 90  mg/dL (calc) Final  Comment: Reference range: <100 . Desirable range <100 mg/dL for primary prevention;   <70 mg/dL for patients with CHD or diabetic patients  with > or = 2 CHD risk factors. Marland Kitchen. LDL-C is now calculated using the Martin-Hopkins  calculation, which is a validated novel method providing  better accuracy than the Friedewald equation in the  estimation of LDL-C.  Horald PollenMartin SS et al. Lenox AhrJAMA. 1610;960(452013;310(19): 2061-2068  (http://education.[facility].com/faq/FAQ164)   . Total CHOL/HDL Ratio 10/15/2017 5.5* <4.0<5.0 (calc) Final    . Non-HDL Cholesterol (Calc) 10/15/2017 117  <130 mg/dL (calc) Final   Comment: For patients with diabetes plus 1 major ASCVD risk  factor, treating to a non-HDL-C goal of <100 mg/dL  (LDL-C of <98<70 mg/dL) is considered a therapeutic  option.   . Glucose, Bld 10/15/2017 127* 65 - 99 mg/dL Final   Comment: .            Fasting reference interval . For someone without known diabetes, a glucose value >125 mg/dL indicates that they may have diabetes and this should be confirmed with a follow-up test. .   . BUN 10/15/2017 23  7 - 25 mg/dL Final  . Creat 11/91/478208/14/2019 1.62* 0.70 - 1.33 mg/dL Final   Comment: For patients >56 years of age, the reference limit for Creatinine is approximately 13% higher for people identified as African-American. .   [name]. BUN/Creatinine Ratio 10/15/2017 14  6 - 22 (calc) Final  . Sodium 10/15/2017 140  135 - 146 mmol/L Final  . Potassium 10/15/2017 4.5  3.5 - 5.3 mmol/L Final  . Chloride 10/15/2017 104  98 - 110 mmol/L Final  . CO2 10/15/2017 26  20 - 32 mmol/L Final  . Calcium 10/15/2017 9.4  8.6 - 10.3 mg/dL Final  . Total Protein 10/15/2017 6.7  6.1 - 8.1 g/dL Final  . Albumin 95/62/130808/14/2019 4.3  3.6 - 5.1 g/dL Final  . Globulin 65/78/469608/14/2019 2.4  1.9 - 3.7 g/dL (calc) Final  . AG Ratio 10/15/2017 1.8  1.0 - 2.5 (calc) Final  . Total Bilirubin 10/15/2017 0.4  0.2 - 1.2 mg/dL Final  . Alkaline phosphatase (APISO) 10/15/2017 52  40 - 115 U/L Final  . AST 10/15/2017 19  10 - 35 U/L Final  . ALT 10/15/2017 23  9 - 46 U/L Final  . WBC 10/15/2017 6.6  3.8 - 10.8 Thousand/uL Final  . RBC 10/15/2017 5.22  4.20 - 5.80 Million/uL Final  . Hemoglobin 10/15/2017 14.4  13.2 - 17.1 g/dL Final  . HCT 29/52/841308/14/2019 43.8  38.5 - 50.0 % Final  . MCV 10/15/2017 83.9  80.0 - 100.0 fL Final  . MCH 10/15/2017 27.6  27.0 - 33.0 pg Final  . MCHC 10/15/2017 32.9  32.0 - 36.0 g/dL Final  . RDW 24/40/102708/14/2019 13.1  11.0 - 15.0 % Final  . Platelets 10/15/2017 283  140 - 400 Thousand/uL Final   . MPV 10/15/2017 10.3  7.5 - 12.5 fL Final  . Neutro Abs 10/15/2017 3,326  1,500 - 7,800 cells/uL Final  . Lymphs Abs 10/15/2017 2,508  850 - 3,900 cells/uL Final  . WBC mixed population 10/15/2017 488  200 - 950 cells/uL Final  . Eosinophils Absolute 10/15/2017 238  15 - 500 cells/uL Final  . Basophils Absolute 10/15/2017 40  0 - 200 cells/uL Final  . Neutrophils Relative % 10/15/2017 50.4  % Final  . Total Lymphocyte 10/15/2017 38.0  % Final  . Monocytes Relative 10/15/2017 7.4  % Final  . Eosinophils Relative 10/15/2017  3.6  % Final  . Basophils Relative 10/15/2017 0.6  % Final    Past Medical History:  Diagnosis Date  . Allergy   . Arthritis   . Chronic bronchitis   . Chronic pain    per pt/ right shoulder pain, left leg and ankle  . CKD (chronic kidney disease) stage 3, GFR 30-59 ml/min (HCC)   . Costochondral chest pain    due to torn tendons.  . CTS (carpal tunnel syndrome)    right hand  . Diabetes mellitus without complication (HCC)    prediabetes/ no meds  . Diverticulitis   . GERD (gastroesophageal reflux disease)   . History of anal fissures   . Hyperlipemia   . Hypertension   . Rectal bleeding    occasional  . Seasonal allergies    Past Surgical History:  Procedure Laterality Date  . ANKLE FRACTURE SURGERY  2005 / 2007   left ankle and  left leg/ have screws in leg and ankle  . EXAMINATION UNDER ANESTHESIA N/A 12/15/2012   Procedure: EXAM UNDER ANESTHESIA;  Surgeon: Atilano Ina, MD;  Location: [facility];  Service: General;  Laterality: N/A;  . FRACTURE SURGERY  2005 and 2007   lt ankle x2  . HEMORRHOIDECTOMY WITH HEMORRHOID BANDING N/A 12/15/2012   Procedure: EXCISIONAL HEMORRHOIDECTOMY WITH HEMORRHOID BANDING;  Surgeon: Atilano Ina, MD;  Location: [facility];  Service: General;  Laterality: N/A;  . rotator cuff surgery      x 2/ right shoulder  . TRIGGER FINGER RELEASE  07/23/2011   Procedure: Right hand- RELEASE  TRIGGER FINGER/A-1 PULLEY;  Surgeon: Wyn Forster., MD;  Location: [facility];  Service: Orthopedics;  Laterality: Right;  . WRIST FRACTURE SURGERY  1984   lt with bone graft  . WRIST SURGERY     left wrist   Current Outpatient Medications on File Prior to Visit  Medication Sig Dispense Refill  . aspirin EC 81 MG tablet Take 81 mg by mouth daily.    . Biotin 10 MG TABS Take by mouth. Biotin 10 mg every other day    . calcium carbonate (OS-CAL) 600 MG TABS Take 600 mg by mouth. Every other day    . carvedilol (COREG) 12.5 MG tablet Take 1 tablet (12.5 mg total) by mouth 2 (two) times daily with a meal. 180 tablet 3  . cetirizine (ZYRTEC) 10 MG tablet Take 1 tablet (10 mg total) by mouth daily. 90 tablet 3  . Cinnamon 500 MG capsule Take 500 mg by mouth daily.    . fenofibrate 160 MG tablet TAKE 1 TABLET BY MOUTH  DAILY 90 tablet 3  . fish oil-omega-3 fatty acids 1000 MG capsule Take by mouth daily. Take 2 pills in am and 1 pill at night.    . fluticasone (FLONASE) 50 MCG/ACT nasal spray Place 2 sprays into both nostrils daily. 16 g 5  . Glucosamine 500 MG CAPS Take by mouth. Glucosamine 1000 mg bid    . glucosamine-chondroitin 500-400 MG tablet Take 1 tablet by mouth 3 (three) times daily.    Marland Kitchen L-LYSINE PO Take by mouth daily at 6 (six) AM.    . losartan (COZAAR) 50 MG tablet Take 1 tablet (50 mg total) by mouth daily. 90 tablet 3  . LUTEIN PO Take 1 tablet by mouth daily.    . mometasone (ELOCON) 0.1 % cream Apply 1 application topically daily. (Patient not taking: Reported on 05/28/2017) 45  g 0  . Multiple Vitamin (MULITIVITAMIN WITH MINERALS) TABS Take 1 tablet by mouth daily.    . NON FORMULARY Vita fusion gummie-Take 5 in am and 5 in evening    . NON FORMULARY Mangostein capsule-Take 3 pills in am and 2 pills in pm.    . omeprazole (PRILOSEC) 20 MG capsule Take 1 capsule (20 mg total) by mouth 2 (two) times daily before a meal. 180 capsule 3  . simvastatin (ZOCOR)  20 MG tablet TAKE 1 TABLET BY MOUTH IN THE EVENING 90 tablet 1   Current Facility-Administered Medications on File Prior to Visit  Medication Dose Route Frequency Provider Last Rate Last Dose  . 0.9 %  sodium chloride infusion  500 mL Intravenous Once Sherrilyn Rist, MD       Allergies  Allergen Reactions  . Amlodipine Other (See Comments)    Per pt - damage to kidney's  . Codeine Nausea And Vomiting  . Ivp Dye [Iodinated Diagnostic Agents]     Nauseated  . Penicillins Nausea And Vomiting    As a child  . Sulfa Antibiotics     nauseated   Social History   Socioeconomic History  . Marital status: Single    Spouse name: Not on file  . Number of children: Not on file  . Years of education: Not on file  . Highest education level: Not on file  Occupational History  . Not on file  Social Needs  . Financial resource strain: Not on file  . Food insecurity:    Worry: Not on file    Inability: Not on file  . Transportation needs:    Medical: Not on file    Non-medical: Not on file  Tobacco Use  . Smoking status: Never Smoker  . Smokeless tobacco: Never Used  Substance and Sexual Activity  . Alcohol use: No  . Drug use: No  . Sexual activity: Not on file  Lifestyle  . Physical activity:    Days per week: Not on file    Minutes per session: Not on file  . Stress: Not on file  Relationships  . Social connections:    Talks on phone: Not on file    Gets together: Not on file    Attends religious service: Not on file    Active member of club or organization: Not on file    Attends meetings of clubs or organizations: Not on file    Relationship status: Not on file  . Intimate partner violence:    Fear of current or ex partner: Not on file    Emotionally abused: Not on file    Physically abused: Not on file    Forced sexual activity: Not on file  Other Topics Concern  . Not on file  Social History Narrative  . Not on file   Family History  Problem Relation Age of  Onset  . Heart disease Mother   . Hyperlipidemia Mother   . Diabetes Mother   . Macular degeneration Mother   . Cancer Father        skin  . Macular degeneration Sister   . Diabetes Brother   . Colon cancer Neg Hx   . Stomach cancer Neg Hx   . Esophageal cancer Neg Hx   . Rectal cancer Neg Hx       Review of Systems  All other systems reviewed and are negative.      Objective:   Physical Exam  Constitutional:  He is oriented to person, place, and time. He appears well-developed and well-nourished. No distress.  HENT:  Right Ear: External ear normal.  Left Ear: External ear normal.  Nose: Nose normal.  Mouth/Throat: Oropharynx is clear and moist. No oropharyngeal exudate.  Eyes: Pupils are equal, round, and reactive to light. Conjunctivae are normal.  Neck: Neck supple. No JVD present. No thyromegaly present.  Cardiovascular: Normal rate, regular rhythm, normal heart sounds and intact distal pulses. Exam reveals no gallop and no friction rub.  No murmur heard. Pulmonary/Chest: Effort normal and breath sounds normal. No respiratory distress. He has no wheezes. He has no rales. He exhibits no tenderness.  Abdominal: Soft. Bowel sounds are normal. He exhibits no distension and no mass. There is no tenderness. There is no rebound and no guarding.  Musculoskeletal: Normal range of motion. He exhibits tenderness. He exhibits no edema or deformity.  Lymphadenopathy:    He has no cervical adenopathy.  Neurological: He is alert and oriented to person, place, and time. He has normal reflexes. No cranial nerve deficit. Coordination normal.  Skin: He is not diaphoretic.  Psychiatric: He has a normal mood and affect. His behavior is normal. Judgment and thought content normal.  Vitals reviewed.        Benign essential HTN  Controlled type 2 diabetes mellitus without complication, without long-term current use of insulin (HCC)  Mixed hyperlipidemia  CKD (chronic kidney disease)  stage 3, GFR 30-59 ml/min (HCC)  I spent 20 minutes with the patient today reviewing his labs.  I recommended that he check his blood pressure multiple times a day over the next week and keep a record that he can provide to me.  With his chronic kidney disease, I would like to see his blood pressures in the 130s over 80s.  Is a little high today and I explained that to the patient.  I also recommended that he avoid any arthritis medication other than Tylenol.  I would recheck his kidney function in 6 months.  He calls in a few weeks and his blood pressure is higher than 140/90, I would add amlodipine.  Patient's triglycerides are elevated and his HDL is low.  He is already on a statin, fenofibrate, and [place].  I recommended 30 minutes a day 5 days a week of aerobic exercise to help address this.  His hemoglobin A1c is excellent at 6.4 I would make no changes.  I recommended a flu shot but the patient declined

## 2017-10-19 ENCOUNTER — Other Ambulatory Visit: Payer: Self-pay | Admitting: Family Medicine

## 2018-04-07 ENCOUNTER — Other Ambulatory Visit: Payer: Self-pay | Admitting: Family Medicine

## 2018-04-14 ENCOUNTER — Other Ambulatory Visit: Payer: Self-pay | Admitting: Family Medicine

## 2018-04-15 ENCOUNTER — Other Ambulatory Visit: Payer: 59

## 2018-04-15 DIAGNOSIS — E119 Type 2 diabetes mellitus without complications: Secondary | ICD-10-CM | POA: Diagnosis not present

## 2018-04-15 DIAGNOSIS — N183 Chronic kidney disease, stage 3 unspecified: Secondary | ICD-10-CM

## 2018-04-15 DIAGNOSIS — Z125 Encounter for screening for malignant neoplasm of prostate: Secondary | ICD-10-CM | POA: Diagnosis not present

## 2018-04-15 DIAGNOSIS — I10 Essential (primary) hypertension: Secondary | ICD-10-CM

## 2018-04-15 DIAGNOSIS — Z0000 Encounter for general adult medical examination without abnormal findings: Secondary | ICD-10-CM

## 2018-04-15 DIAGNOSIS — Z79899 Other long term (current) drug therapy: Secondary | ICD-10-CM

## 2018-04-16 LAB — CBC WITH DIFFERENTIAL/PLATELET
Absolute Monocytes: 373 cells/uL (ref 200–950)
BASOS PCT: 1.1 %
Basophils Absolute: 51 cells/uL (ref 0–200)
EOS PCT: 3.9 %
Eosinophils Absolute: 179 cells/uL (ref 15–500)
HEMATOCRIT: 43.7 % (ref 38.5–50.0)
Hemoglobin: 14.7 g/dL (ref 13.2–17.1)
LYMPHS ABS: 1734 {cells}/uL (ref 850–3900)
MCH: 28.3 pg (ref 27.0–33.0)
MCHC: 33.6 g/dL (ref 32.0–36.0)
MCV: 84 fL (ref 80.0–100.0)
MPV: 9.7 fL (ref 7.5–12.5)
Monocytes Relative: 8.1 %
NEUTROS PCT: 49.2 %
Neutro Abs: 2263 cells/uL (ref 1500–7800)
PLATELETS: 268 10*3/uL (ref 140–400)
RBC: 5.2 10*6/uL (ref 4.20–5.80)
RDW: 13.3 % (ref 11.0–15.0)
TOTAL LYMPHOCYTE: 37.7 %
WBC: 4.6 10*3/uL (ref 3.8–10.8)

## 2018-04-16 LAB — COMPREHENSIVE METABOLIC PANEL
AG Ratio: 1.7 (calc) (ref 1.0–2.5)
ALKALINE PHOSPHATASE (APISO): 45 U/L (ref 35–144)
ALT: 22 U/L (ref 9–46)
AST: 18 U/L (ref 10–35)
Albumin: 4 g/dL (ref 3.6–5.1)
BUN: 20 mg/dL (ref 7–25)
CHLORIDE: 106 mmol/L (ref 98–110)
CO2: 27 mmol/L (ref 20–32)
CREATININE: 1.33 mg/dL (ref 0.70–1.33)
Calcium: 9.6 mg/dL (ref 8.6–10.3)
GLUCOSE: 120 mg/dL — AB (ref 65–99)
Globulin: 2.4 g/dL (calc) (ref 1.9–3.7)
Potassium: 4.4 mmol/L (ref 3.5–5.3)
Sodium: 141 mmol/L (ref 135–146)
Total Bilirubin: 0.5 mg/dL (ref 0.2–1.2)
Total Protein: 6.4 g/dL (ref 6.1–8.1)

## 2018-04-16 LAB — HEMOGLOBIN A1C
HEMOGLOBIN A1C: 6.7 %{Hb} — AB (ref <5.7)
MEAN PLASMA GLUCOSE: 146 (calc)
eAG (mmol/L): 8.1 (calc)

## 2018-04-16 LAB — LIPID PANEL
CHOL/HDL RATIO: 5.8 (calc) — AB (ref <5.0)
CHOLESTEROL: 138 mg/dL (ref <200)
HDL: 24 mg/dL — AB (ref >=40)
LDL Cholesterol (Calc): 89 mg/dL (calc)
NON-HDL CHOLESTEROL (CALC): 114 mg/dL (ref <130)
TRIGLYCERIDES: 149 mg/dL (ref <150)

## 2018-04-16 LAB — PSA: PSA: 0.3 ng/mL (ref <=4.0)

## 2018-04-20 ENCOUNTER — Encounter: Payer: Self-pay | Admitting: Family Medicine

## 2018-04-20 ENCOUNTER — Ambulatory Visit (INDEPENDENT_AMBULATORY_CARE_PROVIDER_SITE_OTHER): Payer: 59 | Admitting: Family Medicine

## 2018-04-20 VITALS — BP 144/88 | HR 80 | Temp 97.8°F | Resp 14 | Ht 71.0 in | Wt 234.0 lb

## 2018-04-20 DIAGNOSIS — E119 Type 2 diabetes mellitus without complications: Secondary | ICD-10-CM | POA: Diagnosis not present

## 2018-04-20 DIAGNOSIS — Z23 Encounter for immunization: Secondary | ICD-10-CM

## 2018-04-20 DIAGNOSIS — Z125 Encounter for screening for malignant neoplasm of prostate: Secondary | ICD-10-CM

## 2018-04-20 DIAGNOSIS — I10 Essential (primary) hypertension: Secondary | ICD-10-CM | POA: Diagnosis not present

## 2018-04-20 DIAGNOSIS — E782 Mixed hyperlipidemia: Secondary | ICD-10-CM

## 2018-04-20 DIAGNOSIS — N183 Chronic kidney disease, stage 3 unspecified: Secondary | ICD-10-CM

## 2018-04-20 DIAGNOSIS — Z0000 Encounter for general adult medical examination without abnormal findings: Secondary | ICD-10-CM | POA: Diagnosis not present

## 2018-04-20 NOTE — Addendum Note (Signed)
Addended by: Legrand Rams B on: 04/20/2018 12:29 PM   Modules accepted: Orders

## 2018-04-20 NOTE — Progress Notes (Signed)
Subjective:    Patient ID: Shawn Ban., male    DOB: 1960/11/17, 58 y.o.   MRN: [id number]  HPI Patient is here today for CPE.  Patient had a colonoscopy in April 2019 that was completely normal aside from diverticulosis.  Next colonoscopy was recommended in 10 years. Immunization records: Immunization History  Administered Date(s) Administered  . Pneumococcal Polysaccharide-23 04/14/2017  . Zoster Recombinat (Shingrix) 05/22/2017, 09/09/2017  Patient is due for a flu shot as well as a tetanus shot.  He declines a tetanus shot but he does consent to a flu shot.  State cancer screening was performed with a PSA which was normal at 0.3.  He is overdue for diabetic eye exam.  He is also due for diabetic foot exam.  He agrees to allow me to schedule him to see an ophthalmologist.  His most recent lab work as listed below.:  Lab on 04/15/2018  Component Date Value Ref Range Status  . WBC 04/15/2018 4.6  3.8 - 10.8 Thousand/uL Final  . RBC 04/15/2018 5.20  4.20 - 5.80 Million/uL Final  . Hemoglobin 04/15/2018 14.7  13.2 - 17.1 g/dL Final  . HCT 44/97/5300 43.7  38.5 - 50.0 % Final  . MCV 04/15/2018 84.0  80.0 - 100.0 fL Final  . MCH 04/15/2018 28.3  27.0 - 33.0 pg Final  . MCHC 04/15/2018 33.6  32.0 - 36.0 g/dL Final  . RDW 51/12/2109 13.3  11.0 - 15.0 % Final  . Platelets 04/15/2018 268  140 - 400 Thousand/uL Final  . MPV 04/15/2018 9.7  7.5 - 12.5 fL Final  . Neutro Abs 04/15/2018 2,263  1,500 - 7,800 cells/uL Final  . Lymphs Abs 04/15/2018 1,734  850 - 3,900 cells/uL Final  . Absolute Monocytes 04/15/2018 373  200 - 950 cells/uL Final  . Eosinophils Absolute 04/15/2018 179  15 - 500 cells/uL Final  . Basophils Absolute 04/15/2018 51  0 - 200 cells/uL Final  . Neutrophils Relative % 04/15/2018 49.2  % Final  . Total Lymphocyte 04/15/2018 37.7  % Final  . Monocytes Relative 04/15/2018 8.1  % Final  . Eosinophils Relative 04/15/2018 3.9  % Final  . Basophils Relative  04/15/2018 1.1  % Final  . Glucose, Bld 04/15/2018 120* 65 - 99 mg/dL Final   Comment: .            Fasting reference interval . For someone without known diabetes, a glucose value between 100 and 125 mg/dL is consistent with prediabetes and should be confirmed with a follow-up test. .   . BUN 04/15/2018 20  7 - 25 mg/dL Final  . Creat 73/56/7014 1.33  0.70 - 1.33 mg/dL Final   Comment: For patients >75 years of age, the reference limit for Creatinine is approximately 13% higher for people identified as African-American. .   [name] Ratio 04/15/2018 NOT APPLICABLE  6 - 22 (calc) Final  . Sodium 04/15/2018 141  135 - 146 mmol/L Final  . Potassium 04/15/2018 4.4  3.5 - 5.3 mmol/L Final  . Chloride 04/15/2018 106  98 - 110 mmol/L Final  . CO2 04/15/2018 27  20 - 32 mmol/L Final  . Calcium 04/15/2018 9.6  8.6 - 10.3 mg/dL Final  . Total Protein 04/15/2018 6.4  6.1 - 8.1 g/dL Final  . Albumin 12/31/1312 4.0  3.6 - 5.1 g/dL Final  . Globulin 38/88/7579 2.4  1.9 - 3.7 g/dL (calc) Final  . AG Ratio 04/15/2018 1.7  1.0 - 2.5 (  calc) Final  . Total Bilirubin 04/15/2018 0.5  0.2 - 1.2 mg/dL Final  . Alkaline phosphatase (APISO) 04/15/2018 45  35 - 144 U/L Final  . AST 04/15/2018 18  10 - 35 U/L Final  . ALT 04/15/2018 22  9 - 46 U/L Final  . Cholesterol 04/15/2018 138  <200 mg/dL Final  . HDL 16/12/9602 24* > OR = 40 mg/dL Final  . Triglycerides 04/15/2018 149  <150 mg/dL Final  . LDL Cholesterol (Calc) 04/15/2018 89  mg/dL (calc) Final   Comment: Reference range: <100 . Desirable range <100 mg/dL for primary prevention;   <70 mg/dL for patients with CHD or diabetic patients  with > or = 2 CHD risk factors. Marland Kitchen LDL-C is now calculated using the Martin-Hopkins  calculation, which is a validated novel method providing  better accuracy than the Friedewald equation in the  estimation of LDL-C.  Horald Pollen et al. Lenox Ahr. 5409;811(91): 2061-2068    (http://education.[facility].com/faq/FAQ164)   . Total CHOL/HDL Ratio 04/15/2018 5.8* <4.7 (calc) Final  . Non-HDL Cholesterol (Calc) 04/15/2018 114  <130 mg/dL (calc) Final   Comment: For patients with diabetes plus 1 major ASCVD risk  factor, treating to a non-HDL-C goal of <100 mg/dL  (LDL-C of <82 mg/dL) is considered a therapeutic  option.   Marland Kitchen PSA 04/15/2018 0.3  < OR = 4.0 ng/mL Final   Comment: The total PSA value from this assay system is  standardized against the WHO standard. The test  result will be approximately 20% lower when compared  to the equimolar-standardized total PSA (Beckman  Coulter). Comparison of serial PSA results should be  interpreted with this fact in mind. . This test was performed using the Siemens  chemiluminescent method. Values obtained from  different assay methods cannot be used interchangeably. PSA levels, regardless of value, should not be interpreted as absolute evidence of the presence or absence of disease.   . Hgb A1c MFr Bld 04/15/2018 6.7* <5.7 % of total Hgb Final   Comment: For someone without known diabetes, a hemoglobin A1c value of 6.5% or greater indicates that they may have  diabetes and this should be confirmed with a follow-up  test. . For someone with known diabetes, a value <7% indicates  that their diabetes is well controlled and a value  greater than or equal to 7% indicates suboptimal  control. A1c targets should be individualized based on  duration of diabetes, age, comorbid conditions, and  other considerations. . Currently, no consensus exists regarding use of hemoglobin A1c for diagnosis of diabetes for children. .   . Mean Plasma Glucose 04/15/2018 146  (calc) Final  . eAG (mmol/L) 04/15/2018 8.1  (calc) Final    Past Medical History:  Diagnosis Date  . Allergy   . Arthritis   . Chronic bronchitis   . Chronic pain    per pt/ right shoulder pain, left leg and ankle  . CKD (chronic kidney disease)  stage 3, GFR 30-59 ml/min (HCC)   . Costochondral chest pain    due to torn tendons.  . CTS (carpal tunnel syndrome)    right hand  . Diabetes mellitus without complication (HCC)    prediabetes/ no meds  . Diverticulitis   . GERD (gastroesophageal reflux disease)   . History of anal fissures   . Hyperlipemia   . Hypertension   . Rectal bleeding    occasional  . Seasonal allergies    Past Surgical History:  Procedure Laterality Date  . ANKLE  FRACTURE SURGERY  2005 / 2007   left ankle and  left leg/ have screws in leg and ankle  . EXAMINATION UNDER ANESTHESIA N/A 12/15/2012   Procedure: EXAM UNDER ANESTHESIA;  Surgeon: Atilano Ina, MD;  Location: [facility];  Service: General;  Laterality: N/A;  . FRACTURE SURGERY  2005 and 2007   lt ankle x2  . HEMORRHOIDECTOMY WITH HEMORRHOID BANDING N/A 12/15/2012   Procedure: EXCISIONAL HEMORRHOIDECTOMY WITH HEMORRHOID BANDING;  Surgeon: Atilano Ina, MD;  Location: [facility];  Service: General;  Laterality: N/A;  . rotator cuff surgery      x 2/ right shoulder  . TRIGGER FINGER RELEASE  07/23/2011   Procedure: Right hand- RELEASE TRIGGER FINGER/A-1 PULLEY;  Surgeon: Wyn Forster., MD;  Location: [facility];  Service: Orthopedics;  Laterality: Right;  . WRIST FRACTURE SURGERY  1984   lt with bone graft  . WRIST SURGERY     left wrist   Current Outpatient Medications on File Prior to Visit  Medication Sig Dispense Refill  . APPLE CID VN-GRN TEA-BIT OR-CR PO     . aspirin EC 81 MG tablet Take 81 mg by mouth daily.    . Biotin 10 MG TABS Take by mouth. Biotin 10 mg every other day    . calcium carbonate (OS-CAL) 600 MG TABS Take 600 mg by mouth. Every other day    . carvedilol (COREG) 12.5 MG tablet Take 1 tablet (12.5 mg total) by mouth 2 (two) times daily with a meal. 180 tablet 3  . cetirizine (ZYRTEC) 10 MG tablet Take 1 tablet (10 mg total) by mouth daily. 90 tablet 3  . Cinnamon 500  MG capsule Take 500 mg by mouth daily.    Marland Kitchen EXTRA STRENGTH ACETAMINOPHEN PO     . fenofibrate 160 MG tablet TAKE 1 TABLET BY MOUTH ONCE DAILY 90 tablet 0  . fish oil-omega-3 fatty acids 1000 MG capsule Take by mouth daily. Take 2 pills in am and 1 pill at night.    . fluticasone (FLONASE) 50 MCG/ACT nasal spray Place 2 sprays into both nostrils daily. 16 g 5  . Glucosamine 500 MG CAPS Take by mouth. Glucosamine 1000 mg bid    . glucosamine-chondroitin 500-400 MG tablet Take 1 tablet by mouth 3 (three) times daily.    Marland Kitchen L-LYSINE PO Take by mouth daily at 6 (six) AM.    . losartan (COZAAR) 50 MG tablet Take 1 tablet (50 mg total) by mouth daily. 90 tablet 3  . LUTEIN PO Take 1 tablet by mouth daily.    . mometasone (ELOCON) 0.1 % cream Apply 1 application topically daily. 45 g 0  . Multiple Vitamin (MULITIVITAMIN WITH MINERALS) TABS Take 1 tablet by mouth daily.    . NON FORMULARY Vita fusion gummie-Take 5 in am and 5 in evening    . NON FORMULARY Mangostein capsule-Take 3 pills in am and 2 pills in pm.    . omeprazole (PRILOSEC) 20 MG capsule Take 1 capsule (20 mg total) by mouth 2 (two) times daily before a meal. 180 capsule 3  . simvastatin (ZOCOR) 20 MG tablet TAKE 1 TABLET BY MOUTH IN THE EVENING 90 tablet 0   Current Facility-Administered Medications on File Prior to Visit  Medication Dose Route Frequency Provider Last Rate Last Dose  . 0.9 %  sodium chloride infusion  500 mL Intravenous Once Sherrilyn Rist, MD  Allergies  Allergen Reactions  . Amlodipine Other (See Comments)    Per pt - damage to kidney's  . Codeine Nausea And Vomiting  . Ivp Dye [Iodinated Diagnostic Agents]     Nauseated  . Penicillins Nausea And Vomiting    As a child  . Sulfa Antibiotics     nauseated   Social History   Socioeconomic History  . Marital status: Single    Spouse name: Not on file  . Number of children: Not on file  . Years of education: Not on file  . Highest education level:  Not on file  Occupational History  . Not on file  Social Needs  . Financial resource strain: Not on file  . Food insecurity:    Worry: Not on file    Inability: Not on file  . Transportation needs:    Medical: Not on file    Non-medical: Not on file  Tobacco Use  . Smoking status: Never Smoker  . Smokeless tobacco: Never Used  Substance and Sexual Activity  . Alcohol use: No  . Drug use: No  . Sexual activity: Not on file  Lifestyle  . Physical activity:    Days per week: Not on file    Minutes per session: Not on file  . Stress: Not on file  Relationships  . Social connections:    Talks on phone: Not on file    Gets together: Not on file    Attends religious service: Not on file    Active member of club or organization: Not on file    Attends meetings of clubs or organizations: Not on file    Relationship status: Not on file  . Intimate partner violence:    Fear of current or ex partner: Not on file    Emotionally abused: Not on file    Physically abused: Not on file    Forced sexual activity: Not on file  Other Topics Concern  . Not on file  Social History Narrative  . Not on file   Family History  Problem Relation Age of Onset  . Heart disease Mother   . Hyperlipidemia Mother   . Diabetes Mother   . Macular degeneration Mother   . Cancer Father        skin  . Macular degeneration Sister   . Diabetes Brother   . Colon cancer Neg Hx   . Stomach cancer Neg Hx   . Esophageal cancer Neg Hx   . Rectal cancer Neg Hx       Review of Systems  All other systems reviewed and are negative.      Objective:   Physical Exam  Constitutional: He is oriented to person, place, and time. He appears well-developed and well-nourished. No distress.  HENT:  Right Ear: External ear normal.  Left Ear: External ear normal.  Nose: Nose normal.  Mouth/Throat: Oropharynx is clear and moist. No oropharyngeal exudate.  Eyes: Pupils are equal, round, and reactive to light.  Conjunctivae are normal.  Neck: Neck supple. No JVD present. No thyromegaly present.  Cardiovascular: Normal rate, regular rhythm, normal heart sounds and intact distal pulses. Exam reveals no gallop and no friction rub.  No murmur heard. Pulmonary/Chest: Effort normal and breath sounds normal. No respiratory distress. He has no wheezes. He has no rales. He exhibits no tenderness.  Abdominal: Soft. Bowel sounds are normal. He exhibits no distension and no mass. There is no abdominal tenderness. There is no rebound and no  guarding.  Musculoskeletal: Normal range of motion.        General: Tenderness present. No deformity or edema.  Lymphadenopathy:    He has no cervical adenopathy.  Neurological: He is alert and oriented to person, place, and time. He has normal reflexes. No cranial nerve deficit. Coordination normal.  Skin: He is not diaphoretic.  Psychiatric: He has a normal mood and affect. His behavior is normal. Judgment and thought content normal.  Vitals reviewed.         A/P General medical exam  Controlled type 2 diabetes mellitus without complication, without long-term current use of insulin (HCC) - Plan: Ambulatory referral to Ophthalmology  Mixed hyperlipidemia  Benign essential HTN  Prostate cancer screening  CKD (chronic kidney disease) stage 3, GFR 30-59 ml/min (HCC)  Patient's renal function has improved.  Continue to encourage him to avoid NSAIDs.  I am okay with him taking Tylenol twice a day for the pain in his shoulders and legs.  Patient is diet-controlled diabetic.  A1c is acceptable at 6.7.  However his HDL cholesterol is very low at 24.  His blood pressure today is also slightly elevated at 144.  I believe that hemoglobin A1c, the HDL cholesterol, and the blood pressure could all improve if the patient got 30 minutes a day 5 days a week of aerobic exercise.  I encouraged the patient to try to do these things.  I will consult an ophthalmologist for an annual  diabetic eye exam.  Diabetic foot exam shows no evidence of neuropathy or peripheral vascular disease.  LDL cholesterol is acceptable.  Prostate screening/PSA is normal.  Colonoscopy is up-to-date.  Patient received his flu shot today.  The remainder of his preventative care is up-to-date.

## 2018-04-23 ENCOUNTER — Other Ambulatory Visit: Payer: Self-pay | Admitting: Family Medicine

## 2018-04-27 ENCOUNTER — Other Ambulatory Visit: Payer: Self-pay | Admitting: Family Medicine

## 2018-04-27 ENCOUNTER — Ambulatory Visit: Payer: 59 | Admitting: Family Medicine

## 2018-04-27 ENCOUNTER — Encounter: Payer: Self-pay | Admitting: Family Medicine

## 2018-04-27 VITALS — BP 120/84 | HR 74 | Temp 97.8°F | Resp 16 | Ht 71.0 in | Wt 234.0 lb

## 2018-04-27 DIAGNOSIS — R140 Abdominal distension (gaseous): Secondary | ICD-10-CM

## 2018-04-27 DIAGNOSIS — L918 Other hypertrophic disorders of the skin: Secondary | ICD-10-CM

## 2018-04-27 MED ORDER — FLUTICASONE PROPIONATE 50 MCG/ACT NA SUSP: 2.0000 | Freq: Every day | NASAL | 5 refills | Status: AC

## 2018-04-27 MED ORDER — OMEPRAZOLE 20 MG PO CPDR: 20.0000 mg | DELAYED_RELEASE_CAPSULE | Freq: Two times a day (BID) | ORAL | 3 refills | Status: DC

## 2018-04-27 MED ORDER — CETIRIZINE HCL 10 MG PO TABS: 10.0000 mg | ORAL_TABLET | Freq: Every day | ORAL | 3 refills | Status: DC

## 2018-04-27 MED ORDER — LOSARTAN POTASSIUM 50 MG PO TABS: 50.0000 mg | ORAL_TABLET | Freq: Every day | ORAL | 3 refills | Status: DC

## 2018-04-27 MED ORDER — FENOFIBRATE 160 MG PO TABS: 160.0000 mg | ORAL_TABLET | Freq: Every day | ORAL | 3 refills | Status: DC

## 2018-04-27 MED ORDER — CARVEDILOL 12.5 MG PO TABS: ORAL_TABLET | ORAL | 3 refills | Status: DC

## 2018-04-27 MED ORDER — SIMVASTATIN 20 MG PO TABS: 20.0000 mg | ORAL_TABLET | Freq: Every evening | ORAL | 3 refills | Status: DC

## 2018-04-27 NOTE — Progress Notes (Signed)
Subjective:    Patient ID: Shawn Ball., male    DOB: [birth year], 58 y.o.   MRN: [id number]  HPI Patient is here today to have a skin tag removed from his left axilla.  Skin tag is approximately 4 mm in diameter.  It is pedunculated.  It is irritated and bothers the patient frequently getting pinched by his clothing.  He also reports abdominal bloating and distention and constipation.  He has been taking elderberry syrup but is having a difficult time having a bowel movement.  Despite increasing his fiber, he has been getting crampy lower abdominal pain.  He denies any blood in his stool.  He denies any fevers or chills Past Medical History:  Diagnosis Date  . Allergy   . Arthritis   . Chronic bronchitis   . Chronic pain    per pt/ right shoulder pain, left leg and ankle  . CKD (chronic kidney disease) stage 3, GFR 30-59 ml/min (HCC)   . Costochondral chest pain    due to torn tendons.  . CTS (carpal tunnel syndrome)    right hand  . Diabetes mellitus without complication (HCC)    prediabetes/ no meds  . Diverticulitis   . GERD (gastroesophageal reflux disease)   . History of anal fissures   . Hyperlipemia   . Hypertension   . Rectal bleeding    occasional  . Seasonal allergies    Past Surgical History:  Procedure Laterality Date  . ANKLE FRACTURE SURGERY  2005 / 2007   left ankle and  left leg/ have screws in leg and ankle  . EXAMINATION UNDER ANESTHESIA N/A 12/15/2012   Procedure: EXAM UNDER ANESTHESIA;  Surgeon: Atilano Ina, MD;  Location: [facility];  Service: General;  Laterality: N/A;  . FRACTURE SURGERY  2005 and 2007   lt ankle x2  . HEMORRHOIDECTOMY WITH HEMORRHOID BANDING N/A 12/15/2012   Procedure: EXCISIONAL HEMORRHOIDECTOMY WITH HEMORRHOID BANDING;  Surgeon: Atilano Ina, MD;  Location: [facility];  Service: General;  Laterality: N/A;  . rotator cuff surgery      x 2/ right shoulder  . TRIGGER FINGER RELEASE  07/23/2011     Procedure: Right hand- RELEASE TRIGGER FINGER/A-1 PULLEY;  Surgeon: Wyn Forster., MD;  Location: [facility];  Service: Orthopedics;  Laterality: Right;  . WRIST FRACTURE SURGERY  1984   lt with bone graft  . WRIST SURGERY     left wrist   Current Outpatient Medications on File Prior to Visit  Medication Sig Dispense Refill  . APPLE CID VN-GRN TEA-BIT OR-CR PO     . aspirin EC 81 MG tablet Take 81 mg by mouth daily.    . Biotin 10 MG TABS Take by mouth. Biotin 10 mg every other day    . calcium carbonate (OS-CAL) 600 MG TABS Take 600 mg by mouth. Every other day    . Cinnamon 500 MG capsule Take 500 mg by mouth daily.    Marland Kitchen EXTRA STRENGTH ACETAMINOPHEN PO     . fish oil-omega-3 fatty acids 1000 MG capsule Take by mouth daily. Take 2 pills in am and 1 pill at night.    . Glucosamine 500 MG CAPS Take by mouth. Glucosamine 1000 mg bid    . glucosamine-chondroitin 500-400 MG tablet Take 1 tablet by mouth 3 (three) times daily.    Marland Kitchen L-LYSINE PO Take by mouth daily at 6 (six) AM.    .  LUTEIN PO Take 1 tablet by mouth daily.    . mometasone (ELOCON) 0.1 % cream Apply 1 application topically daily. 45 g 0  . Multiple Vitamin (MULITIVITAMIN WITH MINERALS) TABS Take 1 tablet by mouth daily.    . NON FORMULARY Vita fusion gummie-Take 5 in am and 5 in evening    . NON FORMULARY Mangostein capsule-Take 3 pills in am and 2 pills in pm.     Current Facility-Administered Medications on File Prior to Visit  Medication Dose Route Frequency Provider Last Rate Last Dose  . 0.9 %  sodium chloride infusion  500 mL Intravenous Once Sherrilyn Rist, MD       Allergies  Allergen Reactions  . Amlodipine Other (See Comments)    Per pt - damage to kidney's  . Codeine Nausea And Vomiting  . Ivp Dye [Iodinated Diagnostic Agents]     Nauseated  . Penicillins Nausea And Vomiting    As a child  . Sulfa Antibiotics     nauseated   Social History   Socioeconomic History  . Marital  status: Single    Spouse name: Not on file  . Number of children: Not on file  . Years of education: Not on file  . Highest education level: Not on file  Occupational History  . Not on file  Social Needs  . Financial resource strain: Not on file  . Food insecurity:    Worry: Not on file    Inability: Not on file  . Transportation needs:    Medical: Not on file    Non-medical: Not on file  Tobacco Use  . Smoking status: Never Smoker  . Smokeless tobacco: Never Used  Substance and Sexual Activity  . Alcohol use: No  . Drug use: No  . Sexual activity: Not on file  Lifestyle  . Physical activity:    Days per week: Not on file    Minutes per session: Not on file  . Stress: Not on file  Relationships  . Social connections:    Talks on phone: Not on file    Gets together: Not on file    Attends religious service: Not on file    Active member of club or organization: Not on file    Attends meetings of clubs or organizations: Not on file    Relationship status: Not on file  . Intimate partner violence:    Fear of current or ex partner: Not on file    Emotionally abused: Not on file    Physically abused: Not on file    Forced sexual activity: Not on file  Other Topics Concern  . Not on file  Social History Narrative  . Not on file      Review of Systems  All other systems reviewed and are negative.      Objective:   Physical Exam Vitals signs reviewed.  Cardiovascular:     Rate and Rhythm: Normal rate and regular rhythm.     Pulses: Normal pulses.     Heart sounds: Normal heart sounds.  Pulmonary:     Effort: Pulmonary effort is normal.     Breath sounds: Normal breath sounds.  Abdominal:     General: Bowel sounds are normal. There is distension.     Palpations: Abdomen is soft.     Tenderness: There is no abdominal tenderness. There is no guarding.     Skin tag in left axilla      Assessment & Plan:  Skin tag in left axilla-anesthetized with 0.1%  lidocaine with epinephrine.  Using sterile technique skin tag was removed with a scalpel.  Hemostasis was achieved with Drysol and a Band-Aid  Abdominal bloating and constipation.  Trial Linzess 290 mcg p.o. daily for 2 to 3 days.  This should resolve his bloating and constipation and crampy lower abdominal pain.  If he gets diarrhea back off on the dose of Linzess and switch to MiraLAX.  If he develops fevers or chills, may need to consider diverticulitis.

## 2018-05-04 ENCOUNTER — Other Ambulatory Visit: Payer: Self-pay | Admitting: Family Medicine

## 2018-05-04 ENCOUNTER — Telehealth: Payer: Self-pay | Admitting: Family Medicine

## 2018-05-04 MED ORDER — CIPROFLOXACIN HCL 500 MG PO TABS: 500.0000 mg | ORAL_TABLET | Freq: Two times a day (BID) | ORAL | 0 refills | Status: DC

## 2018-05-04 MED ORDER — METRONIDAZOLE 500 MG PO TABS: 500.0000 mg | ORAL_TABLET | Freq: Two times a day (BID) | ORAL | 0 refills | Status: DC

## 2018-05-04 NOTE — Telephone Encounter (Signed)
Patients diverticulitis is still not better, would like some advice on what he should do, he mentioned an antibiotic  [phone]

## 2018-05-04 NOTE — Telephone Encounter (Signed)
Patient aware of providers recommendations via vm 

## 2018-05-04 NOTE — Telephone Encounter (Signed)
cipro and flagyl I will escribe.

## 2018-10-29 ENCOUNTER — Other Ambulatory Visit: Payer: Self-pay

## 2018-10-29 ENCOUNTER — Other Ambulatory Visit: Payer: 59

## 2018-10-29 DIAGNOSIS — I10 Essential (primary) hypertension: Secondary | ICD-10-CM

## 2018-10-29 DIAGNOSIS — E119 Type 2 diabetes mellitus without complications: Secondary | ICD-10-CM

## 2018-10-29 DIAGNOSIS — E782 Mixed hyperlipidemia: Secondary | ICD-10-CM

## 2018-10-30 LAB — COMPREHENSIVE METABOLIC PANEL
AG Ratio: 1.7 (calc) (ref 1.0–2.5)
ALT: 21 U/L (ref 9–46)
AST: 18 U/L (ref 10–35)
Albumin: 4 g/dL (ref 3.6–5.1)
Alkaline phosphatase (APISO): 45 U/L (ref 35–144)
BUN/Creatinine Ratio: 14 (calc) (ref 6–22)
BUN: 22 mg/dL (ref 7–25)
CO2: 25 mmol/L (ref 20–32)
Calcium: 9.4 mg/dL (ref 8.6–10.3)
Chloride: 107 mmol/L (ref 98–110)
Creat: 1.52 mg/dL — ABNORMAL HIGH (ref 0.70–1.33)
Globulin: 2.3 g/dL (calc) (ref 1.9–3.7)
Glucose, Bld: 145 mg/dL — ABNORMAL HIGH (ref 65–99)
Potassium: 4.7 mmol/L (ref 3.5–5.3)
Sodium: 141 mmol/L (ref 135–146)
Total Bilirubin: 0.4 mg/dL (ref 0.2–1.2)
Total Protein: 6.3 g/dL (ref 6.1–8.1)

## 2018-10-30 LAB — CBC WITH DIFFERENTIAL/PLATELET
Absolute Monocytes: 472 cells/uL (ref 200–950)
Basophils Absolute: 53 cells/uL (ref 0–200)
Basophils Relative: 0.9 %
Eosinophils Absolute: 242 cells/uL (ref 15–500)
Eosinophils Relative: 4.1 %
HCT: 42.8 % (ref 38.5–50.0)
Hemoglobin: 14 g/dL (ref 13.2–17.1)
Lymphs Abs: 2490 cells/uL (ref 850–3900)
MCH: 27.9 pg (ref 27.0–33.0)
MCHC: 32.7 g/dL (ref 32.0–36.0)
MCV: 85.3 fL (ref 80.0–100.0)
MPV: 9.8 fL (ref 7.5–12.5)
Monocytes Relative: 8 %
Neutro Abs: 2643 cells/uL (ref 1500–7800)
Neutrophils Relative %: 44.8 %
Platelets: 240 10*3/uL (ref 140–400)
RBC: 5.02 10*6/uL (ref 4.20–5.80)
RDW: 13.2 % (ref 11.0–15.0)
Total Lymphocyte: 42.2 %
WBC: 5.9 10*3/uL (ref 3.8–10.8)

## 2018-10-30 LAB — HEMOGLOBIN A1C
Hgb A1c MFr Bld: 7 % of total Hgb — ABNORMAL HIGH (ref <5.7)
Mean Plasma Glucose: 154 (calc)
eAG (mmol/L): 8.5 (calc)

## 2018-10-30 LAB — LIPID PANEL
Cholesterol: 161 mg/dL (ref <200)
HDL: 27 mg/dL — ABNORMAL LOW (ref >=40)
LDL Cholesterol (Calc): 103 mg/dL (calc) — ABNORMAL HIGH
Non-HDL Cholesterol (Calc): 134 mg/dL (calc) — ABNORMAL HIGH (ref <130)
Total CHOL/HDL Ratio: 6 (calc) — ABNORMAL HIGH (ref <5.0)
Triglycerides: 190 mg/dL — ABNORMAL HIGH (ref <150)

## 2018-11-03 ENCOUNTER — Ambulatory Visit (INDEPENDENT_AMBULATORY_CARE_PROVIDER_SITE_OTHER): Payer: 59 | Admitting: Family Medicine

## 2018-11-03 ENCOUNTER — Other Ambulatory Visit: Payer: Self-pay

## 2018-11-03 VITALS — BP 150/90 | HR 82 | Temp 97.8°F | Resp 18 | Ht 71.0 in | Wt 233.0 lb

## 2018-11-03 DIAGNOSIS — N183 Chronic kidney disease, stage 3 unspecified: Secondary | ICD-10-CM

## 2018-11-03 DIAGNOSIS — Z23 Encounter for immunization: Secondary | ICD-10-CM

## 2018-11-03 DIAGNOSIS — E782 Mixed hyperlipidemia: Secondary | ICD-10-CM | POA: Diagnosis not present

## 2018-11-03 DIAGNOSIS — E119 Type 2 diabetes mellitus without complications: Secondary | ICD-10-CM

## 2018-11-03 DIAGNOSIS — I10 Essential (primary) hypertension: Secondary | ICD-10-CM

## 2018-11-03 MED ORDER — SITAGLIPTIN PHOSPHATE 100 MG PO TABS: 100.0000 mg | ORAL_TABLET | Freq: Every day | ORAL | 3 refills | Status: DC

## 2018-11-03 MED ORDER — SITAGLIPTIN PHOSPHATE 100 MG PO TABS: 100.0000 mg | ORAL_TABLET | Freq: Every day | ORAL | 5 refills | Status: DC

## 2018-11-03 MED ORDER — LOSARTAN POTASSIUM 100 MG PO TABS: 100.0000 mg | ORAL_TABLET | Freq: Every day | ORAL | 3 refills | Status: DC

## 2018-11-03 NOTE — Progress Notes (Signed)
Subjective:    Patient ID: Shawn BanJoseph R Malanga Jr., male    DOB: 07-Nov-1960, 58 y.o.   MRN: [id number]  Medication Refill    As shown on his most recent blood work below, his diabetes has gotten worse.  His hemoglobin A1c is up to 7.  I am also concerned because his creatinine has worsened.  Creatinine is again above 1.5.  Patient denies taking any NSAID.  He does take extra strength Tylenol but he is avoiding anti-inflammatories.  He denies any dehydration.  His blood pressure today is elevated at 152 systolic.  He denies any chest pain, shortness of breath, dyspnea on exertion.  He denies any myalgias or right upper quadrant pain.  He does have occasional neuropathy in the big toe of his left foot.  However he has good normal dorsalis pedis pulses and posterior tibialis pulses bilaterally and normal sensation on exam.  He is due for his flu shot today Most recent labs are below: Lab on 10/29/2018  Component Date Value Ref Range Status  . Hgb A1c MFr Bld 10/29/2018 7.0* <5.7 % of total Hgb Final   Comment: For someone without known diabetes, a hemoglobin A1c value of 6.5% or greater indicates that they may have  diabetes and this should be confirmed with a follow-up  test. . For someone with known diabetes, a value <7% indicates  that their diabetes is well controlled and a value  greater than or equal to 7% indicates suboptimal  control. A1c targets should be individualized based on  duration of diabetes, age, comorbid conditions, and  other considerations. . Currently, no consensus exists regarding use of hemoglobin A1c for diagnosis of diabetes for children. .   . Mean Plasma Glucose 10/29/2018 154  (calc) Final  . eAG (mmol/L) 10/29/2018 8.5  (calc) Final  . Cholesterol 10/29/2018 161  <200 mg/dL Final  . HDL 40/98/119108/27/2020 27* > OR = 40 mg/dL Final  . Triglycerides 10/29/2018 190* <150 mg/dL Final  . LDL Cholesterol (Calc) 10/29/2018 103* mg/dL (calc) Final   Comment: Reference  range: <100 . Desirable range <100 mg/dL for primary prevention;   <70 mg/dL for patients with CHD or diabetic patients  with > or = 2 CHD risk factors. Marland Kitchen. LDL-C is now calculated using the Martin-Hopkins  calculation, which is a validated novel method providing  better accuracy than the Friedewald equation in the  estimation of LDL-C.  Horald PollenMartin SS et al. Lenox AhrJAMA. 4782;956(212013;310(19): 2061-2068  (http://education.[facility].com/faq/FAQ164)   . Total CHOL/HDL Ratio 10/29/2018 6.0* <3.0<5.0 (calc) Final  . Non-HDL Cholesterol (Calc) 10/29/2018 134* <130 mg/dL (calc) Final   Comment: For patients with diabetes plus 1 major ASCVD risk  factor, treating to a non-HDL-C goal of <100 mg/dL  (LDL-C of <86<70 mg/dL) is considered a therapeutic  option.   . Glucose, Bld 10/29/2018 145* 65 - 99 mg/dL Final   Comment: .            Fasting reference interval . For someone without known diabetes, a glucose value >125 mg/dL indicates that they may have diabetes and this should be confirmed with a follow-up test. .   . BUN 10/29/2018 22  7 - 25 mg/dL Final  . Creat 57/84/696208/27/2020 1.52* 0.70 - 1.33 mg/dL Final   Comment: For patients >57 years of age, the reference limit for Creatinine is approximately 13% higher for people identified as African-American. .   [name]. BUN/Creatinine Ratio 10/29/2018 14  6 - 22 (calc) Final  . Sodium 10/29/2018  141  135 - 146 mmol/L Final  . Potassium 10/29/2018 4.7  3.5 - 5.3 mmol/L Final  . Chloride 10/29/2018 107  98 - 110 mmol/L Final  . CO2 10/29/2018 25  20 - 32 mmol/L Final  . Calcium 10/29/2018 9.4  8.6 - 10.3 mg/dL Final  . Total Protein 10/29/2018 6.3  6.1 - 8.1 g/dL Final  . Albumin 67/67/2094 4.0  3.6 - 5.1 g/dL Final  . Globulin 70/96/2836 2.3  1.9 - 3.7 g/dL (calc) Final  . AG Ratio 10/29/2018 1.7  1.0 - 2.5 (calc) Final  . Total Bilirubin 10/29/2018 0.4  0.2 - 1.2 mg/dL Final  . Alkaline phosphatase (APISO) 10/29/2018 45  35 - 144 U/L Final  . AST 10/29/2018 18   10 - 35 U/L Final  . ALT 10/29/2018 21  9 - 46 U/L Final  . WBC 10/29/2018 5.9  3.8 - 10.8 Thousand/uL Final  . RBC 10/29/2018 5.02  4.20 - 5.80 Million/uL Final  . Hemoglobin 10/29/2018 14.0  13.2 - 17.1 g/dL Final  . HCT 62/94/7654 42.8  38.5 - 50.0 % Final  . MCV 10/29/2018 85.3  80.0 - 100.0 fL Final  . MCH 10/29/2018 27.9  27.0 - 33.0 pg Final  . MCHC 10/29/2018 32.7  32.0 - 36.0 g/dL Final  . RDW 65/05/5463 13.2  11.0 - 15.0 % Final  . Platelets 10/29/2018 240  140 - 400 Thousand/uL Final  . MPV 10/29/2018 9.8  7.5 - 12.5 fL Final  . Neutro Abs 10/29/2018 2,643  1,500 - 7,800 cells/uL Final  . Lymphs Abs 10/29/2018 2,490  850 - 3,900 cells/uL Final  . Absolute Monocytes 10/29/2018 472  200 - 950 cells/uL Final  . Eosinophils Absolute 10/29/2018 242  15 - 500 cells/uL Final  . Basophils Absolute 10/29/2018 53  0 - 200 cells/uL Final  . Neutrophils Relative % 10/29/2018 44.8  % Final  . Total Lymphocyte 10/29/2018 42.2  % Final  . Monocytes Relative 10/29/2018 8.0  % Final  . Eosinophils Relative 10/29/2018 4.1  % Final  . Basophils Relative 10/29/2018 0.9  % Final    Past Medical History:  Diagnosis Date  . Allergy   . Arthritis   . Chronic bronchitis   . Chronic pain    per pt/ right shoulder pain, left leg and ankle  . CKD (chronic kidney disease) stage 3, GFR 30-59 ml/min (HCC)   . Costochondral chest pain    due to torn tendons.  . CTS (carpal tunnel syndrome)    right hand  . Diabetes mellitus without complication (HCC)    prediabetes/ no meds  . Diverticulitis   . GERD (gastroesophageal reflux disease)   . History of anal fissures   . Hyperlipemia   . Hypertension   . Rectal bleeding    occasional  . Seasonal allergies    Past Surgical History:  Procedure Laterality Date  . ANKLE FRACTURE SURGERY  2005 / 2007   left ankle and  left leg/ have screws in leg and ankle  . EXAMINATION UNDER ANESTHESIA N/A 12/15/2012   Procedure: EXAM UNDER ANESTHESIA;   Surgeon: Atilano Ina, MD;  Location: [facility];  Service: General;  Laterality: N/A;  . FRACTURE SURGERY  2005 and 2007   lt ankle x2  . HEMORRHOIDECTOMY WITH HEMORRHOID BANDING N/A 12/15/2012   Procedure: EXCISIONAL HEMORRHOIDECTOMY WITH HEMORRHOID BANDING;  Surgeon: Atilano Ina, MD;  Location: [facility];  Service: General;  Laterality: N/A;  .  rotator cuff surgery      x 2/ right shoulder  . TRIGGER FINGER RELEASE  07/23/2011   Procedure: Right hand- RELEASE TRIGGER FINGER/A-1 PULLEY;  Surgeon: Wyn Forsterobert V Sypher Jr., MD;  Location: [facility];  Service: Orthopedics;  Laterality: Right;  . WRIST FRACTURE SURGERY  1984   lt with bone graft  . WRIST SURGERY     left wrist   Current Outpatient Medications on File Prior to Visit  Medication Sig Dispense Refill  . APPLE CID VN-GRN TEA-BIT OR-CR PO     . aspirin EC 81 MG tablet Take 81 mg by mouth daily.    . Biotin 10 MG TABS Take by mouth. Biotin 10 mg every other day    . calcium carbonate (OS-CAL) 600 MG TABS Take 600 mg by mouth. Every other day    . carvedilol (COREG) 12.5 MG tablet TAKE 1 TABLET BY MOUTH TWICE DAILY WITH A MEAL 180 tablet 3  . cetirizine (ZYRTEC) 10 MG tablet Take 1 tablet (10 mg total) by mouth daily. 90 tablet 3  . Cinnamon 500 MG capsule Take 500 mg by mouth 3 (three) times daily.     Marland Kitchen. EXTRA STRENGTH ACETAMINOPHEN PO     . fenofibrate 160 MG tablet Take 1 tablet (160 mg total) by mouth daily. 90 tablet 3  . fish oil-omega-3 fatty acids 1000 MG capsule Take by mouth daily. Take 2 pills in am and 1 pill at night.    . fluticasone (FLONASE) 50 MCG/ACT nasal spray Place 2 sprays into both nostrils daily. 16 g 5  . Glucosamine 500 MG CAPS Take by mouth. Glucosamine 1000 mg bid    . glucosamine-chondroitin 500-400 MG tablet Take 1 tablet by mouth 3 (three) times daily.    Marland Kitchen. L-LYSINE PO Take by mouth daily at 6 (six) AM.    . losartan (COZAAR) 50 MG tablet Take 1 tablet (50  mg total) by mouth daily. 90 tablet 3  . LUTEIN PO Take 1 tablet by mouth daily.    . mometasone (ELOCON) 0.1 % cream Apply 1 application topically daily. 45 g 0  . Multiple Vitamin (MULITIVITAMIN WITH MINERALS) TABS Take 1 tablet by mouth daily.    . NON FORMULARY Vita fusion gummie-Take 5 in am and 5 in evening    . NON FORMULARY Mangostein capsule-Take 3 pills in am and 2 pills in pm.    . omeprazole (PRILOSEC) 20 MG capsule Take 1 capsule (20 mg total) by mouth 2 (two) times daily before a meal. 180 capsule 3  . simvastatin (ZOCOR) 20 MG tablet Take 1 tablet (20 mg total) by mouth every evening. 90 tablet 3   Current Facility-Administered Medications on File Prior to Visit  Medication Dose Route Frequency Provider Last Rate Last Dose  . 0.9 %  sodium chloride infusion  500 mL Intravenous Once Sherrilyn Ristanis, Henry L III, MD       Allergies  Allergen Reactions  . Amlodipine Other (See Comments)    Per pt - damage to kidney's  . Codeine Nausea And Vomiting  . Ivp Dye [Iodinated Diagnostic Agents]     Nauseated  . Penicillins Nausea And Vomiting    As a child  . Sulfa Antibiotics     nauseated   Social History   Socioeconomic History  . Marital status: Single    Spouse name: Not on file  . Number of children: Not on file  . Years of education: Not on file  .  Highest education level: Not on file  Occupational History  . Not on file  Social Needs  . Financial resource strain: Not on file  . Food insecurity    Worry: Not on file    Inability: Not on file  . Transportation needs    Medical: Not on file    Non-medical: Not on file  Tobacco Use  . Smoking status: Never Smoker  . Smokeless tobacco: Never Used  Substance and Sexual Activity  . Alcohol use: No  . Drug use: No  . Sexual activity: Not on file  Lifestyle  . Physical activity    Days per week: Not on file    Minutes per session: Not on file  . Stress: Not on file  Relationships  . Social Musician on  phone: Not on file    Gets together: Not on file    Attends religious service: Not on file    Active member of club or organization: Not on file    Attends meetings of clubs or organizations: Not on file    Relationship status: Not on file  . Intimate partner violence    Fear of current or ex partner: Not on file    Emotionally abused: Not on file    Physically abused: Not on file    Forced sexual activity: Not on file  Other Topics Concern  . Not on file  Social History Narrative  . Not on file   Family History  Problem Relation Age of Onset  . Heart disease Mother   . Hyperlipidemia Mother   . Diabetes Mother   . Macular degeneration Mother   . Cancer Father        skin  . Macular degeneration Sister   . Diabetes Brother   . Colon cancer Neg Hx   . Stomach cancer Neg Hx   . Esophageal cancer Neg Hx   . Rectal cancer Neg Hx       Review of Systems  All other systems reviewed and are negative.      Objective:   Physical Exam  Constitutional: He is oriented to person, place, and time. He appears well-developed and well-nourished. No distress.  HENT:  Right Ear: External ear normal.  Left Ear: External ear normal.  Nose: Nose normal.  Mouth/Throat: Oropharynx is clear and moist. No oropharyngeal exudate.  Eyes: Pupils are equal, round, and reactive to light. Conjunctivae are normal.  Neck: Neck supple. No JVD present. No thyromegaly present.  Cardiovascular: Normal rate, regular rhythm, normal heart sounds and intact distal pulses. Exam reveals no gallop and no friction rub.  No murmur heard. Pulmonary/Chest: Effort normal and breath sounds normal. No respiratory distress. He has no wheezes. He has no rales. He exhibits no tenderness.  Abdominal: Soft. Bowel sounds are normal. He exhibits no distension and no mass. There is no abdominal tenderness. There is no rebound and no guarding.  Musculoskeletal: Normal range of motion.        General: Tenderness present. No  deformity or edema.  Lymphadenopathy:    He has no cervical adenopathy.  Neurological: He is alert and oriented to person, place, and time. He has normal reflexes. No cranial nerve deficit. Coordination normal.  Skin: He is not diaphoretic.  Psychiatric: He has a normal mood and affect. His behavior is normal. Judgment and thought content normal.  Vitals reviewed.       Controlled type 2 diabetes mellitus without complication, without long-term current use  of insulin (HCC)  Mixed hyperlipidemia  Benign essential HTN  CKD (chronic kidney disease) stage 3, GFR 30-59 ml/min (HCC)  After discussion I have recommended starting Januvia 100 mg a day for his diabetes and rechecking a hemoglobin A1c in 3 months.  I recommended increasing his losartan to 100 mg a day.  Patient would like to work on diet exercise and weight loss to help lower his LDL cholesterol.  He received his flu shot today.

## 2018-11-03 NOTE — Addendum Note (Signed)
Addended by: Shary Decamp B on: 11/03/2018 10:15 AM   Modules accepted: Orders

## 2018-12-29 ENCOUNTER — Telehealth: Payer: Self-pay | Admitting: Family Medicine

## 2018-12-29 MED ORDER — CIPROFLOXACIN HCL 500 MG PO TABS: 500.0000 mg | ORAL_TABLET | Freq: Two times a day (BID) | ORAL | 0 refills | Status: DC

## 2018-12-29 MED ORDER — METRONIDAZOLE 500 MG PO TABS: 500.0000 mg | ORAL_TABLET | Freq: Two times a day (BID) | ORAL | 0 refills | Status: DC

## 2018-12-29 NOTE — Telephone Encounter (Signed)
Pt states that he does not have constipation and he knows it is diverticulitis. Per [facility] send in antibx as listed below. Med sent to pharm and pt aware.

## 2018-12-29 NOTE — Telephone Encounter (Signed)
First I would try treating constipation with Dulcolax.  If the pain intensifies despite treating the constipation he can use Cipro 500 mg p.o. twice daily for 10 days and Flagyl 500 mg p.o. twice daily for 10 days

## 2018-12-29 NOTE — Telephone Encounter (Signed)
Pt called and LMOVM stating he would like something called in for his Diverticulitis flare. He is having left sided pain, some constipation. He states that he has had this in the past and we sent him in medication for this.

## 2019-02-01 ENCOUNTER — Other Ambulatory Visit: Payer: Self-pay

## 2019-02-01 ENCOUNTER — Other Ambulatory Visit: Payer: 59

## 2019-02-01 DIAGNOSIS — E119 Type 2 diabetes mellitus without complications: Secondary | ICD-10-CM

## 2019-02-02 ENCOUNTER — Other Ambulatory Visit: Payer: 59

## 2019-02-02 LAB — HEMOGLOBIN A1C
Hgb A1c MFr Bld: 6.5 % of total Hgb — ABNORMAL HIGH (ref <5.7)
Mean Plasma Glucose: 140 (calc)
eAG (mmol/L): 7.7 (calc)

## 2019-04-15 ENCOUNTER — Telehealth: Payer: Self-pay | Admitting: Family Medicine

## 2019-04-15 NOTE — Telephone Encounter (Signed)
Patient says [facility] on [address] is having an issue with his rx for carvedilol  [phone] would like a call back regarding this issue

## 2019-04-16 ENCOUNTER — Other Ambulatory Visit: Payer: Self-pay

## 2019-04-16 MED ORDER — CARVEDILOL 12.5 MG PO TABS: ORAL_TABLET | ORAL | 1 refills | Status: DC

## 2019-04-16 NOTE — Telephone Encounter (Signed)
LMTRC

## 2019-04-19 ENCOUNTER — Other Ambulatory Visit: Payer: Self-pay

## 2019-04-19 ENCOUNTER — Other Ambulatory Visit: Payer: 59

## 2019-04-19 DIAGNOSIS — E119 Type 2 diabetes mellitus without complications: Secondary | ICD-10-CM

## 2019-04-19 DIAGNOSIS — E782 Mixed hyperlipidemia: Secondary | ICD-10-CM

## 2019-04-19 DIAGNOSIS — I10 Essential (primary) hypertension: Secondary | ICD-10-CM

## 2019-04-19 DIAGNOSIS — N183 Chronic kidney disease, stage 3 unspecified: Secondary | ICD-10-CM

## 2019-04-21 LAB — COMPREHENSIVE METABOLIC PANEL
AG Ratio: 1.6 (calc) (ref 1.0–2.5)
ALT: 25 U/L (ref 9–46)
AST: 19 U/L (ref 10–35)
Albumin: 4.1 g/dL (ref 3.6–5.1)
Alkaline phosphatase (APISO): 42 U/L (ref 35–144)
BUN/Creatinine Ratio: 11 (calc) (ref 6–22)
BUN: 19 mg/dL (ref 7–25)
CO2: 24 mmol/L (ref 20–32)
Calcium: 9.5 mg/dL (ref 8.6–10.3)
Chloride: 106 mmol/L (ref 98–110)
Creat: 1.67 mg/dL — ABNORMAL HIGH (ref 0.70–1.33)
Globulin: 2.5 g/dL (calc) (ref 1.9–3.7)
Glucose, Bld: 130 mg/dL — ABNORMAL HIGH (ref 65–99)
Potassium: 4.3 mmol/L (ref 3.5–5.3)
Sodium: 140 mmol/L (ref 135–146)
Total Bilirubin: 0.5 mg/dL (ref 0.2–1.2)
Total Protein: 6.6 g/dL (ref 6.1–8.1)

## 2019-04-21 LAB — CBC WITH DIFFERENTIAL/PLATELET
Absolute Monocytes: 491 cells/uL (ref 200–950)
Basophils Absolute: 50 cells/uL (ref 0–200)
Basophils Relative: 0.8 %
Eosinophils Absolute: 170 cells/uL (ref 15–500)
Eosinophils Relative: 2.7 %
HCT: 45.4 % (ref 38.5–50.0)
Hemoglobin: 15.1 g/dL (ref 13.2–17.1)
Lymphs Abs: 2255 cells/uL (ref 850–3900)
MCH: 28 pg (ref 27.0–33.0)
MCHC: 33.3 g/dL (ref 32.0–36.0)
MCV: 84.1 fL (ref 80.0–100.0)
MPV: 9.7 fL (ref 7.5–12.5)
Monocytes Relative: 7.8 %
Neutro Abs: 3333 cells/uL (ref 1500–7800)
Neutrophils Relative %: 52.9 %
Platelets: 282 10*3/uL (ref 140–400)
RBC: 5.4 10*6/uL (ref 4.20–5.80)
RDW: 13.1 % (ref 11.0–15.0)
Total Lymphocyte: 35.8 %
WBC: 6.3 10*3/uL (ref 3.8–10.8)

## 2019-04-21 LAB — LIPID PANEL
Cholesterol: 147 mg/dL (ref <200)
HDL: 26 mg/dL — ABNORMAL LOW (ref >=40)
LDL Cholesterol (Calc): 96 mg/dL (calc)
Non-HDL Cholesterol (Calc): 121 mg/dL (calc) (ref <130)
Total CHOL/HDL Ratio: 5.7 (calc) — ABNORMAL HIGH (ref <5.0)
Triglycerides: 146 mg/dL (ref <150)

## 2019-04-21 LAB — HEMOGLOBIN A1C W/OUT EAG: Hgb A1c MFr Bld: 6.6 % of total Hgb — ABNORMAL HIGH (ref <5.7)

## 2019-04-21 LAB — TEST AUTHORIZATION

## 2019-04-21 NOTE — Telephone Encounter (Signed)
LMTRC

## 2019-04-23 ENCOUNTER — Encounter: Payer: 59 | Admitting: Family Medicine

## 2019-04-29 ENCOUNTER — Ambulatory Visit (INDEPENDENT_AMBULATORY_CARE_PROVIDER_SITE_OTHER): Payer: 59 | Admitting: Family Medicine

## 2019-04-29 ENCOUNTER — Other Ambulatory Visit: Payer: Self-pay

## 2019-04-29 ENCOUNTER — Encounter: Payer: Self-pay | Admitting: Family Medicine

## 2019-04-29 VITALS — BP 128/80 | HR 88 | Temp 97.8°F | Resp 16 | Ht 71.0 in | Wt 234.0 lb

## 2019-04-29 DIAGNOSIS — Z0000 Encounter for general adult medical examination without abnormal findings: Secondary | ICD-10-CM

## 2019-04-29 DIAGNOSIS — E782 Mixed hyperlipidemia: Secondary | ICD-10-CM

## 2019-04-29 DIAGNOSIS — I10 Essential (primary) hypertension: Secondary | ICD-10-CM

## 2019-04-29 DIAGNOSIS — E119 Type 2 diabetes mellitus without complications: Secondary | ICD-10-CM | POA: Diagnosis not present

## 2019-04-29 DIAGNOSIS — Z125 Encounter for screening for malignant neoplasm of prostate: Secondary | ICD-10-CM | POA: Diagnosis not present

## 2019-04-29 DIAGNOSIS — N183 Chronic kidney disease, stage 3 unspecified: Secondary | ICD-10-CM

## 2019-04-29 LAB — PSA: PSA: 0.4 ng/mL (ref <=4.0)

## 2019-04-29 NOTE — Progress Notes (Signed)
Subjective:    Patient ID: Shawn Ban., male    DOB: 03/26/1960, 59 y.o.   MRN: [id number]  HPI Patient is here today for complete physical exam.  He complains of pain and burning neuropathy in both hands.  He has previously been diagnosed with carpal tunnel syndrome but he would like to see an orthopedist regarding this once the COVID-19 pandemic improves.  He also has trigger fingers primarily in his left hand.  He reports that the third digit will lock on his left hand whenever he makes a fist.  He also occasionally has a trigger finger in his right index finger.  Otherwise he is doing well.  His blood pressure today is well controlled at 128/80.  He denies any chest pain shortness of breath or dyspnea on exertion.  His colonoscopy was performed in April 2019 and is normal.  He is due for prostate cancer screening with a PSA.  He denies any lower urinary tract symptoms.  His diabetes is well controlled with hemoglobin A1c of 6.6.  He denies any polyuria, polydipsia, blurry vision.  Diabetic foot exam was performed today and was normal aside for some diminished sensation to 10 g monofilament in the left great toe.  Otherwise he has no concerns.  Please see his most recent lab work listed below. His most recent lab work as listed below.:  No visits with results within 1 Week(s) from this visit.  Latest known visit with results is:  Lab on 04/19/2019  Component Date Value Ref Range Status  . WBC 04/19/2019 6.3  3.8 - 10.8 Thousand/uL Final  . RBC 04/19/2019 5.40  4.20 - 5.80 Million/uL Final  . Hemoglobin 04/19/2019 15.1  13.2 - 17.1 g/dL Final  . HCT 40/98/1191 45.4  38.5 - 50.0 % Final  . MCV 04/19/2019 84.1  80.0 - 100.0 fL Final  . MCH 04/19/2019 28.0  27.0 - 33.0 pg Final  . MCHC 04/19/2019 33.3  32.0 - 36.0 g/dL Final  . RDW 47/82/9562 13.1  11.0 - 15.0 % Final  . Platelets 04/19/2019 282  140 - 400 Thousand/uL Final  . MPV 04/19/2019 9.7  7.5 - 12.5 fL Final  . Neutro Abs  04/19/2019 3,333  1,500 - 7,800 cells/uL Final  . Lymphs Abs 04/19/2019 2,255  850 - 3,900 cells/uL Final  . Absolute Monocytes 04/19/2019 491  200 - 950 cells/uL Final  . Eosinophils Absolute 04/19/2019 170  15 - 500 cells/uL Final  . Basophils Absolute 04/19/2019 50  0 - 200 cells/uL Final  . Neutrophils Relative % 04/19/2019 52.9  % Final  . Total Lymphocyte 04/19/2019 35.8  % Final  . Monocytes Relative 04/19/2019 7.8  % Final  . Eosinophils Relative 04/19/2019 2.7  % Final  . Basophils Relative 04/19/2019 0.8  % Final  . Glucose, Bld 04/19/2019 130* 65 - 99 mg/dL Final   Comment: .            Fasting reference interval . For someone without known diabetes, a glucose value >125 mg/dL indicates that they may have diabetes and this should be confirmed with a follow-up test. .   . BUN 04/19/2019 19  7 - 25 mg/dL Final  . Creat 13/10/6576 1.67* 0.70 - 1.33 mg/dL Final   Comment: For patients >83 years of age, the reference limit for Creatinine is approximately 13% higher for people identified as African-American. .   [name] Ratio 04/19/2019 11  6 - 22 (calc) Final  .  Sodium 04/19/2019 140  135 - 146 mmol/L Final  . Potassium 04/19/2019 4.3  3.5 - 5.3 mmol/L Final  . Chloride 04/19/2019 106  98 - 110 mmol/L Final  . CO2 04/19/2019 24  20 - 32 mmol/L Final  . Calcium 04/19/2019 9.5  8.6 - 10.3 mg/dL Final  . Total Protein 04/19/2019 6.6  6.1 - 8.1 g/dL Final  . Albumin 29/92/4268 4.1  3.6 - 5.1 g/dL Final  . Globulin 34/19/6222 2.5  1.9 - 3.7 g/dL (calc) Final  . AG Ratio 04/19/2019 1.6  1.0 - 2.5 (calc) Final  . Total Bilirubin 04/19/2019 0.5  0.2 - 1.2 mg/dL Final  . Alkaline phosphatase (APISO) 04/19/2019 42  35 - 144 U/L Final  . AST 04/19/2019 19  10 - 35 U/L Final  . ALT 04/19/2019 25  9 - 46 U/L Final  . Cholesterol 04/19/2019 147  <200 mg/dL Final  . HDL 97/98/9211 26* > OR = 40 mg/dL Final  . Triglycerides 04/19/2019 146  <150 mg/dL Final  . LDL  Cholesterol (Calc) 04/19/2019 96  mg/dL (calc) Final   Comment: Reference range: <100 . Desirable range <100 mg/dL for primary prevention;   <70 mg/dL for patients with CHD or diabetic patients  with > or = 2 CHD risk factors. Marland Kitchen LDL-C is now calculated using the Martin-Hopkins  calculation, which is a validated novel method providing  better accuracy than the Friedewald equation in the  estimation of LDL-C.  Horald Pollen et al. Lenox Ahr. 9417;408(14): 2061-2068  (http://education.[facility].com/faq/FAQ164)   . Total CHOL/HDL Ratio 04/19/2019 5.7* <4.8 (calc) Final  . Non-HDL Cholesterol (Calc) 04/19/2019 121  <130 mg/dL (calc) Final   Comment: For patients with diabetes plus 1 major ASCVD risk  factor, treating to a non-HDL-C goal of <100 mg/dL  (LDL-C of <18 mg/dL) is considered a therapeutic  option.   . Hgb A1c MFr Bld 04/19/2019 6.6* <5.7 % of total Hgb Final   Comment: For someone without known diabetes, a hemoglobin A1c value of 6.5% or greater indicates that they may have  diabetes and this should be confirmed with a follow-up  test. . For someone with known diabetes, a value <7% indicates  that their diabetes is well controlled and a value  greater than or equal to 7% indicates suboptimal  control. A1c targets should be individualized based on  duration of diabetes, age, comorbid conditions, and  other considerations. . Currently, no consensus exists regarding use of hemoglobin A1c for diagnosis of diabetes for children. .   . TEST NAME: 04/19/2019 HEMOGLOBIN A1c   Final  . TEST CODE: 04/19/2019 [id number]   Final  . CLIENT CONTACT: 04/19/2019 KIM WHILES   Final  . REPORT ALWAYS MESSAGE SIGNATURE 04/19/2019    Final   Comment: . The laboratory testing on this patient was verbally requested or confirmed by the ordering physician or his or her authorized representative after contact with an employee of [employer]. Federal regulations require that we maintain on  file written authorization for all laboratory testing.  Accordingly we are asking that the ordering physician or his or her authorized representative sign a copy of this report and promptly return it to the client service representative. . . Signature:____________________________________________________ . Please fax this signed page to [phone] or return it via your [employer] courier.     Past Medical History:  Diagnosis Date  . Allergy   . Arthritis   . Chronic bronchitis   . Chronic pain  per pt/ right shoulder pain, left leg and ankle  . CKD (chronic kidney disease) stage 3, GFR 30-59 ml/min   . Costochondral chest pain    due to torn tendons.  . CTS (carpal tunnel syndrome)    right hand  . Diabetes mellitus without complication ([place])    prediabetes/ no meds  . Diverticulitis   . GERD (gastroesophageal reflux disease)   . History of anal fissures   . Hyperlipemia   . Hypertension   . Rectal bleeding    occasional  . Seasonal allergies    Past Surgical History:  Procedure Laterality Date  . ANKLE FRACTURE SURGERY  2005 / 2007   left ankle and  left leg/ have screws in leg and ankle  . EXAMINATION UNDER ANESTHESIA N/A 12/15/2012   Procedure: EXAM UNDER ANESTHESIA;  Surgeon: Gayland Curry, MD;  Location: [facility];  Service: General;  Laterality: N/A;  . FRACTURE SURGERY  2005 and 2007   lt ankle x2  . HEMORRHOIDECTOMY WITH HEMORRHOID BANDING N/A 12/15/2012   Procedure: EXCISIONAL HEMORRHOIDECTOMY WITH HEMORRHOID BANDING;  Surgeon: Gayland Curry, MD;  Location: [facility];  Service: General;  Laterality: N/A;  . rotator cuff surgery      x 2/ right shoulder  . TRIGGER FINGER RELEASE  07/23/2011   Procedure: Right hand- RELEASE TRIGGER FINGER/A-1 PULLEY;  Surgeon: Cammie Sickle., MD;  Location: [facility];  Service: Orthopedics;  Laterality: Right;  . WRIST FRACTURE SURGERY  1984   lt with bone graft   . WRIST SURGERY     left wrist   Current Outpatient Medications on File Prior to Visit  Medication Sig Dispense Refill  . APPLE CID VN-GRN TEA-BIT OR-CR PO     . aspirin EC 81 MG tablet Take 81 mg by mouth daily.    . Biotin 10 MG TABS Take by mouth. Biotin 10 mg every other day    . calcium carbonate (OS-CAL) 600 MG TABS Take 600 mg by mouth. Every other day    . carvedilol (COREG) 12.5 MG tablet TAKE 1 TABLET BY MOUTH TWICE DAILY WITH A MEAL 180 tablet 1  . cetirizine (ZYRTEC) 10 MG tablet Take 1 tablet (10 mg total) by mouth daily. 90 tablet 3  . Cinnamon 500 MG capsule Take 500 mg by mouth 3 (three) times daily.     Marland Kitchen EXTRA STRENGTH ACETAMINOPHEN PO     . fenofibrate 160 MG tablet Take 1 tablet (160 mg total) by mouth daily. 90 tablet 3  . fish oil-omega-3 fatty acids 1000 MG capsule Take by mouth daily. Take 2 pills in am and 1 pill at night.    . fluticasone (FLONASE) 50 MCG/ACT nasal spray Place 2 sprays into both nostrils daily. 16 g 5  . Glucosamine 500 MG CAPS Take by mouth. Glucosamine 1000 mg bid    . glucosamine-chondroitin 500-400 MG tablet Take 1 tablet by mouth 3 (three) times daily.    Marland Kitchen L-LYSINE PO Take by mouth daily at 6 (six) AM.    . losartan (COZAAR) 100 MG tablet Take 1 tablet (100 mg total) by mouth daily. 90 tablet 3  . LUTEIN PO Take 1 tablet by mouth daily.    . mometasone (ELOCON) 0.1 % cream Apply 1 application topically daily. 45 g 0  . Multiple Vitamin (MULITIVITAMIN WITH MINERALS) TABS Take 1 tablet by mouth daily.    . NON FORMULARY Vita fusion gummie-Take 5 in am  and 5 in evening    . NON FORMULARY Mangostein capsule-Take 3 pills in am and 2 pills in pm.    . omeprazole (PRILOSEC) 20 MG capsule Take 1 capsule (20 mg total) by mouth 2 (two) times daily before a meal. 180 capsule 3  . simvastatin (ZOCOR) 20 MG tablet Take 1 tablet (20 mg total) by mouth every evening. 90 tablet 3  . sitaGLIPtin (JANUVIA) 100 MG tablet Take 1 tablet (100 mg total) by mouth  daily. 30 tablet 3   Current Facility-Administered Medications on File Prior to Visit  Medication Dose Route Frequency Provider Last Rate Last Admin  . 0.9 %  sodium chloride infusion  500 mL Intravenous Once Sherrilyn Rist, MD       Allergies  Allergen Reactions  . Amlodipine Other (See Comments)    Per pt - damage to kidney's  . Codeine Nausea And Vomiting  . Ivp Dye [Iodinated Diagnostic Agents]     Nauseated  . Penicillins Nausea And Vomiting    As a child  . Sulfa Antibiotics     nauseated   Social History   Socioeconomic History  . Marital status: Single    Spouse name: Not on file  . Number of children: Not on file  . Years of education: Not on file  . Highest education level: Not on file  Occupational History  . Not on file  Tobacco Use  . Smoking status: Never Smoker  . Smokeless tobacco: Never Used  Substance and Sexual Activity  . Alcohol use: No  . Drug use: No  . Sexual activity: Not on file  Other Topics Concern  . Not on file  Social History Narrative  . Not on file   Social Determinants of Health   Financial Resource Strain:   . Difficulty of Paying Living Expenses: Not on file  Food Insecurity:   . Worried About Programme researcher, broadcasting/film/video in the Last Year: Not on file  . Ran Out of Food in the Last Year: Not on file  Transportation Needs:   . Lack of Transportation (Medical): Not on file  . Lack of Transportation (Non-Medical): Not on file  Physical Activity:   . Days of Exercise per Week: Not on file  . Minutes of Exercise per Session: Not on file  Stress:   . Feeling of Stress : Not on file  Social Connections:   . Frequency of Communication with Friends and Family: Not on file  . Frequency of Social Gatherings with Friends and Family: Not on file  . Attends Religious Services: Not on file  . Active Member of Clubs or Organizations: Not on file  . Attends Banker Meetings: Not on file  . Marital Status: Not on file  Intimate  Partner Violence:   . Fear of Current or Ex-Partner: Not on file  . Emotionally Abused: Not on file  . Physically Abused: Not on file  . Sexually Abused: Not on file   Family History  Problem Relation Age of Onset  . Heart disease Mother   . Hyperlipidemia Mother   . Diabetes Mother   . Macular degeneration Mother   . Cancer Father        skin  . Macular degeneration Sister   . Diabetes Brother   . Colon cancer Neg Hx   . Stomach cancer Neg Hx   . Esophageal cancer Neg Hx   . Rectal cancer Neg Hx  Review of Systems  All other systems reviewed and are negative.      Objective:   Physical Exam  Constitutional: He is oriented to person, place, and time. He appears well-developed and well-nourished. No distress.  HENT:  Right Ear: External ear normal.  Left Ear: External ear normal.  Nose: Nose normal.  Mouth/Throat: Oropharynx is clear and moist. No oropharyngeal exudate.  Eyes: Pupils are equal, round, and reactive to light. Conjunctivae are normal.  Neck: No JVD present. No thyromegaly present.  Cardiovascular: Normal rate, regular rhythm, normal heart sounds and intact distal pulses. Exam reveals no gallop and no friction rub.  No murmur heard. Pulmonary/Chest: Effort normal and breath sounds normal. No respiratory distress. He has no wheezes. He has no rales. He exhibits no tenderness.  Abdominal: Soft. Bowel sounds are normal. He exhibits no distension and no mass. There is no abdominal tenderness. There is no rebound and no guarding.  Musculoskeletal:        General: Tenderness present. No deformity or edema. Normal range of motion.     Cervical back: Neck supple.  Lymphadenopathy:    He has no cervical adenopathy.  Neurological: He is alert and oriented to person, place, and time. He has normal reflexes. No cranial nerve deficit. Coordination normal.  Skin: He is not diaphoretic.  Psychiatric: He has a normal mood and affect. His behavior is normal. Judgment  and thought content normal.  Vitals reviewed.         Prostate cancer screening - Plan: PSA  Controlled type 2 diabetes mellitus without complication, without long-term current use of insulin (HCC)  Mixed hyperlipidemia  Benign essential HTN  Stage 3 chronic kidney disease, unspecified whether stage 3a or 3b CKD  General medical exam  Physical exam today is normal.  I will check for prostate cancer with a PSA.  Colon cancer screening is up-to-date.  Immunizations are up-to-date.  I did recommend the COVID-19 vaccine when available for the patient.  Diabetes is well controlled with a hemoglobin A1c of 6.6.  LDL cholesterol is well below his goal of 100.  HDL cholesterol remains low which is a chronic problem for this patient.  Recommended 30 minutes a day 5 days a week of aerobic exercise to manage.  Chronic kidney disease is stable.  Recommended that he avoid NSAIDs and drink plenty of water.  The remainder of his preventative care is up-to-date.  Regular anticipatory guidance is provided.  I will be glad to consult with a hand specialist for his carpal tunnel and trigger finger when he requests.

## 2019-05-10 ENCOUNTER — Other Ambulatory Visit: Payer: Self-pay

## 2019-05-10 MED ORDER — OMEPRAZOLE 20 MG PO CPDR: 20.0000 mg | DELAYED_RELEASE_CAPSULE | Freq: Two times a day (BID) | ORAL | 3 refills | Status: DC

## 2019-05-10 MED ORDER — FENOFIBRATE 160 MG PO TABS: 160.0000 mg | ORAL_TABLET | Freq: Every day | ORAL | 3 refills | Status: DC

## 2019-05-10 MED ORDER — SIMVASTATIN 20 MG PO TABS: 20.0000 mg | ORAL_TABLET | Freq: Every evening | ORAL | 3 refills | Status: DC

## 2019-05-10 MED ORDER — SITAGLIPTIN PHOSPHATE 100 MG PO TABS: 100.0000 mg | ORAL_TABLET | Freq: Every day | ORAL | 3 refills | Status: DC

## 2019-05-28 ENCOUNTER — Telehealth (INDEPENDENT_AMBULATORY_CARE_PROVIDER_SITE_OTHER): Payer: 59 | Admitting: Family Medicine

## 2019-05-28 DIAGNOSIS — E1122 Type 2 diabetes mellitus with diabetic chronic kidney disease: Secondary | ICD-10-CM | POA: Diagnosis not present

## 2019-05-28 DIAGNOSIS — N183 Chronic kidney disease, stage 3 unspecified: Secondary | ICD-10-CM | POA: Diagnosis not present

## 2019-05-28 DIAGNOSIS — E119 Type 2 diabetes mellitus without complications: Secondary | ICD-10-CM

## 2019-05-28 MED ORDER — LINAGLIPTIN 5 MG PO TABS: 5.0000 mg | ORAL_TABLET | Freq: Every day | ORAL | 3 refills | Status: DC

## 2019-05-28 NOTE — Progress Notes (Signed)
Subjective:    Patient ID: Shawn Ban., male    DOB: Aug 21, 1960, 59 y.o.   MRN: [id number]  HPI  Patient is being seen today as a telephone visit.  Video call began at 215.  Phone call concluded at 230.  Patient consented to be seen via video conference call over the Internet. Patient has concerns regarding his elevated creatinine.  Of note his creatinine over the last several months is increased from around 1.3-1.6.  Patient is concerned that his Januvia may be the cause of that.  He is not taking any NSAIDs.  He states that he was not taking any NSAIDs at the time.  He is only been taking Tylenol.  He denies any dysuria or urgency or frequency.  He is conducted his own research and found that Januvia can cause acute renal failure.  He did start Januvia around the same time that his creatinine began to decline.  My opinion is that the Januvia is unlikely to cause this.  I believe this is likely due to underlying chronic kidney disease.  Slight alterations can be seen based on dehydration, blood pressure, etc.  Even in 2017, he had a renal ultrasound performed due to chronic kidney disease and therefore this is not a new problem.  I believe that Januvia is likely a coincidental change.  However the patient is also conducted his own research and found that Tradjenta may help protect the kidneys against damage due to diabetes.  I explained to the patient that Tradjenta and Januvia are in the same family and that I do not believe any change is necessary.  I believe this would be superficial but not affect any objective findings.  If his creatinine declines, I believe it would be a coincidence but not related to stopping the Januvia and switching to [place].  However the patient would feel more comfortable taking the Tradjenta.  I am perfectly comfortable with that.  I believe each medicine is perfectly acceptable and if this makes him more comfortable on final change.  He also recently reports  having hot flashes in the middle of the evening.  This usually occurs around 2 or 3 in the morning.  He was taking his fenofibrate with his simvastatin at night.  Since switching his fenofibrate to the morning, the hot flashes have stopped.  While I am fine with him splitting up the doses of his cholesterol medication I do not believe that this was related to the fenofibrate.  Patient would like to discontinue the fenofibrate or switch to a lower dose.  I advised against that because I believe this is is unrelated to the hot flashes at night. Past Medical History:  Diagnosis Date  . Allergy   . Arthritis   . Chronic bronchitis   . Chronic pain    per pt/ right shoulder pain, left leg and ankle  . CKD (chronic kidney disease) stage 3, GFR 30-59 ml/min   . Costochondral chest pain    due to torn tendons.  . CTS (carpal tunnel syndrome)    right hand  . Diabetes mellitus without complication (HCC)    prediabetes/ no meds  . Diverticulitis   . GERD (gastroesophageal reflux disease)   . History of anal fissures   . Hyperlipemia   . Hypertension   . Rectal bleeding    occasional  . Seasonal allergies    Past Surgical History:  Procedure Laterality Date  . ANKLE FRACTURE SURGERY  2005 / 2007  left ankle and  left leg/ have screws in leg and ankle  . EXAMINATION UNDER ANESTHESIA N/A 12/15/2012   Procedure: EXAM UNDER ANESTHESIA;  Surgeon: Atilano Ina, MD;  Location: [facility];  Service: General;  Laterality: N/A;  . FRACTURE SURGERY  2005 and 2007   lt ankle x2  . HEMORRHOIDECTOMY WITH HEMORRHOID BANDING N/A 12/15/2012   Procedure: EXCISIONAL HEMORRHOIDECTOMY WITH HEMORRHOID BANDING;  Surgeon: Atilano Ina, MD;  Location: [facility];  Service: General;  Laterality: N/A;  . rotator cuff surgery      x 2/ right shoulder  . TRIGGER FINGER RELEASE  07/23/2011   Procedure: Right hand- RELEASE TRIGGER FINGER/A-1 PULLEY;  Surgeon: Wyn Forster., MD;   [facility];  Service: Orthopedics;  Laterality: Right;  . WRIST FRACTURE SURGERY  1984   lt with bone graft  . WRIST SURGERY     left wrist   Current Outpatient Medications on File Prior to Visit  Medication Sig Dispense Refill  . APPLE CID VN-GRN TEA-BIT OR-CR PO     . aspirin EC 81 MG tablet Take 81 mg by mouth daily.    . Biotin 10 MG TABS Take by mouth. Biotin 10 mg every other day    . calcium carbonate (OS-CAL) 600 MG TABS Take 600 mg by mouth. Every other day    . carvedilol (COREG) 12.5 MG tablet TAKE 1 TABLET BY MOUTH TWICE DAILY WITH A MEAL 180 tablet 1  . cetirizine (ZYRTEC) 10 MG tablet Take 1 tablet (10 mg total) by mouth daily. 90 tablet 3  . Cinnamon 500 MG capsule Take 500 mg by mouth 3 (three) times daily.     Marland Kitchen EXTRA STRENGTH ACETAMINOPHEN PO     . fenofibrate 160 MG tablet Take 1 tablet (160 mg total) by mouth daily. 90 tablet 3  . fish oil-omega-3 fatty acids 1000 MG capsule Take by mouth daily. Take 2 pills in am and 1 pill at night.    . fluticasone (FLONASE) 50 MCG/ACT nasal spray Place 2 sprays into both nostrils daily. 16 g 5  . Glucosamine 500 MG CAPS Take by mouth. Glucosamine 1000 mg bid    . glucosamine-chondroitin 500-400 MG tablet Take 1 tablet by mouth 3 (three) times daily.    Marland Kitchen L-LYSINE PO Take by mouth daily at 6 (six) AM.    . losartan (COZAAR) 100 MG tablet Take 1 tablet (100 mg total) by mouth daily. 90 tablet 3  . LUTEIN PO Take 1 tablet by mouth daily.    . mometasone (ELOCON) 0.1 % cream Apply 1 application topically daily. 45 g 0  . Multiple Vitamin (MULITIVITAMIN WITH MINERALS) TABS Take 1 tablet by mouth daily.    . NON FORMULARY Vita fusion gummie-Take 5 in am and 5 in evening    . NON FORMULARY Mangostein capsule-Take 3 pills in am and 2 pills in pm.    . omeprazole (PRILOSEC) 20 MG capsule Take 1 capsule (20 mg total) by mouth 2 (two) times daily before a meal. 180 capsule 3  . simvastatin (ZOCOR) 20 MG tablet Take 1  tablet (20 mg total) by mouth every evening. 90 tablet 3  . sitaGLIPtin (JANUVIA) 100 MG tablet Take 1 tablet (100 mg total) by mouth daily. 30 tablet 3   Current Facility-Administered Medications on File Prior to Visit  Medication Dose Route Frequency Provider Last Rate Last Admin  . 0.9 %  sodium chloride infusion  500 mL Intravenous Once Doran Stabler, MD       Allergies  Allergen Reactions  . Amlodipine Other (See Comments)    Per pt - damage to kidney's  . Codeine Nausea And Vomiting  . Ivp Dye [Iodinated Diagnostic Agents]     Nauseated  . Penicillins Nausea And Vomiting    As a child  . Sulfa Antibiotics     nauseated   Social History   Socioeconomic History  . Marital status: Single    Spouse name: Not on file  . Number of children: Not on file  . Years of education: Not on file  . Highest education level: Not on file  Occupational History  . Not on file  Tobacco Use  . Smoking status: Never Smoker  . Smokeless tobacco: Never Used  Substance and Sexual Activity  . Alcohol use: No  . Drug use: No  . Sexual activity: Not on file  Other Topics Concern  . Not on file  Social History Narrative  . Not on file   Social Determinants of Health   Financial Resource Strain:   . Difficulty of Paying Living Expenses:   Food Insecurity:   . Worried About Charity fundraiser in the Last Year:   . Arboriculturist in the Last Year:   Transportation Needs:   . Film/video editor (Medical):   Marland Kitchen Lack of Transportation (Non-Medical):   Physical Activity:   . Days of Exercise per Week:   . Minutes of Exercise per Session:   Stress:   . Feeling of Stress :   Social Connections:   . Frequency of Communication with Friends and Family:   . Frequency of Social Gatherings with Friends and Family:   . Attends Religious Services:   . Active Member of Clubs or Organizations:   . Attends Archivist Meetings:   Marland Kitchen Marital Status:   Intimate Partner Violence:     . Fear of Current or Ex-Partner:   . Emotionally Abused:   Marland Kitchen Physically Abused:   . Sexually Abused:       Review of Systems  All other systems reviewed and are negative.      Objective:   Physical Exam        Assessment & Plan:  Controlled type 2 diabetes mellitus without complication, without long-term current use of insulin (HCC)  Stage 3 chronic kidney disease, unspecified whether stage 3a or 3b CKD  Patient will discontinue Januvia and switch to Tradjenta 5 mg a day as this makes him more comfortable.  I recommended that he continue fenofibrate and simvastatin as I do not believe it is contributing to his hot flashes.  We will recheck his fasting lab work in 3 months including a CMP, hemoglobin A1c, and fasting lipid panel.

## 2019-06-11 ENCOUNTER — Telehealth: Payer: Self-pay | Admitting: Family Medicine

## 2019-06-11 ENCOUNTER — Ambulatory Visit: Payer: 59 | Admitting: Family Medicine

## 2019-06-11 DIAGNOSIS — G5600 Carpal tunnel syndrome, unspecified upper limb: Secondary | ICD-10-CM

## 2019-06-11 NOTE — Telephone Encounter (Signed)
Patient would like referral to for his carpal tunnel to dr Merlyn Lot  If possible

## 2019-07-21 ENCOUNTER — Other Ambulatory Visit: Payer: Self-pay | Admitting: Orthopedic Surgery

## 2019-07-22 ENCOUNTER — Encounter (HOSPITAL_BASED_OUTPATIENT_CLINIC_OR_DEPARTMENT_OTHER): Payer: Self-pay | Admitting: Orthopedic Surgery

## 2019-07-22 ENCOUNTER — Other Ambulatory Visit: Payer: Self-pay

## 2019-07-26 ENCOUNTER — Other Ambulatory Visit (HOSPITAL_COMMUNITY): Payer: 59

## 2019-07-27 ENCOUNTER — Encounter (HOSPITAL_BASED_OUTPATIENT_CLINIC_OR_DEPARTMENT_OTHER)
Admission: RE | Admit: 2019-07-27 | Discharge: 2019-07-27 | Disposition: A | Discharge status: Home / Self Care | Payer: 59 | Source: Ambulatory Visit | Attending: Orthopedic Surgery | Admitting: Orthopedic Surgery

## 2019-07-27 ENCOUNTER — Other Ambulatory Visit (HOSPITAL_COMMUNITY)
Admission: RE | Admit: 2019-07-27 | Discharge: 2019-07-27 | Disposition: A | Discharge status: Home / Self Care | Payer: 59 | Source: Ambulatory Visit | Attending: Orthopedic Surgery | Admitting: Orthopedic Surgery

## 2019-07-27 DIAGNOSIS — N183 Chronic kidney disease, stage 3 unspecified: Secondary | ICD-10-CM | POA: Diagnosis not present

## 2019-07-27 DIAGNOSIS — Z882 Allergy status to sulfonamides status: Secondary | ICD-10-CM | POA: Diagnosis not present

## 2019-07-27 DIAGNOSIS — Z01818 Encounter for other preprocedural examination: Secondary | ICD-10-CM | POA: Diagnosis present

## 2019-07-27 DIAGNOSIS — I10 Essential (primary) hypertension: Secondary | ICD-10-CM | POA: Insufficient documentation

## 2019-07-27 DIAGNOSIS — K219 Gastro-esophageal reflux disease without esophagitis: Secondary | ICD-10-CM | POA: Diagnosis not present

## 2019-07-27 DIAGNOSIS — E785 Hyperlipidemia, unspecified: Secondary | ICD-10-CM | POA: Diagnosis not present

## 2019-07-27 DIAGNOSIS — E1122 Type 2 diabetes mellitus with diabetic chronic kidney disease: Secondary | ICD-10-CM | POA: Diagnosis not present

## 2019-07-27 DIAGNOSIS — Z20822 Contact with and (suspected) exposure to covid-19: Secondary | ICD-10-CM | POA: Diagnosis not present

## 2019-07-27 DIAGNOSIS — M1990 Unspecified osteoarthritis, unspecified site: Secondary | ICD-10-CM | POA: Diagnosis not present

## 2019-07-27 DIAGNOSIS — Z888 Allergy status to other drugs, medicaments and biological substances status: Secondary | ICD-10-CM | POA: Diagnosis not present

## 2019-07-27 DIAGNOSIS — I129 Hypertensive chronic kidney disease with stage 1 through stage 4 chronic kidney disease, or unspecified chronic kidney disease: Secondary | ICD-10-CM | POA: Diagnosis not present

## 2019-07-27 DIAGNOSIS — Z833 Family history of diabetes mellitus: Secondary | ICD-10-CM | POA: Diagnosis not present

## 2019-07-27 DIAGNOSIS — Z880 Allergy status to penicillin: Secondary | ICD-10-CM | POA: Diagnosis not present

## 2019-07-27 DIAGNOSIS — Z8249 Family history of ischemic heart disease and other diseases of the circulatory system: Secondary | ICD-10-CM | POA: Diagnosis not present

## 2019-07-27 DIAGNOSIS — G5603 Carpal tunnel syndrome, bilateral upper limbs: Secondary | ICD-10-CM | POA: Diagnosis present

## 2019-07-27 DIAGNOSIS — Z885 Allergy status to narcotic agent status: Secondary | ICD-10-CM | POA: Diagnosis not present

## 2019-07-27 LAB — BASIC METABOLIC PANEL
Anion gap: 11 (ref 5–15)
BUN: 15 mg/dL (ref 6–20)
CO2: 22 mmol/L (ref 22–32)
Calcium: 9.2 mg/dL (ref 8.9–10.3)
Chloride: 106 mmol/L (ref 98–111)
Creatinine, Ser: 1.32 mg/dL — ABNORMAL HIGH (ref 0.61–1.24)
GFR calc Af Amer: >60 mL/min (ref >60)
GFR calc non Af Amer: 59 mL/min — ABNORMAL LOW (ref >60)
Glucose, Bld: 155 mg/dL — ABNORMAL HIGH (ref 70–99)
Potassium: 4.1 mmol/L (ref 3.5–5.1)
Sodium: 139 mmol/L (ref 135–145)

## 2019-07-27 LAB — SARS CORONAVIRUS 2 (TAT 6-24 HRS): SARS Coronavirus 2: NEGATIVE

## 2019-07-27 NOTE — Progress Notes (Signed)

## 2019-07-29 ENCOUNTER — Encounter (HOSPITAL_BASED_OUTPATIENT_CLINIC_OR_DEPARTMENT_OTHER)
Admission: RE | Disposition: A | Discharge status: Home / Self Care | Payer: Self-pay | Source: Home / Self Care | Attending: Orthopedic Surgery

## 2019-07-29 ENCOUNTER — Ambulatory Visit (HOSPITAL_BASED_OUTPATIENT_CLINIC_OR_DEPARTMENT_OTHER): Payer: 59 | Admitting: Certified Registered Nurse Anesthetist

## 2019-07-29 ENCOUNTER — Ambulatory Visit (HOSPITAL_BASED_OUTPATIENT_CLINIC_OR_DEPARTMENT_OTHER)
Admission: RE | Admit: 2019-07-29 | Discharge: 2019-07-29 | Disposition: A | Discharge status: Home / Self Care | Payer: 59 | Attending: Orthopedic Surgery | Admitting: Orthopedic Surgery

## 2019-07-29 ENCOUNTER — Other Ambulatory Visit: Payer: Self-pay

## 2019-07-29 ENCOUNTER — Encounter (HOSPITAL_BASED_OUTPATIENT_CLINIC_OR_DEPARTMENT_OTHER): Payer: Self-pay | Admitting: Orthopedic Surgery

## 2019-07-29 DIAGNOSIS — Z880 Allergy status to penicillin: Secondary | ICD-10-CM | POA: Insufficient documentation

## 2019-07-29 DIAGNOSIS — Z8249 Family history of ischemic heart disease and other diseases of the circulatory system: Secondary | ICD-10-CM | POA: Insufficient documentation

## 2019-07-29 DIAGNOSIS — G5603 Carpal tunnel syndrome, bilateral upper limbs: Secondary | ICD-10-CM | POA: Diagnosis not present

## 2019-07-29 DIAGNOSIS — I129 Hypertensive chronic kidney disease with stage 1 through stage 4 chronic kidney disease, or unspecified chronic kidney disease: Secondary | ICD-10-CM | POA: Insufficient documentation

## 2019-07-29 DIAGNOSIS — E1122 Type 2 diabetes mellitus with diabetic chronic kidney disease: Secondary | ICD-10-CM | POA: Insufficient documentation

## 2019-07-29 DIAGNOSIS — Z885 Allergy status to narcotic agent status: Secondary | ICD-10-CM | POA: Insufficient documentation

## 2019-07-29 DIAGNOSIS — K219 Gastro-esophageal reflux disease without esophagitis: Secondary | ICD-10-CM | POA: Insufficient documentation

## 2019-07-29 DIAGNOSIS — Z882 Allergy status to sulfonamides status: Secondary | ICD-10-CM | POA: Insufficient documentation

## 2019-07-29 DIAGNOSIS — N183 Chronic kidney disease, stage 3 unspecified: Secondary | ICD-10-CM | POA: Insufficient documentation

## 2019-07-29 DIAGNOSIS — M1990 Unspecified osteoarthritis, unspecified site: Secondary | ICD-10-CM | POA: Insufficient documentation

## 2019-07-29 DIAGNOSIS — Z833 Family history of diabetes mellitus: Secondary | ICD-10-CM | POA: Insufficient documentation

## 2019-07-29 DIAGNOSIS — Z888 Allergy status to other drugs, medicaments and biological substances status: Secondary | ICD-10-CM | POA: Insufficient documentation

## 2019-07-29 DIAGNOSIS — E785 Hyperlipidemia, unspecified: Secondary | ICD-10-CM | POA: Insufficient documentation

## 2019-07-29 HISTORY — PX: CARPAL TUNNEL RELEASE: SHX101

## 2019-07-29 LAB — GLUCOSE, CAPILLARY
Glucose-Capillary: 139 mg/dL — ABNORMAL HIGH (ref 70–99)
Glucose-Capillary: 159 mg/dL — ABNORMAL HIGH (ref 70–99)

## 2019-07-29 SURGERY — CARPAL TUNNEL RELEASE
Anesthesia: General | Site: Wrist | Laterality: Left

## 2019-07-29 MED ORDER — LIDOCAINE 2% (20 MG/ML) 5 ML SYRINGE
INTRAMUSCULAR | Status: AC
  Filled 2019-07-29: qty 5

## 2019-07-29 MED ORDER — LACTATED RINGERS IV SOLN: INTRAVENOUS | Status: DC

## 2019-07-29 MED ORDER — OXYCODONE HCL 5 MG PO TABS: 5.0000 mg | ORAL_TABLET | Freq: Once | ORAL | Status: DC | PRN

## 2019-07-29 MED ORDER — LIDOCAINE HCL (PF) 0.5 % IJ SOLN
INTRAMUSCULAR | Status: DC | PRN
  Administered 2019-07-29: 35 mL via INTRAVENOUS

## 2019-07-29 MED ORDER — IBUPROFEN 100 MG/5ML PO SUSP: 200.0000 mg | Freq: Four times a day (QID) | ORAL | Status: DC | PRN

## 2019-07-29 MED ORDER — IBUPROFEN 200 MG PO TABS: 200.0000 mg | ORAL_TABLET | Freq: Four times a day (QID) | ORAL | Status: DC | PRN

## 2019-07-29 MED ORDER — VANCOMYCIN HCL IN DEXTROSE 1-5 GM/200ML-% IV SOLN
INTRAVENOUS | Status: AC
  Filled 2019-07-29: qty 200

## 2019-07-29 MED ORDER — LIDOCAINE HCL (CARDIAC) PF 100 MG/5ML IV SOSY
PREFILLED_SYRINGE | INTRAVENOUS | Status: DC | PRN
  Administered 2019-07-29: 40 mg via INTRAVENOUS

## 2019-07-29 MED ORDER — OXYCODONE HCL 5 MG/5ML PO SOLN: 5.0000 mg | Freq: Once | ORAL | Status: DC | PRN

## 2019-07-29 MED ORDER — TRAMADOL HCL 50 MG PO TABS: 50.0000 mg | ORAL_TABLET | Freq: Four times a day (QID) | ORAL | 0 refills | Status: DC | PRN

## 2019-07-29 MED ORDER — PROPOFOL 10 MG/ML IV BOLUS
INTRAVENOUS | Status: DC | PRN
  Administered 2019-07-29: 20 mg via INTRAVENOUS

## 2019-07-29 MED ORDER — KETOROLAC TROMETHAMINE 30 MG/ML IJ SOLN: 30.0000 mg | Freq: Once | INTRAMUSCULAR | Status: DC | PRN

## 2019-07-29 MED ORDER — FENTANYL CITRATE (PF) 100 MCG/2ML IJ SOLN: 25.0000 ug | INTRAMUSCULAR | Status: DC | PRN

## 2019-07-29 MED ORDER — MIDAZOLAM HCL 2 MG/2ML IJ SOLN
INTRAMUSCULAR | Status: AC
  Filled 2019-07-29: qty 2

## 2019-07-29 MED ORDER — PROPOFOL 500 MG/50ML IV EMUL
INTRAVENOUS | Status: DC | PRN
  Administered 2019-07-29: 75 ug/kg/min via INTRAVENOUS

## 2019-07-29 MED ORDER — MIDAZOLAM HCL 2 MG/2ML IJ SOLN
INTRAMUSCULAR | Status: DC | PRN
  Administered 2019-07-29: 2 mg via INTRAVENOUS

## 2019-07-29 MED ORDER — VANCOMYCIN HCL 1500 MG/300ML IV SOLN
1500.0000 mg | INTRAVENOUS | Status: AC
  Administered 2019-07-29: 1500 mg via INTRAVENOUS

## 2019-07-29 MED ORDER — FENTANYL CITRATE (PF) 100 MCG/2ML IJ SOLN
INTRAMUSCULAR | Status: AC
  Filled 2019-07-29: qty 2

## 2019-07-29 MED ORDER — MIDAZOLAM HCL 2 MG/2ML IJ SOLN: 1.0000 mg | INTRAMUSCULAR | Status: DC | PRN

## 2019-07-29 MED ORDER — FENTANYL CITRATE (PF) 100 MCG/2ML IJ SOLN
INTRAMUSCULAR | Status: DC | PRN
  Administered 2019-07-29: 100 ug via INTRAVENOUS

## 2019-07-29 MED ORDER — PROPOFOL 10 MG/ML IV BOLUS
INTRAVENOUS | Status: AC
  Filled 2019-07-29: qty 20

## 2019-07-29 MED ORDER — VANCOMYCIN HCL IN DEXTROSE 500-5 MG/100ML-% IV SOLN
INTRAVENOUS | Status: AC
  Filled 2019-07-29: qty 100

## 2019-07-29 MED ORDER — ONDANSETRON HCL 4 MG/2ML IJ SOLN
INTRAMUSCULAR | Status: AC
  Filled 2019-07-29: qty 2

## 2019-07-29 MED ORDER — FENTANYL CITRATE (PF) 100 MCG/2ML IJ SOLN: 50.0000 ug | INTRAMUSCULAR | Status: DC | PRN

## 2019-07-29 MED ORDER — MEPERIDINE HCL 25 MG/ML IJ SOLN: 6.2500 mg | INTRAMUSCULAR | Status: DC | PRN

## 2019-07-29 MED ORDER — BUPIVACAINE HCL (PF) 0.25 % IJ SOLN
INTRAMUSCULAR | Status: DC | PRN
  Administered 2019-07-29: 8 mL

## 2019-07-29 MED ORDER — ONDANSETRON HCL 4 MG/2ML IJ SOLN: 4.0000 mg | Freq: Once | INTRAMUSCULAR | Status: DC | PRN

## 2019-07-29 MED ORDER — ONDANSETRON HCL 4 MG/2ML IJ SOLN
INTRAMUSCULAR | Status: DC | PRN
  Administered 2019-07-29: 4 mg via INTRAVENOUS

## 2019-07-29 SURGICAL SUPPLY — 36 items
APL PRP STRL LF DISP 70% ISPRP (MISCELLANEOUS) ×1
BLADE SURG 15 STRL LF DISP TIS (BLADE) ×1 IMPLANT
BLADE SURG 15 STRL SS (BLADE) ×2
BNDG CMPR 9X4 STRL LF SNTH (GAUZE/BANDAGES/DRESSINGS) ×1
BNDG COHESIVE 3X5 TAN STRL LF (GAUZE/BANDAGES/DRESSINGS) ×2 IMPLANT
BNDG ESMARK 4X9 LF (GAUZE/BANDAGES/DRESSINGS) ×1 IMPLANT
BNDG GAUZE ELAST 4 BULKY (GAUZE/BANDAGES/DRESSINGS) ×2 IMPLANT
CHLORAPREP W/TINT 26 (MISCELLANEOUS) ×2 IMPLANT
CORD BIPOLAR FORCEPS 12FT (ELECTRODE) ×2 IMPLANT
COVER BACK TABLE 60X90IN (DRAPES) ×2 IMPLANT
COVER MAYO STAND STRL (DRAPES) ×2 IMPLANT
COVER WAND RF STERILE (DRAPES) IMPLANT
CUFF TOURN SGL QUICK 18X4 (TOURNIQUET CUFF) ×2 IMPLANT
DRAPE EXTREMITY T 121X128X90 (DISPOSABLE) ×2 IMPLANT
DRAPE SURG 17X23 STRL (DRAPES) ×2 IMPLANT
DRSG PAD ABDOMINAL 8X10 ST (GAUZE/BANDAGES/DRESSINGS) ×2 IMPLANT
GAUZE SPONGE 4X4 12PLY STRL (GAUZE/BANDAGES/DRESSINGS) ×2 IMPLANT
GAUZE XEROFORM 1X8 LF (GAUZE/BANDAGES/DRESSINGS) ×2 IMPLANT
GLOVE BIO SURGEON STRL SZ7 (GLOVE) ×1 IMPLANT
GLOVE BIOGEL PI IND STRL 8.5 (GLOVE) ×1 IMPLANT
GLOVE BIOGEL PI INDICATOR 8.5 (GLOVE) ×1
GLOVE SURG ORTHO 8.0 STRL STRW (GLOVE) ×2 IMPLANT
GOWN STRL REUS W/ TWL LRG LVL3 (GOWN DISPOSABLE) ×1 IMPLANT
GOWN STRL REUS W/TWL LRG LVL3 (GOWN DISPOSABLE) ×4
GOWN STRL REUS W/TWL XL LVL3 (GOWN DISPOSABLE) ×2 IMPLANT
NDL PRECISIONGLIDE 27X1.5 (NEEDLE) IMPLANT
NEEDLE PRECISIONGLIDE 27X1.5 (NEEDLE) ×2 IMPLANT
NS IRRIG 1000ML POUR BTL (IV SOLUTION) ×2 IMPLANT
SET BASIN DAY SURGERY F.S. (CUSTOM PROCEDURE TRAY) ×2 IMPLANT
STOCKINETTE 4X48 STRL (DRAPES) ×2 IMPLANT
SUT ETHILON 4 0 PS 2 18 (SUTURE) ×2 IMPLANT
SUT VICRYL 4-0 PS2 18IN ABS (SUTURE) IMPLANT
SYR BULB EAR ULCER 3OZ GRN STR (SYRINGE) ×2 IMPLANT
SYR CONTROL 10ML LL (SYRINGE) ×1 IMPLANT
TOWEL GREEN STERILE FF (TOWEL DISPOSABLE) ×2 IMPLANT
UNDERPAD 30X36 HEAVY ABSORB (UNDERPADS AND DIAPERS) ×2 IMPLANT

## 2019-07-29 NOTE — Anesthesia Preprocedure Evaluation (Addendum)
Anesthesia Evaluation  Patient identified by MRN, date of birth, ID band Patient awake    Reviewed: Allergy & Precautions, NPO status , Patient's Chart, lab work & pertinent test results  Airway Mallampati: I       Dental no notable dental hx. (+) Teeth Intact   Pulmonary neg pulmonary ROS,    Pulmonary exam normal breath sounds clear to auscultation       Cardiovascular hypertension, Pt. on home beta blockers and Pt. on medications Normal cardiovascular exam Rhythm:Regular Rate:Normal     Neuro/Psych negative psych ROS   GI/Hepatic GERD  Medicated and Controlled,  Endo/Other  diabetes  Renal/GU   negative genitourinary   Musculoskeletal   Abdominal (+) + obese,   Peds  Hematology   Anesthesia Other Findings   Reproductive/Obstetrics                             Anesthesia Physical Anesthesia Plan  ASA: II  Anesthesia Plan: MAC and Regional   Post-op Pain Management:    Induction:   PONV Risk Score and Plan: 2 and Ondansetron and Midazolam  Airway Management Planned: Nasal Cannula, Simple Face Mask and Natural Airway  Additional Equipment: None  Intra-op Plan:   Post-operative Plan:   Informed Consent: I have reviewed the patients History and Physical, chart, labs and discussed the procedure including the risks, benefits and alternatives for the proposed anesthesia with the patient or authorized representative who has indicated his/her understanding and acceptance.       Plan Discussed with: CRNA  Anesthesia Plan Comments:        Anesthesia Quick Evaluation

## 2019-07-29 NOTE — Brief Op Note (Signed)
07/29/2019  9:27 AM  PATIENT:  Sherlie Ban.  59 y.o. male  PRE-OPERATIVE DIAGNOSIS:  LEFT CARPAL SYNDROME  POST-OPERATIVE DIAGNOSIS:  LEFT CARPAL SYNDROME  PROCEDURE:  Procedure(s) with comments: CARPAL TUNNEL RELEASE (Left) - FOREARM BLOCK  SURGEON:  Surgeon(s) and Role:    * Cindee Salt, MD - Primary  PHYSICIAN ASSISTANT:   ASSISTANTS: none   ANESTHESIA:   regional and IV sedation local  EBL: 27ml   BLOOD ADMINISTERED:none  DRAINS: none   LOCAL MEDICATIONS USED:  BUPIVICAINE   SPECIMEN:  No Specimen  DISPOSITION OF SPECIMEN:  N/A  COUNTS:  YES  TOURNIQUET:   Total Tourniquet Time Documented: Forearm (Left) - 24 minutes Total: Forearm (Left) - 24 minutes   DICTATION: .Reubin Milan Dictation  PLAN OF CARE: Discharge to home after PACU  PATIENT DISPOSITION:  PACU - hemodynamically stable.

## 2019-07-29 NOTE — Anesthesia Postprocedure Evaluation (Signed)
Anesthesia Post Note  Patient: Shawn Ball.  Procedure(s) Performed: CARPAL TUNNEL RELEASE (Left Wrist)     Patient location during evaluation: PACU Anesthesia Type: MAC and Regional Level of consciousness: awake Pain management: pain level controlled Vital Signs Assessment: post-procedure vital signs reviewed and stable Respiratory status: spontaneous breathing Cardiovascular status: stable Postop Assessment: no apparent nausea or vomiting Anesthetic complications: no    Last Vitals:  Vitals:   07/29/19 1000 07/29/19 1018  BP: 138/89 132/86  Pulse: 67 68  Resp: 13 18  Temp:  36.6 C  SpO2: 96% 99%    Last Pain:  Vitals:   07/29/19 1018  TempSrc: Oral  PainSc: 0-No pain   Pain Goal: Patients Stated Pain Goal: 3 (07/29/19 0751)                 Huston Foley

## 2019-07-29 NOTE — Op Note (Signed)
NAME: Shawn Ball. MEDICAL RECORD NO: [id number] DATE OF BIRTH: 12-Oct-1960 FACILITY: [facility] LOCATION: [facility] PHYSICIAN: Nicki Reaper, MD   OPERATIVE REPORT   DATE OF PROCEDURE: 07/29/19    PREOPERATIVE DIAGNOSIS:   Carpal tunnel syndrome left hand   POSTOPERATIVE DIAGNOSIS:   Same   PROCEDURE:   Decompression median nerve left hand   SURGEON: Cindee Salt, M.D.   ASSISTANT: none   ANESTHESIA:  Bier block with sedation and Local   INTRAVENOUS FLUIDS:  Per anesthesia flow sheet.   ESTIMATED BLOOD LOSS:  Minimal.   COMPLICATIONS:  None.   SPECIMENS:  none   TOURNIQUET TIME:    Total Tourniquet Time Documented: Forearm (Left) - 24 minutes Total: Forearm (Left) - 24 minutes    DISPOSITION:  Stable to PACU.   INDICATIONS: Patient is a 59 year old male with history of numbness and tingling bilateral hands.  Nerve conductions are positive revealing moderate to severe carpal tunnel syndrome bilaterally.  Not responded to conservative treatment has elected to undergo surgical decompression of the nerve.  Preperi-and postoperative course been discussed along with risk complications.  He is aware that there is no guarantee to the surgery the possibility of infection recurrence injury to arteries nerves tendons complete relief of symptoms and dystrophy.  He is aware that we are attempting to halt the process and allow the nerve the opportunity to improve but cannot guarantee that.  Preoperative area the patient is seen the extremity marked by both patient and surgeon antibiotic given  OPERATIVE COURSE: Patient is brought to the operating room placed in a supine position with the left arm free forearm IV regional anesthetic was carried out without difficulty under the direction the anesthesia department.  He was prepped using ChloraPrep.  A 3-minute dry time was allowed and a timeout taken to confirm patient procedure.  A longitudinal incision was made in the  left palm carried down through subcutaneous tissue.  Bleeders were electrocauterized with bipolar.  The palmar fascia was split.  The superficial palmar arch was identified.  Flexor tendon the ring little finger was identified.  Retractors were placed retracting flexor tendons and median nerve radially ulnar nerve ulnarly.  The flexor retinaculum was then released on its ulnar border.  A right angle and stool retractor placed between skin and forearm fascia.  Deep structures were dissected free with blunt dissection.  The proximal aspect of the flexor retinaculum distal forearm fascia was then released for approximately 2 to 3 cm proximal to the wrist crease under direct vision.  The canal was explored.  A persistent median artery was present.  An hourglass deformity to the nerve was apparent with significant hyperemia.  Motor branch entering the muscle distally.  No further lesions were identified.  The skin was closed erupted 4-0 nylon sutures.  A sterile compressive dressing with the fingers was applied after a local infiltration with quarter percent bupivacaine without epinephrine approximately 8 cc was used.  Patient tolerated procedure well was taken to the recovery room for observation in satisfactory condition.  He will be discharged home [facility] on Tylenol for pain with Ultram which she has taken in the past and tolerated for breakthrough.   Cindee Salt, MD Electronically signed, 07/29/19

## 2019-07-29 NOTE — Discharge Instructions (Signed)

## 2019-07-29 NOTE — H&P (Signed)
Shawn Ball. is an 59 y.o. male.   Chief Complaint:numbness left hand HPI: Shawn Ball is a 59 year old right-hand-dominant male comes in complaining of pain in his right palm numbness and tingling in his fingers. States this began years ago. He had a fall which gave him pain swelling in his hand injury to his shoulder. He had operated on by Dr. Berenice Primas and subsequently rerepaired by Dr. Noemi Chapel. He was told that he might have carpal tunnel syndrome had nerve conductions done by apparently Dr. Domingo Cocking in 2016 and was told that he had carpal tunnel at that time. He saw Dr. Suszanne Conners who did a trigger thumb release on his right side. He states this has helped but he is required to use his left hand and now has symptoms on his left side 2. He complains of numbness and tingling in through ring fingers bilaterally left greater than right there is no particular activity that seems to aggravate it he is wearing splints at night but is frequently awakened with numbness tingling. Has renal failure and takes Tylenol. He has a history of diabetes arthritis no history of thyroid problems or gout. Family history is positive diabetes negative for the remainder. He complains of no specific history history of other injuries to his hand or to his neck with no history of motor vehicular accidents.  He has had his nerve conductions done by Dr. Tamsen Roers revealing carpal tunnel syndrome bilaterally with a motor delay of 5 7 on his left 5 3 in his right a sensory delay of 5 0 on his left and 4 8 on his right.  Past Medical History:  Diagnosis Date  . Allergy   . Arthritis   . Chronic bronchitis   . Chronic pain    per pt/ right shoulder pain, left leg and ankle  . CKD (chronic kidney disease) stage 3, GFR 30-59 ml/min   . Costochondral chest pain    due to torn tendons.  . CTS (carpal tunnel syndrome)    right hand  . Diabetes mellitus without complication ([place])    prediabetes/ no meds  . Diverticulitis   . GERD  (gastroesophageal reflux disease)   . History of anal fissures   . Hyperlipemia   . Hypertension   . Rectal bleeding    occasional  . Seasonal allergies     Past Surgical History:  Procedure Laterality Date  . ANKLE FRACTURE SURGERY  2005 / 2007   left ankle and  left leg/ have screws in leg and ankle  . EXAMINATION UNDER ANESTHESIA N/A 12/15/2012   Procedure: EXAM UNDER ANESTHESIA;  Surgeon: Gayland Curry, MD;  Location: [facility];  Service: General;  Laterality: N/A;  . FRACTURE SURGERY  2005 and 2007   lt ankle x2  . HEMORRHOIDECTOMY WITH HEMORRHOID BANDING N/A 12/15/2012   Procedure: EXCISIONAL HEMORRHOIDECTOMY WITH HEMORRHOID BANDING;  Surgeon: Gayland Curry, MD;  Location: [place];  Service: General;  Laterality: N/A;  . rotator cuff surgery      x 2/ right shoulder  . TRIGGER FINGER RELEASE  07/23/2011   Procedure: Right hand- RELEASE TRIGGER FINGER/A-1 PULLEY;  Surgeon: Cammie Sickle., MD;  Location: [place];  Service: Orthopedics;  Laterality: Right;  . WRIST FRACTURE SURGERY  1984   lt with bone graft  . WRIST SURGERY     left wrist    Family History  Problem Relation Age of Onset  . Heart disease Mother   .  Hyperlipidemia Mother   . Diabetes Mother   . Macular degeneration Mother   . Cancer Father        skin  . Macular degeneration Sister   . Diabetes Brother   . Colon cancer Neg Hx   . Stomach cancer Neg Hx   . Esophageal cancer Neg Hx   . Rectal cancer Neg Hx    Social History:  reports that he has never smoked. He has never used smokeless tobacco. He reports that he does not drink alcohol or use drugs.  Allergies:  Allergies  Allergen Reactions  . Amlodipine Other (See Comments)    Per pt - damage to kidney's  . Codeine Nausea And Vomiting  . Ivp Dye [Iodinated Diagnostic Agents]     Nauseated  . Penicillins Nausea And Vomiting    As a child  . Sulfa Antibiotics     nauseated    No  medications prior to admission.    Results for orders placed or performed during the hospital encounter of 07/29/19 (from the past 48 hour(s))  Basic metabolic panel     Status: Abnormal   Collection Time: 07/27/19 10:17 AM  Result Value Ref Range   Sodium 139 135 - 145 mmol/L   Potassium 4.1 3.5 - 5.1 mmol/L   Chloride 106 98 - 111 mmol/L   CO2 22 22 - 32 mmol/L   Glucose, Bld 155 (H) 70 - 99 mg/dL    Comment: Glucose reference range applies only to samples taken after fasting for at least 8 hours.   BUN 15 6 - 20 mg/dL   Creatinine, Ser 2.19 (H) 0.61 - 1.24 mg/dL   Calcium 9.2 8.9 - 75.8 mg/dL   GFR calc non Af Amer 59 (L) >60 mL/min   GFR calc Af Amer >60 >60 mL/min   Anion gap 11 5 - 15    Comment: Performed at [facility], [address], [place], [place] [address]    No results found.   Pertinent items are noted in HPI.  Height 5\' 11"  (1.803 m), weight 106.1 kg.  General appearance: alert, cooperative and appears stated age Head: Normocephalic, without obvious abnormality Neck: no JVD Resp: clear to auscultation bilaterally Cardio: regular rate and rhythm, S1, S2 normal, no murmur, click, rub or gallop GI: soft, non-tender; bowel sounds normal; no masses,  no organomegaly Extremities: numbness left hand Pulses: 2+ and symmetric Skin: Skin color, texture, turgor normal. No rashes or lesions Neurologic: Grossly normal Incision/Wound: na  Assessment/Plan Assessment:  Bilateral carpal tunnel syndrome    Plan: He would like to proceed to have the left carpal tunnel release. Preperi-and postoperative course are discussed along with risk and complications. He is aware that there is no guarantee to the surgery the possibility of infection recurrence injury to arteries nerves tendons incomplete relief symptoms dystrophy. Advised that we are attempting to halt the process along the nerve to get better but cannot guarantee that it will. He is scheduled for left carpal  tunnel release in outpatient under regional anesthesia.   07/29/2019, 5:26 AM

## 2019-07-29 NOTE — Transfer of Care (Signed)
Immediate Anesthesia Transfer of Care Note  Patient: Shawn Ball.  Procedure(s) Performed: CARPAL TUNNEL RELEASE (Left Wrist)  Patient Location: PACU  Anesthesia Type:Bier block  Level of Consciousness: drowsy  Airway & Oxygen Therapy: Patient Spontanous Breathing and Patient connected to face mask oxygen  Post-op Assessment: Report given to RN and Post -op Vital signs reviewed and stable  Post vital signs: Reviewed and stable  Last Vitals:  Vitals Value Taken Time  BP 153/88 07/29/19 0928  Temp    Pulse 74 07/29/19 0930  Resp 16 07/29/19 0930  SpO2 98 % 07/29/19 0930  Vitals shown include unvalidated device data.  Last Pain:  Vitals:   07/29/19 0751  TempSrc: Oral  PainSc: 0-No pain      Patients Stated Pain Goal: 3 (07/29/19 0751)  Complications: No apparent anesthesia complications

## 2019-08-05 ENCOUNTER — Other Ambulatory Visit: Payer: Self-pay | Admitting: Orthopedic Surgery

## 2019-08-11 ENCOUNTER — Telehealth: Payer: Self-pay | Admitting: Family Medicine

## 2019-08-11 NOTE — Telephone Encounter (Signed)
CB# [phone] Pt need for Dr.Pickard prescribe his One Touch Verio Flex , Lantus  , also the stripes

## 2019-08-12 ENCOUNTER — Other Ambulatory Visit: Payer: Self-pay

## 2019-08-12 ENCOUNTER — Telehealth: Payer: Self-pay

## 2019-08-12 MED ORDER — ONETOUCH ULTRASOFT LANCETS MISC
12 refills | Status: DC
Start: 2019-08-12 — End: 2019-08-13

## 2019-08-12 NOTE — Telephone Encounter (Signed)
refilled 

## 2019-08-13 ENCOUNTER — Telehealth: Payer: Self-pay | Admitting: Family Medicine

## 2019-08-13 MED ORDER — CARVEDILOL 25 MG PO TABS: ORAL_TABLET | ORAL | 0 refills | Status: DC

## 2019-08-13 MED ORDER — ONETOUCH ULTRASOFT LANCETS MISC: 12 refills | Status: DC

## 2019-08-13 NOTE — Telephone Encounter (Signed)
Pt called in his bp has been elevated the past week or so Recent carpal tunnel surgery  no symptoms from elevated BP but  Has been 130's/ 90's diastolic HR 70-90  Taking losartan 100mg  BID Coreg 12.5mg  BID   Will increase Coreg to 25mg  BID Pt has appt on 6/24 with PCP   Noted has CRI and allergy to norvasc

## 2019-08-16 ENCOUNTER — Telehealth: Payer: Self-pay | Admitting: Family Medicine

## 2019-08-16 NOTE — Telephone Encounter (Signed)
Pt called back on Saturday at [facility]  He states that he woke up with some chest discomfort that was mild.  He decided to go check his blood pressure and it was going up was 140 over 90s.  He had taken 25 mg of the metoprolol the night before and he was concerned it was due to the higher dose of medication.  He did admit that he is still a little anxious because of the chest discomfort but then states he has a history of costochondritis and is not sure if it was just that flared up.  He has not had any chest pain while he had been on the phone and his systolic over 40 minutes ago.  He did not have any diaphoresis shortness of breath or radiation with it.  He was due to be leaving to go to the beach in a couple of hours.  Advised patient that he get recurrent chest pain need to be seen urgently because EMS could get the closest hospital.  With regards to the blood pressure medicine he was very concerned that the higher dose caused the symptoms advised that he can take 1-1/2 of the 12.5 mg tablet twice a days he never picked up the new prescription I sent to the pharmacy.  He will also check his BP again after sitting for about 1 hour, if still elevated he can call me back    He can follow up in the office when he comes back from his vacation

## 2019-08-25 ENCOUNTER — Other Ambulatory Visit: Payer: 59

## 2019-08-25 ENCOUNTER — Ambulatory Visit: Payer: 59

## 2019-08-25 ENCOUNTER — Other Ambulatory Visit: Payer: Self-pay

## 2019-08-25 ENCOUNTER — Ambulatory Visit (INDEPENDENT_AMBULATORY_CARE_PROVIDER_SITE_OTHER): Payer: 59 | Admitting: Nurse Practitioner

## 2019-08-25 VITALS — BP 144/100 | HR 53 | Temp 97.6°F | Resp 18 | Wt 231.8 lb

## 2019-08-25 DIAGNOSIS — R0789 Other chest pain: Secondary | ICD-10-CM

## 2019-08-25 DIAGNOSIS — E782 Mixed hyperlipidemia: Secondary | ICD-10-CM

## 2019-08-25 DIAGNOSIS — I10 Essential (primary) hypertension: Secondary | ICD-10-CM | POA: Diagnosis not present

## 2019-08-25 DIAGNOSIS — N183 Chronic kidney disease, stage 3 unspecified: Secondary | ICD-10-CM

## 2019-08-25 DIAGNOSIS — R030 Elevated blood-pressure reading, without diagnosis of hypertension: Secondary | ICD-10-CM

## 2019-08-25 DIAGNOSIS — E119 Type 2 diabetes mellitus without complications: Secondary | ICD-10-CM

## 2019-08-25 MED ORDER — FREESTYLE LANCETS MISC
12 refills | Status: DC
Start: 2019-08-25 — End: 2019-10-28

## 2019-08-25 NOTE — Progress Notes (Signed)
Established Patient Office Visit  Subjective:  Patient ID: Shawn Ball., male    DOB: 05-14-1960  Age: 59 y.o. MRN: [id number]  CC:  Chief Complaint  Patient presents with  . Hypertension    pt is having issues with htn and pulse racing    HPI Shawn Ball. is a 59 year old presenting to clinic with c/o continued elevated blood pressure after last week telephone conversation with providers and change in blood pressure managing medications. Today he is experiencing elevated blood pressure without cp, ct, gu/gi sxs, edema, palpitation, sob, syncope or near, , change of vision, or fatigue. He does spend time discussing his recent hand surgery and feeling some anxiety about possibilities of the other hand surgery.  He reported a recent beach trip and taking one of his family members xanax's and his blood pressure reduced to normal. He denied feeling however that he has been feeling worried. He does report not sleeping well recently waking at night for unknown reasons, denied pain or sxs but also denied worries or anxiety. He reported 2 mornings ago waking with some nausea. He also has felt some ct, cp off and on no other sxs over the course of a few days. We discussed referring to cardiology r/t these sxs and the recent ongoing changes in blood pressure controlling medications with continued elevated blood pressures.   The pt did not take any blood pressure medication this AM and came to the clinic for lab work. He had his blood pressure checked and it was elevated. We discussed that he does have a diagnoses of HTN and without taking his medication that having an elevated blood pressure is not uncommon. Time spent educating the pt on managing HTN and treatment plan as well as red flags of MI and when to seek urgent medical attn.  Past Medical History:  Diagnosis Date  . Allergy   . Arthritis   . Chronic bronchitis   . Chronic pain    per pt/ right shoulder pain, left leg and  ankle  . CKD (chronic kidney disease) stage 3, GFR 30-59 ml/min   . Costochondral chest pain    due to torn tendons.  . CTS (carpal tunnel syndrome)    right hand  . Diabetes mellitus without complication (HCC)    prediabetes/ no meds  . Diverticulitis   . GERD (gastroesophageal reflux disease)   . History of anal fissures   . Hyperlipemia   . Hypertension   . Rectal bleeding    occasional  . Seasonal allergies     Past Surgical History:  Procedure Laterality Date  . ANKLE FRACTURE SURGERY  2005 / 2007   left ankle and  left leg/ have screws in leg and ankle  . CARPAL TUNNEL RELEASE Left 07/29/2019   Procedure: CARPAL TUNNEL RELEASE;  Surgeon: Cindee Salt, MD;  Location: [facility];  Service: Orthopedics;  Laterality: Left;  FOREARM BLOCK  . EXAMINATION UNDER ANESTHESIA N/A 12/15/2012   Procedure: EXAM UNDER ANESTHESIA;  Surgeon: Atilano Ina, MD;  Location: [facility];  Service: General;  Laterality: N/A;  . FRACTURE SURGERY  2005 and 2007   lt ankle x2  . HEMORRHOIDECTOMY WITH HEMORRHOID BANDING N/A 12/15/2012   Procedure: EXCISIONAL HEMORRHOIDECTOMY WITH HEMORRHOID BANDING;  Surgeon: Atilano Ina, MD;  Location: [facility];  Service: General;  Laterality: N/A;  . rotator cuff surgery      x 2/ right shoulder  .  TRIGGER FINGER RELEASE  07/23/2011   Procedure: Right hand- RELEASE TRIGGER FINGER/A-1 PULLEY;  Surgeon: Cammie Sickle., MD;  Location: [facility];  Service: Orthopedics;  Laterality: Right;  . WRIST FRACTURE SURGERY  1984   lt with bone graft  . WRIST SURGERY     left wrist    Family History  Problem Relation Age of Onset  . Heart disease Mother   . Hyperlipidemia Mother   . Diabetes Mother   . Macular degeneration Mother   . Cancer Father        skin  . Macular degeneration Sister   . Diabetes Brother   . Colon cancer Neg Hx   . Stomach cancer Neg Hx   . Esophageal cancer Neg Hx   .  Rectal cancer Neg Hx     Social History   Socioeconomic History  . Marital status: Single    Spouse name: Not on file  . Number of children: Not on file  . Years of education: Not on file  . Highest education level: Not on file  Occupational History  . Not on file  Tobacco Use  . Smoking status: Never Smoker  . Smokeless tobacco: Never Used  Vaping Use  . Vaping Use: Never used  Substance and Sexual Activity  . Alcohol use: No  . Drug use: No  . Sexual activity: Not on file  Other Topics Concern  . Not on file  Social History Narrative  . Not on file   Social Determinants of Health   Financial Resource Strain:   . Difficulty of Paying Living Expenses:   Food Insecurity:   . Worried About Charity fundraiser in the Last Year:   . Arboriculturist in the Last Year:   Transportation Needs:   . Film/video editor (Medical):   Marland Kitchen Lack of Transportation (Non-Medical):   Physical Activity:   . Days of Exercise per Week:   . Minutes of Exercise per Session:   Stress:   . Feeling of Stress :   Social Connections:   . Frequency of Communication with Friends and Family:   . Frequency of Social Gatherings with Friends and Family:   . Attends Religious Services:   . Active Member of Clubs or Organizations:   . Attends Archivist Meetings:   Marland Kitchen Marital Status:   Intimate Partner Violence:   . Fear of Current or Ex-Partner:   . Emotionally Abused:   Marland Kitchen Physically Abused:   . Sexually Abused:     Outpatient Medications Prior to Visit  Medication Sig Dispense Refill  . aspirin EC 81 MG tablet Take 81 mg by mouth daily.    . Bilberry, Vaccinium myrtillus, (BILBERRY EXTRACT PO) Take by mouth.    . Biotin 10 MG TABS Take by mouth. Biotin 10 mg every other day    . carvedilol (COREG) 25 MG tablet TAKE 1 TABLET BY MOUTH TWICE DAILY WITH A MEAL 180 tablet 0  . cetirizine (ZYRTEC) 10 MG tablet Take 1 tablet (10 mg total) by mouth daily. 90 tablet 3  . diphenhydrAMINE  (BENADRYL) 25 mg capsule Take 25 mg by mouth every 6 (six) hours as needed.    . fenofibrate 160 MG tablet Take 1 tablet (160 mg total) by mouth daily. 90 tablet 3  . fish oil-omega-3 fatty acids 1000 MG capsule Take by mouth daily. Take 2 pills in am and 1 pill at night.    . fluticasone (  FLONASE) 50 MCG/ACT nasal spray Place 2 sprays into both nostrils daily. 16 g 5  . glucosamine-chondroitin 500-400 MG tablet Take 1 tablet by mouth 3 (three) times daily.    Marland Kitchen. L-LYSINE PO Take by mouth daily at 6 (six) AM.    . linagliptin (TRADJENTA) 5 MG TABS tablet Take 1 tablet (5 mg total) by mouth daily. 30 tablet 3  . losartan (COZAAR) 100 MG tablet Take 1 tablet (100 mg total) by mouth daily. 90 tablet 3  . LUTEIN PO Take 1 tablet by mouth daily.    . mometasone (ELOCON) 0.1 % cream Apply 1 application topically daily. 45 g 0  . Multiple Vitamin (MULITIVITAMIN WITH MINERALS) TABS Take 1 tablet by mouth daily.    . NON FORMULARY Vita fusion fiber gummie-Take 5 in am and 5 in evening    . NON FORMULARY Mangostein capsule-Take 3 pills in am and 2 pills in pm.    . omeprazole (PRILOSEC) 20 MG capsule Take 1 capsule (20 mg total) by mouth 2 (two) times daily before a meal. 180 capsule 3  . Probiotic Product (PROBIOTIC PO) Take by mouth.    . simvastatin (ZOCOR) 20 MG tablet Take 1 tablet (20 mg total) by mouth every evening. 90 tablet 3  . EXTRA STRENGTH ACETAMINOPHEN PO     . Lancets (ONETOUCH ULTRASOFT) lancets Use as instructed 100 each 12  . traMADol (ULTRAM) 50 MG tablet Take 1 tablet (50 mg total) by mouth every 6 (six) hours as needed. 10 tablet 0   Facility-Administered Medications Prior to Visit  Medication Dose Route Frequency Provider Last Rate Last Admin  . 0.9 %  sodium chloride infusion  500 mL Intravenous Once Sherrilyn Ristanis, Henry L III, MD        Allergies  Allergen Reactions  . Amlodipine Other (See Comments)    Per pt - damage to kidney's  . Codeine Nausea And Vomiting  . Ivp Dye  [Iodinated Diagnostic Agents]     Nauseated  . Penicillins Nausea And Vomiting    As a child  . Sulfa Antibiotics     nauseated    ROS Review of Systems  All other systems reviewed and are negative.     Objective:    Physical Exam Vitals and nursing note reviewed.  Constitutional:      Appearance: Normal appearance. He is well-developed and well-groomed.  HENT:     Head: Normocephalic.     Right Ear: Hearing and external ear normal.     Left Ear: Hearing and external ear normal.     Nose: Nose normal.     Mouth/Throat:     Lips: Pink.  Eyes:     Extraocular Movements: Extraocular movements intact.     Conjunctiva/sclera: Conjunctivae normal.     Pupils: Pupils are equal, round, and reactive to light.  Neck:     Vascular: No JVD.  Cardiovascular:     Rate and Rhythm: Normal rate and regular rhythm.  Pulmonary:     Effort: Pulmonary effort is normal.  Musculoskeletal:        General: Normal range of motion.     Cervical back: Normal range of motion.     Right lower leg: No edema.     Left lower leg: No edema.  Skin:    General: Skin is warm and dry.     Capillary Refill: Capillary refill takes less than 2 seconds.     Coloration: Skin is not jaundiced or pale.  Findings: No bruising.  Neurological:     General: No focal deficit present.     Mental Status: He is alert and oriented to person, place, and time.  Psychiatric:        Attention and Perception: Attention and perception normal.        Mood and Affect: Mood and affect normal.        Speech: Speech normal.        Behavior: Behavior normal. Behavior is cooperative.        Thought Content: Thought content normal.        Cognition and Memory: Cognition normal.        Judgment: Judgment normal.     BP (!) 144/100 (BP Location: Left Arm, Patient Position: Sitting, Cuff Size: Large)   Pulse (!) 53   Temp 97.6 F (36.4 C) (Temporal)   Resp 18   Wt 231 lb 12.8 oz (105.1 kg)   SpO2 99%   BMI 32.33  kg/m  Wt Readings from Last 3 Encounters:  08/25/19 231 lb 12.8 oz (105.1 kg)  07/29/19 232 lb 2.3 oz (105.3 kg)  04/29/19 234 lb (106.1 kg)     Health Maintenance Due  Topic Date Due  . TETANUS/TDAP  Never done  . COVID-19 Vaccine (2 - Moderna 2-dose series) 06/08/2019    There are no preventive care reminders to display for this patient.  No results found for: TSH Lab Results  Component Value Date   WBC 6.3 04/19/2019   HGB 15.1 04/19/2019   HCT 45.4 04/19/2019   MCV 84.1 04/19/2019   PLT 282 04/19/2019   Lab Results  Component Value Date   NA 139 07/27/2019   K 4.1 07/27/2019   CO2 22 07/27/2019   GLUCOSE 155 (H) 07/27/2019   BUN 15 07/27/2019   CREATININE 1.32 (H) 07/27/2019   BILITOT 0.5 04/19/2019   ALKPHOS 48 10/07/2016   AST 19 04/19/2019   ALT 25 04/19/2019   PROT 6.6 04/19/2019   ALBUMIN 4.1 10/07/2016   CALCIUM 9.2 07/27/2019   ANIONGAP 11 07/27/2019   Lab Results  Component Value Date   CHOL 147 04/19/2019   Lab Results  Component Value Date   HDL 26 (L) 04/19/2019   Lab Results  Component Value Date   LDLCALC 96 04/19/2019   Lab Results  Component Value Date   TRIG 146 04/19/2019   Lab Results  Component Value Date   CHOLHDL 5.7 (H) 04/19/2019   Lab Results  Component Value Date   HGBA1C 6.6 (H) 04/19/2019      Assessment & Plan:   Problem List Items Addressed This Visit      Cardiovascular and Mediastinum   Benign essential HTN    Other Visit Diagnoses    Chest tightness    -  Primary   Relevant Orders   EKG 12-Lead (Completed)   Elevated blood pressure reading        EKG normal. Your blood pressure is most likely elevated r/t you not taking your HTN controlling medication this morning yet.   You where administered Bystolic which is a beta blocker in clinic today at 5 mg dose and your blood pressure came down to normal.   Take your Blood pressure medication today as soon as you get home as directed of: Coreg  (carvedilol) 25 mg, Cozaar (Losartan) 100mg . You should take your blood pressure in 3 hours after taking the dosage each day then keep ledger bring to your appts. The  goal blood pressure range is 140/90 or less seeking medical attention when out of this range.   Follow a 2000 mg or less sodium in 24 hour time period diet  Drink plenty of water  Follow a low fat/cholesterol diet  Call 911 or go to the ER for CP, CT, diaphoresis, nausea, vomiting, sudden weakness, feeling that you may faint as discussed today in clinic.      Follow-up: Return in about 2 weeks (around 09/08/2019).    Elmore Guise, FNP

## 2019-08-25 NOTE — Telephone Encounter (Signed)
Please call pt to give these instructions asap:  You where administered Bystolic which is a beta blocker in clinic today at 5 mg dose and your blood pressure came down to normal.   Take your Blood pressure medication today as soon as you get home as directed of: Coreg (carvedilol) 25 mg, Cozaar (Losartan) 100mg . You should take your blood pressure in 3 hours after taking the dosage each day then keep ledger bring to your appts. The goal blood pressure range is 140/90 or less seeking medical attention when out of this range.   Follow a 2000 mg or less sodium in 24 hour time period diet  Drink plenty of water  Follow a low fat/cholesterol diet  Call 911 or go to the ER for CP, CT, diaphoresis, nausea, vomiting, sudden weakness, feeling that you may faint as discussed today in clinic.

## 2019-08-25 NOTE — Telephone Encounter (Signed)
Pt notified Verbalizes understanding 

## 2019-08-26 LAB — COMPLETE METABOLIC PANEL WITH GFR
AG Ratio: 1.8 (calc) (ref 1.0–2.5)
ALT: 30 U/L (ref 9–46)
AST: 19 U/L (ref 10–35)
Albumin: 4.4 g/dL (ref 3.6–5.1)
Alkaline phosphatase (APISO): 53 U/L (ref 35–144)
BUN/Creatinine Ratio: 12 (calc) (ref 6–22)
BUN: 17 mg/dL (ref 7–25)
CO2: 29 mmol/L (ref 20–32)
Calcium: 10 mg/dL (ref 8.6–10.3)
Chloride: 104 mmol/L (ref 98–110)
Creat: 1.44 mg/dL — ABNORMAL HIGH (ref 0.70–1.33)
GFR, Est African American: 62 mL/min/{1.73_m2} (ref >=60)
GFR, Est Non African American: 54 mL/min/{1.73_m2} — ABNORMAL LOW (ref >=60)
Globulin: 2.5 g/dL (calc) (ref 1.9–3.7)
Glucose, Bld: 144 mg/dL — ABNORMAL HIGH (ref 65–99)
Potassium: 4.3 mmol/L (ref 3.5–5.3)
Sodium: 139 mmol/L (ref 135–146)
Total Bilirubin: 0.4 mg/dL (ref 0.2–1.2)
Total Protein: 6.9 g/dL (ref 6.1–8.1)

## 2019-08-26 LAB — HEMOGLOBIN A1C
Hgb A1c MFr Bld: 6.9 %{Hb} — ABNORMAL HIGH
Mean Plasma Glucose: 151 (calc)
eAG (mmol/L): 8.4 (calc)

## 2019-08-26 LAB — LIPID PANEL
Cholesterol: 145 mg/dL
HDL: 30 mg/dL — ABNORMAL LOW
LDL Cholesterol (Calc): 92 mg/dL
Non-HDL Cholesterol (Calc): 115 mg/dL
Total CHOL/HDL Ratio: 4.8 (calc)
Triglycerides: 130 mg/dL

## 2019-08-27 ENCOUNTER — Other Ambulatory Visit: Payer: Self-pay

## 2019-08-27 ENCOUNTER — Ambulatory Visit (INDEPENDENT_AMBULATORY_CARE_PROVIDER_SITE_OTHER): Payer: 59 | Admitting: Family Medicine

## 2019-08-27 VITALS — BP 120/82 | HR 74 | Temp 96.8°F | Ht 71.0 in | Wt 233.0 lb

## 2019-08-27 DIAGNOSIS — E119 Type 2 diabetes mellitus without complications: Secondary | ICD-10-CM

## 2019-08-27 DIAGNOSIS — E782 Mixed hyperlipidemia: Secondary | ICD-10-CM

## 2019-08-27 DIAGNOSIS — R0789 Other chest pain: Secondary | ICD-10-CM

## 2019-08-27 DIAGNOSIS — N183 Chronic kidney disease, stage 3 unspecified: Secondary | ICD-10-CM | POA: Diagnosis not present

## 2019-08-27 DIAGNOSIS — I10 Essential (primary) hypertension: Secondary | ICD-10-CM

## 2019-08-27 MED ORDER — PIOGLITAZONE HCL 15 MG PO TABS: 15.0000 mg | ORAL_TABLET | Freq: Every day | ORAL | 5 refills | Status: DC

## 2019-08-27 NOTE — Progress Notes (Signed)
Subjective:    Patient ID: Shawn Ban., male    DOB: [birth year], 59 y.o.   MRN: [id number]  Medication Refill  Patient seems somewhat upset today.  Apparently he is recently had an issue with his hand.  He had surgery on his carpal tunnel in his left hand and afterwards he developed a trigger finger.  He is convinced that the surgery because the trigger finger in his left hand.  He also recently had a cortisone injection in his left hand and after that he did begin developing chest pain.  I explained to the patient that I do not believe the cortisone injection because the chest pain.  Patient has a difficult time describing the chest pain.  It primarily occurs at night when he is laying down.  There is no exertional component to it.  He reports more of a tightness in his chest with no shortness of breath no radiation of the pain into his arm, no nausea, no vomiting.  He believes his costochondritis.  He denies any symptoms of true angina.  His sister gave him Xanax while he was on vacation at the beach and the pain "improved".  In fact he states that it went away.  He was even able to walk up stairs at the beach without any chest discomfort.  However the chest discomfort has returned now that he discontinue the Xanax.  It is very mild it is a pressure-like sensation in the center of his chest.  He saw my partner who recommended a cardiology consultation.  I am unable to reproduce the pain with palpation today.  Therefore I do not believe is costochondritis.  He denies any cough or pleurisy or hemoptysis.  I believe this is most likely muscular or potentially anxiety related.  He denies any relationship of the pain with food.  He denies any reflux. Lab on 08/25/2019  Component Date Value Ref Range Status   Cholesterol 08/25/2019 145  <200 mg/dL Final   HDL 96/29/5284 30* > OR = 40 mg/dL Final   Triglycerides 13/24/4010 130  <150 mg/dL Final   LDL Cholesterol (Calc) 08/25/2019 92  mg/dL  (calc) Final   Comment: Reference range: <100 . Desirable range <100 mg/dL for primary prevention;   <70 mg/dL for patients with CHD or diabetic patients  with > or = 2 CHD risk factors. Marland Kitchen LDL-C is now calculated using the Martin-Hopkins  calculation, which is a validated novel method providing  better accuracy than the Friedewald equation in the  estimation of LDL-C.  Horald Pollen et al. Lenox Ahr. 2725;366(44): 2061-2068  (http://education.[facility].com/faq/FAQ164)    Total CHOL/HDL Ratio 08/25/2019 4.8  <0.3 (calc) Final   Non-HDL Cholesterol (Calc) 08/25/2019 115  <130 mg/dL (calc) Final   Comment: For patients with diabetes plus 1 major ASCVD risk  factor, treating to a non-HDL-C goal of <100 mg/dL  (LDL-C of <47 mg/dL) is considered a therapeutic  option.    Hgb A1c MFr Bld 08/25/2019 6.9* <5.7 % of total Hgb Final   Comment: For someone without known diabetes, a hemoglobin A1c value of 6.5% or greater indicates that they may have  diabetes and this should be confirmed with a follow-up  test. . For someone with known diabetes, a value <7% indicates  that their diabetes is well controlled and a value  greater than or equal to 7% indicates suboptimal  control. A1c targets should be individualized based on  duration of diabetes, age, comorbid conditions, and  other  considerations. . Currently, no consensus exists regarding use of hemoglobin A1c for diagnosis of diabetes for children. .    Mean Plasma Glucose 08/25/2019 151  (calc) Final   eAG (mmol/L) 08/25/2019 8.4  (calc) Final   Glucose, Bld 08/25/2019 144* 65 - 99 mg/dL Final   Comment: .            Fasting reference interval . For someone without known diabetes, a glucose value >125 mg/dL indicates that they may have diabetes and this should be confirmed with a follow-up test. .    BUN 08/25/2019 17  7 - 25 mg/dL Final   Creat 40/98/119106/23/2021 1.44* 0.70 - 1.33 mg/dL Final   Comment: For patients >49 years  of age, the reference limit for Creatinine is approximately 13% higher for people identified as African-American. .    GFR, Est Non African American 08/25/2019 54* > OR = 60 mL/min/1.3473m2 Final   GFR, Est African American 08/25/2019 62  > OR = 60 mL/min/1.7273m2 Final   BUN/Creatinine Ratio 08/25/2019 12  6 - 22 (calc) Final   Sodium 08/25/2019 139  135 - 146 mmol/L Final   Potassium 08/25/2019 4.3  3.5 - 5.3 mmol/L Final   Chloride 08/25/2019 104  98 - 110 mmol/L Final   CO2 08/25/2019 29  20 - 32 mmol/L Final   Calcium 08/25/2019 10.0  8.6 - 10.3 mg/dL Final   Total Protein 47/82/956206/23/2021 6.9  6.1 - 8.1 g/dL Final   Albumin 13/08/657806/23/2021 4.4  3.6 - 5.1 g/dL Final   Globulin 46/96/295206/23/2021 2.5  1.9 - 3.7 g/dL (calc) Final   AG Ratio 08/25/2019 1.8  1.0 - 2.5 (calc) Final   Total Bilirubin 08/25/2019 0.4  0.2 - 1.2 mg/dL Final   Alkaline phosphatase (APISO) 08/25/2019 53  35 - 144 U/L Final   AST 08/25/2019 19  10 - 35 U/L Final   ALT 08/25/2019 30  9 - 46 U/L Final    Past Medical History:  Diagnosis Date   Allergy    Arthritis    Chronic bronchitis    Chronic pain    per pt/ right shoulder pain, left leg and ankle   CKD (chronic kidney disease) stage 3, GFR 30-59 ml/min    Costochondral chest pain    due to torn tendons.   CTS (carpal tunnel syndrome)    right hand   Diabetes mellitus without complication (HCC)    prediabetes/ no meds   Diverticulitis    GERD (gastroesophageal reflux disease)    History of anal fissures    Hyperlipemia    Hypertension    Rectal bleeding    occasional   Seasonal allergies    Past Surgical History:  Procedure Laterality Date   ANKLE FRACTURE SURGERY  2005 / 2007   left ankle and  left leg/ have screws in leg and ankle   CARPAL TUNNEL RELEASE Left 07/29/2019   Procedure: CARPAL TUNNEL RELEASE;  Surgeon: Cindee SaltKuzma, Gary, MD;  Location: [facility];  Service: Orthopedics;  Laterality: Left;  FOREARM BLOCK     EXAMINATION UNDER ANESTHESIA N/A 12/15/2012   Procedure: EXAM UNDER ANESTHESIA;  Surgeon: Atilano InaEric M Wilson, MD;  Location: [facility];  Service: General;  Laterality: N/A;   FRACTURE SURGERY  2005 and 2007   lt ankle x2   HEMORRHOIDECTOMY WITH HEMORRHOID BANDING N/A 12/15/2012   Procedure: EXCISIONAL HEMORRHOIDECTOMY WITH HEMORRHOID BANDING;  Surgeon: Atilano InaEric M Wilson, MD;  Location: [facility];  Service: General;  Laterality: N/A;   rotator cuff surgery      x 2/ right shoulder   TRIGGER FINGER RELEASE  07/23/2011   Procedure: Right hand- RELEASE TRIGGER FINGER/A-1 PULLEY;  Surgeon: Wyn Forster., MD;  Location: [facility];  Service: Orthopedics;  Laterality: Right;   WRIST FRACTURE SURGERY  1984   lt with bone graft   WRIST SURGERY     left wrist   Current Outpatient Medications on File Prior to Visit  Medication Sig Dispense Refill   aspirin EC 81 MG tablet Take 81 mg by mouth daily.     Bilberry, Vaccinium myrtillus, (BILBERRY EXTRACT PO) Take by mouth.     Biotin 10 MG TABS Take by mouth. Biotin 10 mg every other day     carvedilol (COREG) 25 MG tablet TAKE 1 TABLET BY MOUTH TWICE DAILY WITH A MEAL 180 tablet 0   cetirizine (ZYRTEC) 10 MG tablet Take 1 tablet (10 mg total) by mouth daily. 90 tablet 3   diphenhydrAMINE (BENADRYL) 25 mg capsule Take 25 mg by mouth every 6 (six) hours as needed.     fenofibrate 160 MG tablet Take 1 tablet (160 mg total) by mouth daily. 90 tablet 3   fish oil-omega-3 fatty acids 1000 MG capsule Take by mouth daily. Take 2 pills in am and 1 pill at night.     fluticasone (FLONASE) 50 MCG/ACT nasal spray Place 2 sprays into both nostrils daily. 16 g 5   glucosamine-chondroitin 500-400 MG tablet Take 1 tablet by mouth 3 (three) times daily.     L-LYSINE PO Take by mouth daily at 6 (six) AM.     Lancets (FREESTYLE) lancets Use as instructed 100 each 12   linagliptin (TRADJENTA) 5 MG TABS  tablet Take 1 tablet (5 mg total) by mouth daily. 30 tablet 3   losartan (COZAAR) 100 MG tablet Take 1 tablet (100 mg total) by mouth daily. 90 tablet 3   LUTEIN PO Take 1 tablet by mouth daily.     mometasone (ELOCON) 0.1 % cream Apply 1 application topically daily. 45 g 0   Multiple Vitamin (MULITIVITAMIN WITH MINERALS) TABS Take 1 tablet by mouth daily.     NON FORMULARY Vita fusion fiber gummie-Take 5 in am and 5 in evening     NON FORMULARY Mangostein capsule-Take 3 pills in am and 2 pills in pm.     omeprazole (PRILOSEC) 20 MG capsule Take 1 capsule (20 mg total) by mouth 2 (two) times daily before a meal. 180 capsule 3   Probiotic Product (PROBIOTIC PO) Take by mouth.     simvastatin (ZOCOR) 20 MG tablet Take 1 tablet (20 mg total) by mouth every evening. 90 tablet 3   Current Facility-Administered Medications on File Prior to Visit  Medication Dose Route Frequency Provider Last Rate Last Admin   0.9 %  sodium chloride infusion  500 mL Intravenous Once Danis, Starr Lake III, MD       Allergies  Allergen Reactions   Amlodipine Other (See Comments)    Per pt - damage to kidney's   Codeine Nausea And Vomiting   Ivp Dye [Iodinated Diagnostic Agents]     Nauseated   Penicillins Nausea And Vomiting    As a child   Sulfa Antibiotics     nauseated   Social History   Socioeconomic History   Marital status: Single    Spouse name: Not on file   Number of children: Not on file  Years of education: Not on file   Highest education level: Not on file  Occupational History   Not on file  Tobacco Use   Smoking status: Never Smoker   Smokeless tobacco: Never Used  Vaping Use   Vaping Use: Never used  Substance and Sexual Activity   Alcohol use: No   Drug use: No   Sexual activity: Not on file  Other Topics Concern   Not on file  Social History Narrative   Not on file   Social Determinants of Health   Financial Resource Strain:    Difficulty of  Paying Living Expenses:   Food Insecurity:    Worried About Charity fundraiser in the Last Year:    Arboriculturist in the Last Year:   Transportation Needs:    Film/video editor (Medical):    Lack of Transportation (Non-Medical):   Physical Activity:    Days of Exercise per Week:    Minutes of Exercise per Session:   Stress:    Feeling of Stress :   Social Connections:    Frequency of Communication with Friends and Family:    Frequency of Social Gatherings with Friends and Family:    Attends Religious Services:    Active Member of Clubs or Organizations:    Attends Music therapist:    Marital Status:   Intimate Partner Violence:    Fear of Current or Ex-Partner:    Emotionally Abused:    Physically Abused:    Sexually Abused:    Family History  Problem Relation Age of Onset   Heart disease Mother    Hyperlipidemia Mother    Diabetes Mother    Macular degeneration Mother    Cancer Father        skin   Macular degeneration Sister    Diabetes Brother    Colon cancer Neg Hx    Stomach cancer Neg Hx    Esophageal cancer Neg Hx    Rectal cancer Neg Hx       Review of Systems  All other systems reviewed and are negative.      Objective:   Physical Exam Vitals reviewed.  Constitutional:      General: He is not in acute distress.    Appearance: He is well-developed. He is not diaphoretic.  HENT:     Right Ear: External ear normal.     Left Ear: External ear normal.     Nose: Nose normal.     Mouth/Throat:     Pharynx: No oropharyngeal exudate.  Eyes:     Conjunctiva/sclera: Conjunctivae normal.     Pupils: Pupils are equal, round, and reactive to light.  Neck:     Thyroid: No thyromegaly.     Vascular: No JVD.  Cardiovascular:     Rate and Rhythm: Normal rate and regular rhythm.     Heart sounds: Normal heart sounds. No murmur heard.  No friction rub. No gallop.   Pulmonary:     Effort: Pulmonary effort is  normal. No respiratory distress.     Breath sounds: Normal breath sounds. No wheezing or rales.  Chest:     Chest wall: No tenderness.  Abdominal:     General: Bowel sounds are normal. There is no distension.     Palpations: Abdomen is soft. There is no mass.     Tenderness: There is no abdominal tenderness. There is no guarding or rebound.  Musculoskeletal:  General: Tenderness present. No deformity. Normal range of motion.     Cervical back: Neck supple.  Lymphadenopathy:     Cervical: No cervical adenopathy.  Neurological:     Mental Status: He is alert and oriented to person, place, and time.     Cranial Nerves: No cranial nerve deficit.     Coordination: Coordination normal.     Deep Tendon Reflexes: Reflexes are normal and symmetric.  Psychiatric:        Behavior: Behavior normal.        Thought Content: Thought content normal.        Judgment: Judgment normal.        Controlled type 2 diabetes mellitus without complication, without long-term current use of insulin (HCC)  Benign essential HTN  Stage 3 chronic kidney disease, unspecified whether stage 3a or 3b CKD  Mixed hyperlipidemia  Atypical chest pain  Given the patient's risk factors I do believe he would benefit from seeing a cardiologist for his atypical chest pain given his age, his hypertension, and his diabetes.  However I believe the pain is most likely anxiety related and muscular.  I recommended trying Xanax to see if it helps alleviate the pain again and then focusing on stress reduction.  Patient declines this option.  I asked the patient perform an EKG today given the fact he is feeling tightness in his chest right now.  He refuses this AGAINST MEDICAL ADVICE.  He states that he just saw my partner and had a normal EKG which I did review today and he was having pain at that time.  That EKG is completely normal.  Hemoglobin A1c is slightly elevated at 6.9.  Given his chronic kidney disease I would like  to see it below 6.5.  We discussed options including Actos and glipizide versus Victoza.  Ultimately he elects to try Actos 15 mg a day in addition to his Tradjenta.  Cholesterol is acceptable.  Await the results of the cardiology consultation.

## 2019-09-16 ENCOUNTER — Ambulatory Visit (HOSPITAL_BASED_OUTPATIENT_CLINIC_OR_DEPARTMENT_OTHER): Admission: RE | Admit: 2019-09-16 | Payer: 59 | Source: Home / Self Care | Admitting: Orthopedic Surgery

## 2019-09-16 ENCOUNTER — Encounter (HOSPITAL_BASED_OUTPATIENT_CLINIC_OR_DEPARTMENT_OTHER): Admission: RE | Payer: Self-pay | Source: Home / Self Care

## 2019-09-16 SURGERY — RELEASE, A1 PULLEY, FOR TRIGGER FINGER
Anesthesia: LOCAL | Site: Ring Finger | Laterality: Left

## 2019-09-20 ENCOUNTER — Other Ambulatory Visit: Payer: Self-pay

## 2019-09-20 MED ORDER — CARVEDILOL 25 MG PO TABS: ORAL_TABLET | ORAL | 1 refills | Status: DC

## 2019-09-21 ENCOUNTER — Other Ambulatory Visit: Payer: Self-pay | Admitting: *Deleted

## 2019-09-21 MED ORDER — LINAGLIPTIN 5 MG PO TABS: 5.0000 mg | ORAL_TABLET | Freq: Every day | ORAL | 11 refills | Status: DC

## 2019-09-21 MED ORDER — CARVEDILOL 12.5 MG PO TABS: ORAL_TABLET | ORAL | 3 refills | Status: DC

## 2019-10-25 ENCOUNTER — Other Ambulatory Visit: Payer: 59

## 2019-10-25 ENCOUNTER — Other Ambulatory Visit: Payer: Self-pay

## 2019-10-25 DIAGNOSIS — E119 Type 2 diabetes mellitus without complications: Secondary | ICD-10-CM

## 2019-10-25 DIAGNOSIS — N183 Chronic kidney disease, stage 3 unspecified: Secondary | ICD-10-CM

## 2019-10-25 DIAGNOSIS — E782 Mixed hyperlipidemia: Secondary | ICD-10-CM

## 2019-10-25 DIAGNOSIS — I10 Essential (primary) hypertension: Secondary | ICD-10-CM

## 2019-10-26 LAB — LIPID PANEL
Cholesterol: 164 mg/dL (ref <200)
HDL: 30 mg/dL — ABNORMAL LOW (ref >=40)
LDL Cholesterol (Calc): 111 mg/dL (calc) — ABNORMAL HIGH
Non-HDL Cholesterol (Calc): 134 mg/dL (calc) — ABNORMAL HIGH (ref <130)
Total CHOL/HDL Ratio: 5.5 (calc) — ABNORMAL HIGH (ref <5.0)
Triglycerides: 124 mg/dL (ref <150)

## 2019-10-26 LAB — CBC WITH DIFFERENTIAL/PLATELET
Absolute Monocytes: 392 cells/uL (ref 200–950)
Basophils Absolute: 39 cells/uL (ref 0–200)
Basophils Relative: 0.7 %
Eosinophils Absolute: 263 cells/uL (ref 15–500)
Eosinophils Relative: 4.7 %
HCT: 43.3 % (ref 38.5–50.0)
Hemoglobin: 14.4 g/dL (ref 13.2–17.1)
Lymphs Abs: 1674 cells/uL (ref 850–3900)
MCH: 28.4 pg (ref 27.0–33.0)
MCHC: 33.3 g/dL (ref 32.0–36.0)
MCV: 85.4 fL (ref 80.0–100.0)
MPV: 9.7 fL (ref 7.5–12.5)
Monocytes Relative: 7 %
Neutro Abs: 3231 cells/uL (ref 1500–7800)
Neutrophils Relative %: 57.7 %
Platelets: 246 10*3/uL (ref 140–400)
RBC: 5.07 10*6/uL (ref 4.20–5.80)
RDW: 13.3 % (ref 11.0–15.0)
Total Lymphocyte: 29.9 %
WBC: 5.6 10*3/uL (ref 3.8–10.8)

## 2019-10-26 LAB — COMPLETE METABOLIC PANEL WITH GFR
AG Ratio: 1.7 (calc) (ref 1.0–2.5)
ALT: 22 U/L (ref 9–46)
AST: 18 U/L (ref 10–35)
Albumin: 4 g/dL (ref 3.6–5.1)
Alkaline phosphatase (APISO): 45 U/L (ref 35–144)
BUN/Creatinine Ratio: 11 (calc) (ref 6–22)
BUN: 16 mg/dL (ref 7–25)
CO2: 26 mmol/L (ref 20–32)
Calcium: 9.2 mg/dL (ref 8.6–10.3)
Chloride: 106 mmol/L (ref 98–110)
Creat: 1.41 mg/dL — ABNORMAL HIGH (ref 0.70–1.33)
GFR, Est African American: 64 mL/min/{1.73_m2} (ref >=60)
GFR, Est Non African American: 55 mL/min/{1.73_m2} — ABNORMAL LOW (ref >=60)
Globulin: 2.4 g/dL (calc) (ref 1.9–3.7)
Glucose, Bld: 123 mg/dL — ABNORMAL HIGH (ref 65–99)
Potassium: 4.2 mmol/L (ref 3.5–5.3)
Sodium: 140 mmol/L (ref 135–146)
Total Bilirubin: 0.5 mg/dL (ref 0.2–1.2)
Total Protein: 6.4 g/dL (ref 6.1–8.1)

## 2019-10-26 LAB — HEMOGLOBIN A1C
Hgb A1c MFr Bld: 6.7 % of total Hgb — ABNORMAL HIGH (ref <5.7)
Mean Plasma Glucose: 146 (calc)
eAG (mmol/L): 8.1 (calc)

## 2019-10-28 ENCOUNTER — Ambulatory Visit: Payer: 59 | Admitting: Family Medicine

## 2019-10-28 ENCOUNTER — Other Ambulatory Visit: Payer: Self-pay

## 2019-10-28 VITALS — BP 130/70 | HR 89 | Temp 96.7°F | Ht 71.0 in | Wt 230.0 lb

## 2019-10-28 DIAGNOSIS — N183 Chronic kidney disease, stage 3 unspecified: Secondary | ICD-10-CM | POA: Diagnosis not present

## 2019-10-28 DIAGNOSIS — E119 Type 2 diabetes mellitus without complications: Secondary | ICD-10-CM | POA: Diagnosis not present

## 2019-10-28 DIAGNOSIS — E782 Mixed hyperlipidemia: Secondary | ICD-10-CM | POA: Diagnosis not present

## 2019-10-28 DIAGNOSIS — I10 Essential (primary) hypertension: Secondary | ICD-10-CM

## 2019-10-28 MED ORDER — LINAGLIPTIN 5 MG PO TABS
5.0000 mg | ORAL_TABLET | Freq: Every day | ORAL | 11 refills | Status: DC
Start: 2019-10-28 — End: 2020-03-15

## 2019-10-28 MED ORDER — LOSARTAN POTASSIUM 100 MG PO TABS
100.0000 mg | ORAL_TABLET | Freq: Every day | ORAL | 3 refills | Status: DC
Start: 2019-10-28 — End: 2020-11-07

## 2019-10-28 MED ORDER — PIOGLITAZONE HCL 15 MG PO TABS
15.0000 mg | ORAL_TABLET | Freq: Every day | ORAL | 5 refills | Status: DC
Start: 2019-10-28 — End: 2020-02-07

## 2019-10-28 MED ORDER — GLUCOSAMINE-CHONDROITIN 500-400 MG PO TABS: 1.0000 | ORAL_TABLET | Freq: Three times a day (TID) | ORAL | 3 refills | Status: DC

## 2019-10-28 MED ORDER — FREESTYLE LANCETS MISC: 12 refills | Status: DC

## 2019-10-28 MED ORDER — CETIRIZINE HCL 10 MG PO TABS: 10.0000 mg | ORAL_TABLET | Freq: Every day | ORAL | 3 refills | Status: AC

## 2019-10-28 MED ORDER — OMEPRAZOLE 20 MG PO CPDR: 20.0000 mg | DELAYED_RELEASE_CAPSULE | Freq: Two times a day (BID) | ORAL | 3 refills | Status: DC

## 2019-10-28 MED ORDER — CARVEDILOL 12.5 MG PO TABS: ORAL_TABLET | ORAL | 3 refills | Status: DC

## 2019-10-28 MED ORDER — ROSUVASTATIN CALCIUM 10 MG PO TABS: 10.0000 mg | ORAL_TABLET | Freq: Every day | ORAL | 3 refills | Status: DC

## 2019-10-28 MED ORDER — FENOFIBRATE 160 MG PO TABS
160.0000 mg | ORAL_TABLET | Freq: Every day | ORAL | 3 refills | Status: DC
Start: 2019-10-28 — End: 2020-03-15

## 2019-10-28 NOTE — Progress Notes (Signed)
Subjective:    Patient ID: Shawn Ban., male    DOB: 1960/10/11, 59 y.o.   MRN: [id number]   Patient is a very pleasant 59 year old Caucasian 59 y.o.   MRN: [id number]   Patient is a very pleasant 59 year old Caucasian male here today for a checkup.  After his last visit he started Actos 15 mg a day.  He also cut his simvastatin in half due to nausea that he was experiencing with the medication.  His hemoglobin A1c fell to 6.7.  Nausea did seem to improve on the lower dose of simvastatin however his LDL cholesterol rose to 112.  He denies any chest pain shortness of breath or dyspnea on exertion.  He has had the Covid vaccine.  His most recent lab work is listed below.  Diabetic foot exam was performed today and is normal Lab on 10/25/2019  Component Date Value Ref Range Status  . Hgb A1c MFr Bld 10/25/2019 6.7* <5.7 % of total Hgb Final   Comment: For someone without known diabetes, a hemoglobin A1c value of 6.5% or greater indicates that they may have  diabetes and this should be confirmed with a follow-up  test. . For someone with known diabetes, a value <7% indicates  that their diabetes is well controlled and a value  greater than or equal to 7% indicates suboptimal  control. A1c targets should be individualized based on  duration of diabetes, age, comorbid conditions, and  other considerations. . Currently, no consensus exists regarding use of hemoglobin A1c for diagnosis of diabetes for children. .   . Mean Plasma Glucose 10/25/2019 146  (calc) Final  . eAG (mmol/L) 10/25/2019 8.1  (calc) Final  . Glucose, Bld 10/25/2019 123* 65 - 99 mg/dL Final   Comment: .            Fasting reference interval . For someone without known diabetes, a glucose value between 100 and 125 mg/dL is consistent with prediabetes and should be confirmed with a follow-up test. .   . BUN 10/25/2019 16  7 - 25 mg/dL Final  . Creat 40/10/6759 1.41* 0.70 - 1.33 mg/dL Final   Comment: For patients >69 years of age, the reference limit for Creatinine is  approximately 13% higher for people identified as African-American. .   . GFR, Est Non African American 10/25/2019 55* > OR = 60 mL/min/1.53m2 Final  . GFR, Est African American 10/25/2019 64  > OR = 60 mL/min/1.74m2 Final  . BUN/Creatinine Ratio 10/25/2019 11  6 - 22 (calc) Final  . Sodium 10/25/2019 140  135 - 146 mmol/L Final  . Potassium 10/25/2019 4.2  3.5 - 5.3 mmol/L Final  . Chloride 10/25/2019 106  98 - 110 mmol/L Final  . CO2 10/25/2019 26  20 - 32 mmol/L Final  . Calcium 10/25/2019 9.2  8.6 - 10.3 mg/dL Final  . Total Protein 10/25/2019 6.4  6.1 - 8.1 g/dL Final  . Albumin 95/11/3265 4.0  3.6 - 5.1 g/dL Final  . Globulin 12/45/8099 2.4  1.9 - 3.7 g/dL (calc) Final  . AG Ratio 10/25/2019 1.7  1.0 - 2.5 (calc) Final  . Total Bilirubin 10/25/2019 0.5  0.2 - 1.2 mg/dL Final  . Alkaline phosphatase (APISO) 10/25/2019 45  35 - 144 U/L Final  . AST 10/25/2019 18  10 - 35 U/L Final  . ALT 10/25/2019 22  9 - 46 U/L Final  . Cholesterol 10/25/2019 164  <200 mg/dL Final  . HDL 83/38/2505 30* > OR = 40 mg/dL Final  . Triglycerides 10/25/2019 124  <150 mg/dL Final  .  LDL Cholesterol (Calc) 10/25/2019 111* mg/dL (calc) Final   Comment: Reference range: <100 . Desirable range <100 mg/dL for primary prevention;   <70 mg/dL for patients with CHD or diabetic patients  with > or = 2 CHD risk factors. Marland Kitchen LDL-C is now calculated using the Martin-Hopkins  calculation, which is a validated novel method providing  better accuracy than the Friedewald equation in the  estimation of LDL-C.  Horald Pollen et al. Lenox Ahr. 3532;992(42): 2061-2068  (http://education.[facility].com/faq/FAQ164)   . Total CHOL/HDL Ratio 10/25/2019 5.5* <6.8 (calc) Final  . Non-HDL Cholesterol (Calc) 10/25/2019 134* <130 mg/dL (calc) Final   Comment: For patients with diabetes plus 1 major ASCVD risk  factor, treating to a non-HDL-C goal of <100 mg/dL  (LDL-C of <34 mg/dL) is considered a therapeutic  option.   .  WBC 10/25/2019 5.6  3.8 - 10.8 Thousand/uL Final  . RBC 10/25/2019 5.07  4.20 - 5.80 Million/uL Final  . Hemoglobin 10/25/2019 14.4  13.2 - 17.1 g/dL Final  . HCT 19/62/2297 43.3  38 - 50 % Final  . MCV 10/25/2019 85.4  80.0 - 100.0 fL Final  . MCH 10/25/2019 28.4  27.0 - 33.0 pg Final  . MCHC 10/25/2019 33.3  32.0 - 36.0 g/dL Final  . RDW 98/92/1194 13.3  11.0 - 15.0 % Final  . Platelets 10/25/2019 246  140 - 400 Thousand/uL Final  . MPV 10/25/2019 9.7  7.5 - 12.5 fL Final  . Neutro Abs 10/25/2019 3,231  1,500 - 7,800 cells/uL Final  . Lymphs Abs 10/25/2019 1,674  850 - 3,900 cells/uL Final  . Absolute Monocytes 10/25/2019 392  200 - 950 cells/uL Final  . Eosinophils Absolute 10/25/2019 263  15 - 500 cells/uL Final  . Basophils Absolute 10/25/2019 39  0 - 200 cells/uL Final  . Neutrophils Relative % 10/25/2019 57.7  % Final  . Total Lymphocyte 10/25/2019 29.9  % Final  . Monocytes Relative 10/25/2019 7.0  % Final  . Eosinophils Relative 10/25/2019 4.7  % Final  . Basophils Relative 10/25/2019 0.7  % Final    Past Medical History:  Diagnosis Date  . Allergy   . Arthritis   . Chronic bronchitis   . Chronic pain    per pt/ right shoulder pain, left leg and ankle  . CKD (chronic kidney disease) stage 3, GFR 30-59 ml/min   . Costochondral chest pain    due to torn tendons.  . CTS (carpal tunnel syndrome)    right hand  . Diabetes mellitus without complication (HCC)    prediabetes/ no meds  . Diverticulitis   . GERD (gastroesophageal reflux disease)   . History of anal fissures   . Hyperlipemia   . Hypertension   . Rectal bleeding    occasional  . Seasonal allergies    Past Surgical History:  Procedure Laterality Date  . ANKLE FRACTURE SURGERY  2005 / 2007   left ankle and  left leg/ have screws in leg and ankle  . CARPAL TUNNEL RELEASE Left 07/29/2019   Procedure: CARPAL TUNNEL RELEASE;  Surgeon: Cindee Salt, MD;  Location: [facility];  Service:  Orthopedics;  Laterality: Left;  FOREARM BLOCK  . EXAMINATION UNDER ANESTHESIA N/A 12/15/2012   Procedure: EXAM UNDER ANESTHESIA;  Surgeon: Atilano Ina, MD;  Location: [facility];  Service: General;  Laterality: N/A;  . FRACTURE SURGERY  2005 and 2007   lt ankle x2  . HEMORRHOIDECTOMY WITH HEMORRHOID BANDING N/A 12/15/2012  Procedure: EXCISIONAL HEMORRHOIDECTOMY WITH HEMORRHOID BANDING;  Surgeon: Atilano Ina, MD;  Location: [facility];  Service: General;  Laterality: N/A;  . rotator cuff surgery      x 2/ right shoulder  . TRIGGER FINGER RELEASE  07/23/2011   Procedure: Right hand- RELEASE TRIGGER FINGER/A-1 PULLEY;  Surgeon: Wyn Forster., MD;  Location: [facility];  Service: Orthopedics;  Laterality: Right;  . WRIST FRACTURE SURGERY  1984   lt with bone graft  . WRIST SURGERY     left wrist   Current Outpatient Medications on File Prior to Visit  Medication Sig Dispense Refill  . aspirin EC 81 MG tablet Take 81 mg by mouth daily.    . Bilberry, Vaccinium myrtillus, (BILBERRY EXTRACT PO) Take by mouth.    . Biotin 10 MG TABS Take by mouth. Biotin 10 mg every other day    . carvedilol (COREG) 12.5 MG tablet TAKE 1 TABLET BY MOUTH TWICE DAILY WITH A MEAL 180 tablet 3  . cetirizine (ZYRTEC) 10 MG tablet Take 1 tablet (10 mg total) by mouth daily. 90 tablet 3  . diphenhydrAMINE (BENADRYL) 25 mg capsule Take 25 mg by mouth every 6 (six) hours as needed.    . fenofibrate 160 MG tablet Take 1 tablet (160 mg total) by mouth daily. 90 tablet 3  . fish oil-omega-3 fatty acids 1000 MG capsule Take by mouth daily. Take 2 pills in am and 1 pill at night.    . fluticasone (FLONASE) 50 MCG/ACT nasal spray Place 2 sprays into both nostrils daily. 16 g 5  . glucosamine-chondroitin 500-400 MG tablet Take 1 tablet by mouth 3 (three) times daily.    Marland Kitchen L-LYSINE PO Take by mouth daily at 6 (six) AM.    . Lancets (FREESTYLE) lancets Use as instructed 100  each 12  . linagliptin (TRADJENTA) 5 MG TABS tablet Take 1 tablet (5 mg total) by mouth daily. 30 tablet 11  . losartan (COZAAR) 100 MG tablet Take 1 tablet (100 mg total) by mouth daily. 90 tablet 3  . LUTEIN PO Take 1 tablet by mouth daily.    . mometasone (ELOCON) 0.1 % cream Apply 1 application topically daily. 45 g 0  . Multiple Vitamin (MULITIVITAMIN WITH MINERALS) TABS Take 1 tablet by mouth daily.    . NON FORMULARY Vita fusion fiber gummie-Take 5 in am and 5 in evening    . NON FORMULARY Mangostein capsule-Take 3 pills in am and 2 pills in pm.    . omeprazole (PRILOSEC) 20 MG capsule Take 1 capsule (20 mg total) by mouth 2 (two) times daily before a meal. 180 capsule 3  . pioglitazone (ACTOS) 15 MG tablet Take 1 tablet (15 mg total) by mouth daily. 30 tablet 5  . Probiotic Product (PROBIOTIC PO) Take by mouth.    . simvastatin (ZOCOR) 20 MG tablet Take 1 tablet (20 mg total) by mouth every evening. 90 tablet 3   Current Facility-Administered Medications on File Prior to Visit  Medication Dose Route Frequency Provider Last Rate Last Admin  . 0.9 %  sodium chloride infusion  500 mL Intravenous Once Sherrilyn Rist, MD       Allergies  Allergen Reactions  . Amlodipine Other (See Comments)    Per pt - damage to kidney's  . Codeine Nausea And Vomiting  . Ivp Dye [Iodinated Diagnostic Agents]     Nauseated  . Penicillins Nausea And Vomiting  As a child  . Sulfa Antibiotics     nauseated   Social History   Socioeconomic History  . Marital status: Single    Spouse name: Not on file  . Number of children: Not on file  . Years of education: Not on file  . Highest education level: Not on file  Occupational History  . Not on file  Tobacco Use  . Smoking status: Never Smoker  . Smokeless tobacco: Never Used  Vaping Use  . Vaping Use: Never used  Substance and Sexual Activity  . Alcohol use: No  . Drug use: No  . Sexual activity: Not on file  Other Topics Concern  .  Not on file  Social History Narrative  . Not on file   Social Determinants of Health   Financial Resource Strain:   . Difficulty of Paying Living Expenses: Not on file  Food Insecurity:   . Worried About Programme researcher, broadcasting/film/videounning Out of Food in the Last Year: Not on file  . Ran Out of Food in the Last Year: Not on file  Transportation Needs:   . Lack of Transportation (Medical): Not on file  . Lack of Transportation (Non-Medical): Not on file  Physical Activity:   . Days of Exercise per Week: Not on file  . Minutes of Exercise per Session: Not on file  Stress:   . Feeling of Stress : Not on file  Social Connections:   . Frequency of Communication with Friends and Family: Not on file  . Frequency of Social Gatherings with Friends and Family: Not on file  . Attends Religious Services: Not on file  . Active Member of Clubs or Organizations: Not on file  . Attends BankerClub or Organization Meetings: Not on file  . Marital Status: Not on file  Intimate Partner Violence:   . Fear of Current or Ex-Partner: Not on file  . Emotionally Abused: Not on file  . Physically Abused: Not on file  . Sexually Abused: Not on file   Family History  Problem Relation Age of Onset  . Heart disease Mother   . Hyperlipidemia Mother   . Diabetes Mother   . Macular degeneration Mother   . Cancer Father        skin  . Macular degeneration Sister   . Diabetes Brother   . Colon cancer Neg Hx   . Stomach cancer Neg Hx   . Esophageal cancer Neg Hx   . Rectal cancer Neg Hx       Review of Systems  All other systems reviewed and are negative.      Objective:   Physical Exam Vitals reviewed.  Constitutional:      General: He is not in acute distress.    Appearance: He is well-developed. He is not diaphoretic.  HENT:     Right Ear: External ear normal.     Left Ear: External ear normal.     Nose: Nose normal.     Mouth/Throat:     Pharynx: No oropharyngeal exudate.  Eyes:     Conjunctiva/sclera: Conjunctivae  normal.     Pupils: Pupils are equal, round, and reactive to light.  Neck:     Thyroid: No thyromegaly.     Vascular: No JVD.  Cardiovascular:     Rate and Rhythm: Normal rate and regular rhythm.     Heart sounds: Normal heart sounds. No murmur heard.  No friction rub. No gallop.   Pulmonary:     Effort: Pulmonary effort  is normal. No respiratory distress.     Breath sounds: Normal breath sounds. No wheezing or rales.  Chest:     Chest wall: No tenderness.  Abdominal:     General: Bowel sounds are normal. There is no distension.     Palpations: Abdomen is soft. There is no mass.     Tenderness: There is no abdominal tenderness. There is no guarding or rebound.  Musculoskeletal:        General: Tenderness present. No deformity. Normal range of motion.     Cervical back: Neck supple.  Lymphadenopathy:     Cervical: No cervical adenopathy.  Neurological:     Mental Status: He is alert and oriented to person, place, and time.     Cranial Nerves: No cranial nerve deficit.     Coordination: Coordination normal.     Deep Tendon Reflexes: Reflexes are normal and symmetric.  Psychiatric:        Behavior: Behavior normal.        Thought Content: Thought content normal.        Judgment: Judgment normal.          Controlled type 2 diabetes mellitus without complication, without long-term current use of insulin (HCC)  Benign essential HTN  Stage 3 chronic kidney disease, unspecified whether stage 3a or 3b CKD  Mixed hyperlipidemia  Overall I am pleased with his A1c.  His goal hemoglobin A1c is less than 7.  His renal function has remained stable.  I am happy to see that as well.  We will continue Tradjenta and Actos at their current dosages.  Given his low HDL cholesterol, I would really like to see his LDL cholesterol less than 174.  We will discontinue simvastatin and try Crestor 10 mg a day and recheck in 3 months.  Blood pressure today is acceptable.  The remainder of his  immunizations are up-to-date

## 2019-11-02 ENCOUNTER — Telehealth: Payer: Self-pay | Admitting: Family Medicine

## 2019-11-02 NOTE — Telephone Encounter (Signed)
CB# [phone] Pt was prescribe medication for cholesterol having side effects diarrhea would like to change medication to   Lipitor

## 2019-11-03 NOTE — Telephone Encounter (Addendum)
Call placed to patient and patient made aware.   Per patient, Crestor is causing nausea and multiple episodes of diarrhea. States that he did take Simvastatin (1/2) tab last night and Sx were resolved.   Requested to change to Lipitor.   Also reports that he is currently taking Fenofibrate 160mg , but he is cutting tablet in half.   MD please advise.

## 2019-11-03 NOTE — Telephone Encounter (Signed)
Patient called back states that he would like generic lipitor called into [facility] at [employer]. He states that he had to take his simvastin last night but that was discontinued at his last visit. He states that the crestor upsets his stomach and not working for him.  Patient is requesting Dr. Jeanice Lim to look at this since Dr. Tanya Nones isn't in the office today.   I am going to follow up with patient to advise either way.  CB# [phone]

## 2019-11-03 NOTE — Telephone Encounter (Signed)
Dr. Tanya Nones can handle this tomorrow. He can hold off and not take any cholesterol medication for 1 day, until he sees what will be best for him

## 2019-11-04 MED ORDER — ATORVASTATIN CALCIUM 20 MG PO TABS: 20.0000 mg | ORAL_TABLET | Freq: Every day | ORAL | 3 refills | Status: DC

## 2019-11-04 NOTE — Telephone Encounter (Signed)
Ok to change crestor to lipitor 20 mg a day.

## 2019-11-04 NOTE — Telephone Encounter (Signed)
Call placed to patient and patient made aware.   Prescription sent to pharmacy.  

## 2019-11-05 ENCOUNTER — Telehealth: Payer: Self-pay

## 2019-11-05 NOTE — Telephone Encounter (Signed)
Shawn Ball said the Crestor isn't working out for him. He's not able to sleep well, and could he try Lipitor? Also asked did he need to come in and have his Psa test?

## 2019-12-23 ENCOUNTER — Other Ambulatory Visit: Payer: Self-pay | Admitting: Family Medicine

## 2019-12-29 ENCOUNTER — Other Ambulatory Visit: Payer: Self-pay

## 2019-12-29 ENCOUNTER — Ambulatory Visit: Payer: 59

## 2019-12-29 ENCOUNTER — Ambulatory Visit (INDEPENDENT_AMBULATORY_CARE_PROVIDER_SITE_OTHER): Payer: 59

## 2019-12-29 DIAGNOSIS — Z23 Encounter for immunization: Secondary | ICD-10-CM | POA: Diagnosis not present

## 2019-12-30 ENCOUNTER — Other Ambulatory Visit: Payer: 59

## 2019-12-30 DIAGNOSIS — N183 Chronic kidney disease, stage 3 unspecified: Secondary | ICD-10-CM

## 2019-12-30 DIAGNOSIS — I10 Essential (primary) hypertension: Secondary | ICD-10-CM

## 2019-12-30 DIAGNOSIS — E119 Type 2 diabetes mellitus without complications: Secondary | ICD-10-CM

## 2019-12-31 ENCOUNTER — Telehealth: Payer: Self-pay

## 2019-12-31 LAB — CBC WITH DIFFERENTIAL/PLATELET
Absolute Monocytes: 704 cells/uL (ref 200–950)
Basophils Absolute: 48 cells/uL (ref 0–200)
Basophils Relative: 0.6 %
Eosinophils Absolute: 672 cells/uL — ABNORMAL HIGH (ref 15–500)
Eosinophils Relative: 8.4 %
HCT: 43.6 % (ref 38.5–50.0)
Hemoglobin: 14.2 g/dL (ref 13.2–17.1)
Lymphs Abs: 1712 cells/uL (ref 850–3900)
MCH: 28 pg (ref 27.0–33.0)
MCHC: 32.6 g/dL (ref 32.0–36.0)
MCV: 86 fL (ref 80.0–100.0)
MPV: 9.7 fL (ref 7.5–12.5)
Monocytes Relative: 8.8 %
Neutro Abs: 4864 cells/uL (ref 1500–7800)
Neutrophils Relative %: 60.8 %
Platelets: 242 10*3/uL (ref 140–400)
RBC: 5.07 10*6/uL (ref 4.20–5.80)
RDW: 13.1 % (ref 11.0–15.0)
Total Lymphocyte: 21.4 %
WBC: 8 10*3/uL (ref 3.8–10.8)

## 2019-12-31 LAB — COMPLETE METABOLIC PANEL WITH GFR
AG Ratio: 1.7 (calc) (ref 1.0–2.5)
ALT: 22 U/L (ref 9–46)
AST: 21 U/L (ref 10–35)
Albumin: 4.1 g/dL (ref 3.6–5.1)
Alkaline phosphatase (APISO): 53 U/L (ref 35–144)
BUN/Creatinine Ratio: 14 (calc) (ref 6–22)
BUN: 23 mg/dL (ref 7–25)
CO2: 24 mmol/L (ref 20–32)
Calcium: 9.6 mg/dL (ref 8.6–10.3)
Chloride: 105 mmol/L (ref 98–110)
Creat: 1.61 mg/dL — ABNORMAL HIGH (ref 0.70–1.33)
GFR, Est African American: 54 mL/min/{1.73_m2} — ABNORMAL LOW (ref >=60)
GFR, Est Non African American: 47 mL/min/{1.73_m2} — ABNORMAL LOW (ref >=60)
Globulin: 2.4 g/dL (calc) (ref 1.9–3.7)
Glucose, Bld: 120 mg/dL — ABNORMAL HIGH (ref 65–99)
Potassium: 4.1 mmol/L (ref 3.5–5.3)
Sodium: 139 mmol/L (ref 135–146)
Total Bilirubin: 0.6 mg/dL (ref 0.2–1.2)
Total Protein: 6.5 g/dL (ref 6.1–8.1)

## 2019-12-31 LAB — LIPID PANEL
Cholesterol: 125 mg/dL (ref <200)
HDL: 30 mg/dL — ABNORMAL LOW (ref >=40)
LDL Cholesterol (Calc): 76 mg/dL (calc)
Non-HDL Cholesterol (Calc): 95 mg/dL (calc) (ref <130)
Total CHOL/HDL Ratio: 4.2 (calc) (ref <5.0)
Triglycerides: 108 mg/dL (ref <150)

## 2019-12-31 LAB — HEMOGLOBIN A1C
Hgb A1c MFr Bld: 6.5 % of total Hgb — ABNORMAL HIGH (ref <5.7)
Mean Plasma Glucose: 140 (calc)
eAG (mmol/L): 7.7 (calc)

## 2019-12-31 NOTE — Telephone Encounter (Signed)
Shawn Ball is having issues with urination, and insomnia, along with his stomach. He's asking to be taken off his atorvastatin !

## 2019-12-31 NOTE — Telephone Encounter (Signed)
Ok to stop, but I do not think atorvastatin is causing those issues.

## 2020-01-03 NOTE — Telephone Encounter (Signed)
Sent message thru MyChart 

## 2020-01-27 ENCOUNTER — Telehealth: Payer: Self-pay | Admitting: Family Medicine

## 2020-01-27 NOTE — Telephone Encounter (Signed)
Pt called THursday at 4AM  states he was worried about his blood pressure  he hasnt been sleeping well  His bp was 135/90, and he took and extra 25mg  of Coreg at 2am because he was worried, and check it again and it went up 140/90  No chest pain, no SOB  he went on many tangents about not being able to sleep and coming off cholesterol med, he had surgery recently   Asked about sleeping meds, since he states for a month he wakes up at 2am and checks his bp, states he takes benadryl 1.5 tabs but still doesn't sleep  also denies being anxious   Advised not to take any more BP med and that bp was not dangerously high,   he was very worked up about the midly elevated bp, which I think was from anxiety and insomnia   He called back around 9am, states bp was the same 135-140/90, advised to take his regulary scheduled meds and not to increase otherwise  He needs to f/u with PCP - to discuss anxiety/sleep  He also request cardiology referral for his blood pressure   Will send to PCP To determine appointment

## 2020-01-31 NOTE — Telephone Encounter (Signed)
Pt has lab appt on 02-01-20, and OV appt on 02-07-20

## 2020-01-31 NOTE — Telephone Encounter (Signed)
Please schedule appointment to discuss insomnia and anxiety and blood pressure.

## 2020-02-01 ENCOUNTER — Other Ambulatory Visit: Payer: Self-pay

## 2020-02-01 ENCOUNTER — Other Ambulatory Visit: Payer: 59

## 2020-02-01 DIAGNOSIS — E782 Mixed hyperlipidemia: Secondary | ICD-10-CM

## 2020-02-01 DIAGNOSIS — I10 Essential (primary) hypertension: Secondary | ICD-10-CM

## 2020-02-01 DIAGNOSIS — Z789 Other specified health status: Secondary | ICD-10-CM

## 2020-02-01 DIAGNOSIS — N183 Chronic kidney disease, stage 3 unspecified: Secondary | ICD-10-CM

## 2020-02-02 LAB — LIPID PANEL
Cholesterol: 196 mg/dL (ref <200)
HDL: 28 mg/dL — ABNORMAL LOW (ref >=40)
LDL Cholesterol (Calc): 132 mg/dL (calc) — ABNORMAL HIGH
Non-HDL Cholesterol (Calc): 168 mg/dL (calc) — ABNORMAL HIGH (ref <130)
Total CHOL/HDL Ratio: 7 (calc) — ABNORMAL HIGH (ref <5.0)
Triglycerides: 215 mg/dL — ABNORMAL HIGH (ref <150)

## 2020-02-02 LAB — COMPLETE METABOLIC PANEL WITH GFR
AG Ratio: 1.5 (calc) (ref 1.0–2.5)
ALT: 26 U/L (ref 9–46)
AST: 21 U/L (ref 10–35)
Albumin: 4 g/dL (ref 3.6–5.1)
Alkaline phosphatase (APISO): 46 U/L (ref 35–144)
BUN/Creatinine Ratio: 9 (calc) (ref 6–22)
BUN: 14 mg/dL (ref 7–25)
CO2: 25 mmol/L (ref 20–32)
Calcium: 9.5 mg/dL (ref 8.6–10.3)
Chloride: 105 mmol/L (ref 98–110)
Creat: 1.54 mg/dL — ABNORMAL HIGH (ref 0.70–1.33)
GFR, Est African American: 57 mL/min/{1.73_m2} — ABNORMAL LOW (ref >=60)
GFR, Est Non African American: 49 mL/min/{1.73_m2} — ABNORMAL LOW (ref >=60)
Globulin: 2.6 g/dL (calc) (ref 1.9–3.7)
Glucose, Bld: 117 mg/dL — ABNORMAL HIGH (ref 65–99)
Potassium: 4.3 mmol/L (ref 3.5–5.3)
Sodium: 140 mmol/L (ref 135–146)
Total Bilirubin: 0.5 mg/dL (ref 0.2–1.2)
Total Protein: 6.6 g/dL (ref 6.1–8.1)

## 2020-02-02 LAB — CBC WITH DIFFERENTIAL/PLATELET
Absolute Monocytes: 473 cells/uL (ref 200–950)
Basophils Absolute: 39 cells/uL (ref 0–200)
Basophils Relative: 0.7 %
Eosinophils Absolute: 220 cells/uL (ref 15–500)
Eosinophils Relative: 4 %
HCT: 44.8 % (ref 38.5–50.0)
Hemoglobin: 15 g/dL (ref 13.2–17.1)
Lymphs Abs: 1755 cells/uL (ref 850–3900)
MCH: 28.1 pg (ref 27.0–33.0)
MCHC: 33.5 g/dL (ref 32.0–36.0)
MCV: 83.9 fL (ref 80.0–100.0)
MPV: 9.4 fL (ref 7.5–12.5)
Monocytes Relative: 8.6 %
Neutro Abs: 3014 cells/uL (ref 1500–7800)
Neutrophils Relative %: 54.8 %
Platelets: 289 10*3/uL (ref 140–400)
RBC: 5.34 10*6/uL (ref 4.20–5.80)
RDW: 13 % (ref 11.0–15.0)
Total Lymphocyte: 31.9 %
WBC: 5.5 10*3/uL (ref 3.8–10.8)

## 2020-02-07 ENCOUNTER — Ambulatory Visit: Payer: 59 | Admitting: Family Medicine

## 2020-02-07 ENCOUNTER — Other Ambulatory Visit: Payer: Self-pay

## 2020-02-07 ENCOUNTER — Telehealth: Payer: Self-pay | Admitting: *Deleted

## 2020-02-07 VITALS — BP 148/90 | HR 81 | Temp 97.6°F | Resp 16 | Ht 71.0 in | Wt 239.0 lb

## 2020-02-07 DIAGNOSIS — I10 Essential (primary) hypertension: Secondary | ICD-10-CM

## 2020-02-07 DIAGNOSIS — E119 Type 2 diabetes mellitus without complications: Secondary | ICD-10-CM

## 2020-02-07 DIAGNOSIS — N183 Chronic kidney disease, stage 3 unspecified: Secondary | ICD-10-CM

## 2020-02-07 DIAGNOSIS — R0789 Other chest pain: Secondary | ICD-10-CM

## 2020-02-07 DIAGNOSIS — E782 Mixed hyperlipidemia: Secondary | ICD-10-CM

## 2020-02-07 MED ORDER — EMPAGLIFLOZIN 25 MG PO TABS: 25.0000 mg | ORAL_TABLET | Freq: Every day | ORAL | 3 refills | Status: DC

## 2020-02-07 MED ORDER — DAPAGLIFLOZIN PROPANEDIOL 10 MG PO TABS: 10.0000 mg | ORAL_TABLET | Freq: Every day | ORAL | 5 refills | Status: DC

## 2020-02-07 NOTE — Telephone Encounter (Signed)
Patient contacted office in regards to medication.   Reports that Shawn Ball is not covered.   Call placed to pharmacy. Was advised that insurance prefers [facility].   PCP made aware and NO given to start Jardiance 25mg  in place of Actos.   Prescription sent to pharmacy.   Call placed to patient and patient made aware per VM.

## 2020-02-07 NOTE — Progress Notes (Signed)
Subjective:    Patient ID: Sherlie Ban., male    DOB: [birth year], 59 y.o.   MRN: [id number]   Patient is a 59 year old patient with a history of intolerance to statins who presents today because his blood pressures have been elevated.  At home his blood pressures are running in the 120-140's/80-90's.  He is on carvedilol as well as max dose losartan.  He is also on pioglitazone as well as Tradjenta for his diabetes.  However he has stage III chronic kidney disease with a creatinine that hovers between 1.4 and 1.6.  He has refused to take other statins due to his reactions to Lipitor, Crestor, and simvastatin.  His side effects were not myalgias but were stomach upset and diarrhea and flushing.  He also attributes the statins to his rising creatinine and a slight rise in his eosinophils.  I have tried to reassure him that I do not feel that this is causing that however he believes that the statins cause him to have an allergic reaction and are damaging his kidneys.  He is also having occasional chest pains that are nonexertional.  They typically occur at night or when he is sedentary.  He denies any dyspnea on exertion.  He has not had a stress test. Lab on 02/01/2020  Component Date Value Ref Range Status  . WBC 02/01/2020 5.5  3.8 - 10.8 Thousand/uL Final  . RBC 02/01/2020 5.34  4.20 - 5.80 Million/uL Final  . Hemoglobin 02/01/2020 15.0  13.2 - 17.1 g/dL Final  . HCT 19/62/2297 44.8  38 - 50 % Final  . MCV 02/01/2020 83.9  80.0 - 100.0 fL Final  . MCH 02/01/2020 28.1  27.0 - 33.0 pg Final  . MCHC 02/01/2020 33.5  32.0 - 36.0 g/dL Final  . RDW 98/92/1194 13.0  11.0 - 15.0 % Final  . Platelets 02/01/2020 289  140 - 400 Thousand/uL Final  . MPV 02/01/2020 9.4  7.5 - 12.5 fL Final  . Neutro Abs 02/01/2020 3,014  1,500 - 7,800 cells/uL Final  . Lymphs Abs 02/01/2020 1,755  850 - 3,900 cells/uL Final  . Absolute Monocytes 02/01/2020 473  200 - 950 cells/uL Final  . Eosinophils Absolute  02/01/2020 220  15.0 - 500.0 cells/uL Final  . Basophils Absolute 02/01/2020 39  0.0 - 200.0 cells/uL Final  . Neutrophils Relative % 02/01/2020 54.8  % Final  . Total Lymphocyte 02/01/2020 31.9  % Final  . Monocytes Relative 02/01/2020 8.6  % Final  . Eosinophils Relative 02/01/2020 4.0  % Final  . Basophils Relative 02/01/2020 0.7  % Final  . Glucose, Bld 02/01/2020 117* 65 - 99 mg/dL Final   Comment: .            Fasting reference interval . For someone without known diabetes, a glucose value between 100 and 125 mg/dL is consistent with prediabetes and should be confirmed with a follow-up test. .   . BUN 02/01/2020 14  7 - 25 mg/dL Final  . Creat 17/40/8144 1.54* 0.70 - 1.33 mg/dL Final   Comment: For patients >51 years of age, the reference limit for Creatinine is approximately 13% higher for people identified as African-American. .   . GFR, Est Non African American 02/01/2020 49* > OR = 60 mL/min/1.7m2 Final  . GFR, Est African American 02/01/2020 57* > OR = 60 mL/min/1.59m2 Final  . BUN/Creatinine Ratio 02/01/2020 9  6 - 22 (calc) Final  . Sodium 02/01/2020 140  135 -  146 mmol/L Final  . Potassium 02/01/2020 4.3  3.5 - 5.3 mmol/L Final  . Chloride 02/01/2020 105  98 - 110 mmol/L Final  . CO2 02/01/2020 25  20 - 32 mmol/L Final  . Calcium 02/01/2020 9.5  8.6 - 10.3 mg/dL Final  . Total Protein 02/01/2020 6.6  6.1 - 8.1 g/dL Final  . Albumin 16/12/9602 4.0  3.6 - 5.1 g/dL Final  . Globulin 54/11/8117 2.6  1.9 - 3.7 g/dL (calc) Final  . AG Ratio 02/01/2020 1.5  1.0 - 2.5 (calc) Final  . Total Bilirubin 02/01/2020 0.5  0.2 - 1.2 mg/dL Final  . Alkaline phosphatase (APISO) 02/01/2020 46  35 - 144 U/L Final  . AST 02/01/2020 21  10 - 35 U/L Final  . ALT 02/01/2020 26  9 - 46 U/L Final  . Cholesterol 02/01/2020 196  <200 mg/dL Final  . HDL 14/78/2956 28* > OR = 40 mg/dL Final  . Triglycerides 02/01/2020 215* <150 mg/dL Final   Comment: . If a non-fasting specimen was  collected, consider repeat triglyceride testing on a fasting specimen if clinically indicated.  Perry Mount et al. J. of Clin. Lipidol. 2015;9:129-169. .   . LDL Cholesterol (Calc) 02/01/2020 132* mg/dL (calc) Final   Comment: Reference range: <100 . Desirable range <100 mg/dL for primary prevention;   <70 mg/dL for patients with CHD or diabetic patients  with > or = 2 CHD risk factors. Marland Kitchen LDL-C is now calculated using the Martin-Hopkins  calculation, which is a validated novel method providing  better accuracy than the Friedewald equation in the  estimation of LDL-C.  Horald Pollen et al. Lenox Ahr. 2130;865(78): 2061-2068  (http://education.[facility].com/faq/FAQ164)   . Total CHOL/HDL Ratio 02/01/2020 7.0* <4.6 (calc) Final  . Non-HDL Cholesterol (Calc) 02/01/2020 168* <130 mg/dL (calc) Final   Comment: For patients with diabetes plus 1 major ASCVD risk  factor, treating to a non-HDL-C goal of <100 mg/dL  (LDL-C of <96 mg/dL) is considered a therapeutic  option.     Past Medical History:  Diagnosis Date  . Allergy   . Arthritis   . Chronic bronchitis   . Chronic pain    per pt/ right shoulder pain, left leg and ankle  . CKD (chronic kidney disease) stage 3, GFR 30-59 ml/min   . Costochondral chest pain    due to torn tendons.  . CTS (carpal tunnel syndrome)    right hand  . Diabetes mellitus without complication (HCC)    prediabetes/ no meds  . Diverticulitis   . GERD (gastroesophageal reflux disease)   . History of anal fissures   . Hyperlipemia   . Hypertension   . Rectal bleeding    occasional  . Seasonal allergies    Past Surgical History:  Procedure Laterality Date  . ANKLE FRACTURE SURGERY  2005 / 2007   left ankle and  left leg/ have screws in leg and ankle  . CARPAL TUNNEL RELEASE Left 07/29/2019   Procedure: CARPAL TUNNEL RELEASE;  Surgeon: Cindee Salt, MD;  Location: [facility];  Service: Orthopedics;  Laterality: Left;  FOREARM BLOCK  .  EXAMINATION UNDER ANESTHESIA N/A 12/15/2012   Procedure: EXAM UNDER ANESTHESIA;  Surgeon: Atilano Ina, MD;  Location: [facility];  Service: General;  Laterality: N/A;  . FRACTURE SURGERY  2005 and 2007   lt ankle x2  . HEMORRHOIDECTOMY WITH HEMORRHOID BANDING N/A 12/15/2012   Procedure: EXCISIONAL HEMORRHOIDECTOMY WITH HEMORRHOID BANDING;  Surgeon: Atilano Ina, MD;  Location: [facility];  Service: General;  Laterality: N/A;  . rotator cuff surgery      x 2/ right shoulder  . TRIGGER FINGER RELEASE  07/23/2011   Procedure: Right hand- RELEASE TRIGGER FINGER/A-1 PULLEY;  Surgeon: Wyn Forster., MD;  Location: [facility];  Service: Orthopedics;  Laterality: Right;  . WRIST FRACTURE SURGERY  1984   lt with bone graft  . WRIST SURGERY     left wrist   Current Outpatient Medications on File Prior to Visit  Medication Sig Dispense Refill  . aspirin EC 81 MG tablet Take 81 mg by mouth daily.    . Bilberry, Vaccinium myrtillus, (BILBERRY EXTRACT PO) Take by mouth.    . Biotin 10 MG TABS Take by mouth. Biotin 10 mg every other day    . carvedilol (COREG) 12.5 MG tablet TAKE 1 TABLET BY MOUTH TWICE DAILY WITH A MEAL 180 tablet 3  . cetirizine (ZYRTEC) 10 MG tablet Take 1 tablet (10 mg total) by mouth daily. 90 tablet 3  . diphenhydrAMINE (BENADRYL) 25 mg capsule Take 25 mg by mouth every 6 (six) hours as needed.    . fenofibrate 160 MG tablet Take 1 tablet (160 mg total) by mouth daily. 90 tablet 3  . fish oil-omega-3 fatty acids 1000 MG capsule Take by mouth daily. Take 2 pills in am and 1 pill at night.    . fluticasone (FLONASE) 50 MCG/ACT nasal spray Place 2 sprays into both nostrils daily. 16 g 5  . glucosamine-chondroitin 500-400 MG tablet Take 1 tablet by mouth 3 (three) times daily. 90 tablet 3  . L-LYSINE PO Take by mouth daily at 6 (six) AM.    . Lancets (FREESTYLE) lancets Use as instructed 100 each 12  . linagliptin (TRADJENTA) 5 MG  TABS tablet Take 1 tablet (5 mg total) by mouth daily. 30 tablet 11  . losartan (COZAAR) 100 MG tablet Take 1 tablet (100 mg total) by mouth daily. 90 tablet 3  . LUTEIN PO Take 1 tablet by mouth daily.    . Multiple Vitamin (MULITIVITAMIN WITH MINERALS) TABS Take 1 tablet by mouth daily.    . NON FORMULARY Vita fusion fiber gummie-Take 5 in am and 5 in evening    . NON FORMULARY Mangostein capsule-Take 3 pills in am and 2 pills in pm.    . omeprazole (PRILOSEC) 20 MG capsule Take 1 capsule (20 mg total) by mouth 2 (two) times daily before a meal. 180 capsule 3  . ONETOUCH VERIO test strip USE AS DIRECTED 100 each 0  . pioglitazone (ACTOS) 15 MG tablet Take 1 tablet (15 mg total) by mouth daily. 30 tablet 5  . Probiotic Product (PROBIOTIC PO) Take by mouth.    . mometasone (ELOCON) 0.1 % cream Apply 1 application topically daily. 45 g 0   Current Facility-Administered Medications on File Prior to Visit  Medication Dose Route Frequency Provider Last Rate Last Admin  . 0.9 %  sodium chloride infusion  500 mL Intravenous Once Sherrilyn Rist, MD       Allergies  Allergen Reactions  . Amlodipine Other (See Comments)    Per pt - damage to kidney's  . Codeine Nausea And Vomiting  . Ivp Dye [Iodinated Diagnostic Agents]     Nauseated  . Penicillins Nausea And Vomiting    As a child  . Sulfa Antibiotics     nauseated   Social History   Socioeconomic History  .  Marital status: Single    Spouse name: Not on file  . Number of children: Not on file  . Years of education: Not on file  . Highest education level: Not on file  Occupational History  . Not on file  Tobacco Use  . Smoking status: Never Smoker  . Smokeless tobacco: Never Used  Vaping Use  . Vaping Use: Never used  Substance and Sexual Activity  . Alcohol use: No  . Drug use: No  . Sexual activity: Not on file  Other Topics Concern  . Not on file  Social History Narrative  . Not on file   Social Determinants of  Health   Financial Resource Strain:   . Difficulty of Paying Living Expenses: Not on file  Food Insecurity:   . Worried About Programme researcher, broadcasting/film/video in the Last Year: Not on file  . Ran Out of Food in the Last Year: Not on file  Transportation Needs:   . Lack of Transportation (Medical): Not on file  . Lack of Transportation (Non-Medical): Not on file  Physical Activity:   . Days of Exercise per Week: Not on file  . Minutes of Exercise per Session: Not on file  Stress:   . Feeling of Stress : Not on file  Social Connections:   . Frequency of Communication with Friends and Family: Not on file  . Frequency of Social Gatherings with Friends and Family: Not on file  . Attends Religious Services: Not on file  . Active Member of Clubs or Organizations: Not on file  . Attends Banker Meetings: Not on file  . Marital Status: Not on file  Intimate Partner Violence:   . Fear of Current or Ex-Partner: Not on file  . Emotionally Abused: Not on file  . Physically Abused: Not on file  . Sexually Abused: Not on file   Family History  Problem Relation Age of Onset  . Heart disease Mother   . Hyperlipidemia Mother   . Diabetes Mother   . Macular degeneration Mother   . Cancer Father        skin  . Macular degeneration Sister   . Diabetes Brother   . Colon cancer Neg Hx   . Stomach cancer Neg Hx   . Esophageal cancer Neg Hx   . Rectal cancer Neg Hx       Review of Systems  All other systems reviewed and are negative.      Objective:   Physical Exam Vitals reviewed.  Constitutional:      General: He is not in acute distress.    Appearance: He is well-developed. He is not diaphoretic.  Neck:     Thyroid: No thyromegaly.     Vascular: No JVD.  Cardiovascular:     Rate and Rhythm: Normal rate and regular rhythm.     Heart sounds: Normal heart sounds.  Pulmonary:     Effort: Pulmonary effort is normal. No respiratory distress.     Breath sounds: Normal breath  sounds.  Neurological:     Mental Status: He is alert.     Deep Tendon Reflexes: Reflexes are normal and symmetric.       Stage 3 chronic kidney disease, unspecified whether stage 3a or 3b CKD (HCC) - Plan: Microalbumin/Creatinine Ratio, Urine  Benign essential HTN - Plan: Microalbumin/Creatinine Ratio, Urine  Controlled type 2 diabetes mellitus without complication, without long-term current use of insulin (HCC) - Plan: Microalbumin/Creatinine Ratio, Urine  Mixed hyperlipidemia -  Plan: Microalbumin/Creatinine Ratio, Urine  Chest tightness  Spent more than 20 minutes today with the patient discussing his medical problems.  Very little exam was performed.  Almost the entire visit was spent in discussion.  I explained to the patient that he would benefit from taking either [place]Farxiga or Jardiance due to his chronic kidney disease, and his diabetes.  I would like to stop pioglitazone or Tradjenta and replace it with Farxiga 10 mg a day.  However his insurance will not cover Marcelline DeistFarxiga and therefore we ultimately started him on 25 mg a day of Jardiance.  Patient would like to stop the Actos and continue the Tradjenta based on research that he is conducted at home.  I am fine with that.  Therefore moving forward he will be on Tradjenta coupled with Jardiance.  His blood pressures are slightly high at home.  Patient seems to be very sensitive to medication and therefore I would only like to start 1 medication at a time.  If his blood pressures remain elevated after being on the Farxiga and hopefully tolerating it, I would add amlodipine to help lower his blood pressure.  I explained to the patient that I would like him to take a statin based on the medical evidence.  Patient is hesitant to take a statin but would be willing to try fluvastatin as long as he tolerates Jardiance first.  His chest pain is very atypical however I believe the next best course would be to get a stress test based on his age and risk  factors so will consult cardiology.

## 2020-02-08 NOTE — Progress Notes (Signed)
Cardiology Office Note:   Date:  02/11/2020  NAME:  Shawn Ball.    MRN: [id number] DOB:  1960/08/20   PCP:  Donita Brooks, MD  Cardiologist:  No primary care provider on file.  Electrophysiologist:  None   Referring MD: Donita Brooks, MD   Chief Complaint  Patient presents with  . New Patient (Initial Visit)  . Chest Pain  . Shortness of Breath  . Headache    History of Present Illness:   Shawn Wehrly. is a 59 y.o. male with a hx of DM, HTN, HLD who is being seen today for the evaluation of chest pain at the request of Lynnea Ferrier T, MD.  He reports he had costochondritis diagnosed 15 years ago.  Apparently is at intermittent aches in his chest since that time.  He does report for the last 2 months since getting the coronavirus vaccine he has had episodes of right and left-sided chest pain.  He reports he feels pressure.  Can occur when he is sitting.  Does not occur with exercise.  There is some positional component to it as well.  Associated symptoms also include shortness of breath.  He reports he does not exercise right now.  When he did do some light walking he does not notice any major symptoms.  He has had several issues with blood pressure over the past few weeks.  Apparently there has been some back-and-forth on diabetes medications.  He is still working with his primary care physician to decide the right dose.  His blood pressure is 112/80 today.  His EKG today demonstrates normal sinus rhythm with no acute ischemic changes or evidence of prior infarction.  He is a never smoker.  Does not drink alcohol or use drugs.  He has never had a heart attack or stroke.  He is statin intolerant.  His most recent LDL was 132.  We did discuss going on Zetia.  He is okay to do this.  He is also on a fibrate.  He has elevated triglycerides.  He also suffers with GERD.  He takes acid reflux medication.  Problem List 1. Diabetes -A1c 6.5 2. HLD -statin intolerance   -T chol 196, HDL 28, LDL 132, TG 215 3. HTN 4. Obesity  Past Medical History: Past Medical History:  Diagnosis Date  . Allergy   . Arthritis   . Chronic bronchitis   . Chronic pain    per pt/ right shoulder pain, left leg and ankle  . CKD (chronic kidney disease) stage 3, GFR 30-59 ml/min (HCC)   . Costochondral chest pain    due to torn tendons.  . CTS (carpal tunnel syndrome)    right hand  . Diabetes mellitus without complication (HCC)    prediabetes/ no meds  . Diverticulitis   . GERD (gastroesophageal reflux disease)   . History of anal fissures   . Hyperlipemia   . Hypertension   . Rectal bleeding    occasional  . Seasonal allergies     Past Surgical History: Past Surgical History:  Procedure Laterality Date  . ANKLE FRACTURE SURGERY  2005 / 2007   left ankle and  left leg/ have screws in leg and ankle  . CARPAL TUNNEL RELEASE Left 07/29/2019   Procedure: CARPAL TUNNEL RELEASE;  Surgeon: Cindee Salt, MD;  Location: [facility];  Service: Orthopedics;  Laterality: Left;  FOREARM BLOCK  . EXAMINATION UNDER ANESTHESIA N/A 12/15/2012   Procedure: Francia Greaves  UNDER ANESTHESIA;  Surgeon: Atilano Ina, MD;  Location: [facility];  Service: General;  Laterality: N/A;  . FRACTURE SURGERY  2005 and 2007   lt ankle x2  . HEMORRHOIDECTOMY WITH HEMORRHOID BANDING N/A 12/15/2012   Procedure: EXCISIONAL HEMORRHOIDECTOMY WITH HEMORRHOID BANDING;  Surgeon: Atilano Ina, MD;  Location: [facility];  Service: General;  Laterality: N/A;  . rotator cuff surgery      x 2/ right shoulder  . TRIGGER FINGER RELEASE  07/23/2011   Procedure: Right hand- RELEASE TRIGGER FINGER/A-1 PULLEY;  Surgeon: Wyn Forster., MD;  Location: [facility];  Service: Orthopedics;  Laterality: Right;  . WRIST FRACTURE SURGERY  1984   lt with bone graft  . WRIST SURGERY     left wrist    Current Medications: Current Meds  Medication Sig  .  aspirin EC 81 MG tablet Take 81 mg by mouth daily.  . Bilberry, Vaccinium myrtillus, (BILBERRY EXTRACT PO) Take by mouth.  . Biotin 10 MG TABS Take by mouth. Biotin 10 mg every other day  . carvedilol (COREG) 12.5 MG tablet TAKE 1 TABLET BY MOUTH TWICE DAILY WITH A MEAL  . cetirizine (ZYRTEC) 10 MG tablet Take 1 tablet (10 mg total) by mouth daily.  . diphenhydrAMINE (BENADRYL) 25 mg capsule Take 25 mg by mouth every 6 (six) hours as needed.  . empagliflozin (JARDIANCE) 25 MG TABS tablet Take 1 tablet (25 mg total) by mouth daily before breakfast.  . fenofibrate 160 MG tablet Take 1 tablet (160 mg total) by mouth daily.  . fish oil-omega-3 fatty acids 1000 MG capsule Take by mouth daily. Take 2 pills in am and 1 pill at night.  . fluticasone (FLONASE) 50 MCG/ACT nasal spray Place 2 sprays into both nostrils daily.  Marland Kitchen glucosamine-chondroitin 500-400 MG tablet Take 1 tablet by mouth 3 (three) times daily.  Marland Kitchen L-LYSINE PO Take by mouth daily at 6 (six) AM.  . Lancets (FREESTYLE) lancets Use as instructed  . linagliptin (TRADJENTA) 5 MG TABS tablet Take 1 tablet (5 mg total) by mouth daily.  Marland Kitchen losartan (COZAAR) 100 MG tablet Take 1 tablet (100 mg total) by mouth daily.  . LUTEIN PO Take 1 tablet by mouth daily.  . Multiple Vitamin (MULITIVITAMIN WITH MINERALS) TABS Take 1 tablet by mouth daily.  . NON FORMULARY Vita fusion fiber gummie-Take 5 in am and 5 in evening  . NON FORMULARY Mangostein capsule-Take 3 pills in am and 2 pills in pm.  . omeprazole (PRILOSEC) 20 MG capsule Take 1 capsule (20 mg total) by mouth 2 (two) times daily before a meal.  . ONETOUCH VERIO test strip USE AS DIRECTED  . Probiotic Product (PROBIOTIC PO) Take by mouth.     Allergies:    Amlodipine, Codeine, Ivp dye [iodinated diagnostic agents], Penicillins, and Sulfa antibiotics   Social History: Social History   Socioeconomic History  . Marital status: Single    Spouse name: Not on file  . Number of children: 1   . Years of education: Not on file  . Highest education level: Not on file  Occupational History  . Occupation: disabled  Tobacco Use  . Smoking status: Never Smoker  . Smokeless tobacco: Never Used  Vaping Use  . Vaping Use: Never used  Substance and Sexual Activity  . Alcohol use: No  . Drug use: No  . Sexual activity: Not on file  Other Topics Concern  . Not on  file  Social History Narrative  . Not on file   Social Determinants of Health   Financial Resource Strain: Not on file  Food Insecurity: Not on file  Transportation Needs: Not on file  Physical Activity: Not on file  Stress: Not on file  Social Connections: Not on file     Family History: The patient's family history includes Cancer in his father; Diabetes in his brother and mother; Heart disease in his mother; Hyperlipidemia in his mother; Macular degeneration in his mother and sister. There is no history of Colon cancer, Stomach cancer, Esophageal cancer, or Rectal cancer.  ROS:   All other ROS reviewed and negative. Pertinent positives noted in the HPI.     EKGs/Labs/Other Studies Reviewed:   The following studies were personally reviewed by me today:  EKG:  EKG is ordered today.  The ekg ordered today demonstrates normal sinus rhythm, heart rate 99, no acute ischemic changes, no evidence of prior infarction, and was personally reviewed by me.   Recent Labs: 02/01/2020: ALT 26; BUN 14; Creat 1.54; Hemoglobin 15.0; Platelets 289; Potassium 4.3; Sodium 140   Recent Lipid Panel    Component Value Date/Time   CHOL 196 02/01/2020 0902   TRIG 215 (H) 02/01/2020 0902   HDL 28 (L) 02/01/2020 0902   CHOLHDL 7.0 (H) 02/01/2020 0902   VLDL 32 (H) 10/07/2016 0922   LDLCALC 132 (H) 02/01/2020 0902    Physical Exam:   VS:  BP 112/80 (BP Location: Left Arm, Patient Position: Sitting, Cuff Size: Large)   Pulse 99   Ht 5\' 11"  (1.803 m)   Wt 235 lb (106.6 kg)   BMI 32.78 kg/m    Wt Readings from Last 3  Encounters:  02/11/20 235 lb (106.6 kg)  02/07/20 239 lb (108.4 kg)  10/28/19 230 lb (104.3 kg)    General: Well nourished, well developed, in no acute distress Head: Atraumatic, normal size  Eyes: PEERLA, EOMI  Neck: Supple, no JVD Endocrine: No thryomegaly Cardiac: Normal S1, S2; RRR; no murmurs, rubs, or gallops Lungs: Clear to auscultation bilaterally, no wheezing, rhonchi or rales  Abd: Soft, nontender, no hepatomegaly  Ext: No edema, pulses 2+ Musculoskeletal: No deformities, BUE and BLE strength normal and equal Skin: Warm and dry, no rashes   Neuro: Alert and oriented to person, place, time, and situation, CNII-XII grossly intact, no focal deficits  Psych: Normal mood and affect   ASSESSMENT:   Shawn BanJoseph R Vanderweele Jr. is a 59 y.o. male who presents for the following: 1. Chest pain, unspecified type   2. SOB (shortness of breath) on exertion   3. Mixed hyperlipidemia   4. Primary hypertension     PLAN:   1. Chest pain, unspecified type 2. SOB (shortness of breath) on exertion -Atypical chest pain.  EKG is normal.  He is diabetic.  Also has high blood pressure.  I recommend a Lexiscan nuclear medicine stress test for further evaluation.  He does have CKD with a creatinine of 1.54.  I think will be best to avoid a coronary CTA given his kidney function.  I do not want to cause any other issues.  I have also recommended an echocardiogram for further evaluation.  3. Mixed hyperlipidemia -Statin intolerant.  I have recommended Zetia 10 mg a day.  He will let us know if he can tolerate this.  We would then plan to recheck a cholesterol in a few months.  He should continue his fibrate for now as well.  4. Primary hypertension -Blood pressure 112/80.  He should continue his Coreg 12.5 mg twice daily and losartan 100 mg daily.  He does present with some values that are much higher over the last few weeks.  They have improved with stopping Jardiance.  His primary care physician will  look for other diabetes medications for him.  Disposition: Return in about 3 months (around 05/11/2020).  Medication Adjustments/Labs and Tests Ordered: Current medicines are reviewed at length with the patient today.  Concerns regarding medicines are outlined above.  Orders Placed This Encounter  Procedures  . Cardiac Stress Test: Informed Consent Details: Physician/Practitioner Attestation; Transcribe to consent form and obtain patient signature  . MYOCARDIAL PERFUSION IMAGING  . EKG 12-Lead  . ECHOCARDIOGRAM COMPLETE   Meds ordered this encounter  Medications  . ezetimibe (ZETIA) 10 MG tablet    Sig: Take 1 tablet (10 mg total) by mouth daily.    Dispense:  90 tablet    Refill:  3    Patient Instructions  Medication Instructions:   START EZETIMIBE 10 MG ONCE DAILY  *If you need a refill on your cardiac medications before your next appointment, please call your pharmacy*  Testing/Procedures:  Your physician has requested that you have an echocardiogram. Echocardiography is a painless test that uses sound waves to create images of your heart. It provides your doctor with information about the size and shape of your heart and how well your heart's chambers and valves are working. This procedure takes approximately one hour. There are no restrictions for this procedure.[address]  Your physician has requested that you have a lexiscan myoview. For further information please visit [email or web address] Please follow instruction sheet, as given.[address]       Follow-Up: At [facility], you and your health needs are our priority.  As part of our continuing mission to provide you with exceptional heart care, we have created designated Provider Care Teams.  These Care Teams include your primary Cardiologist (physician) and Advanced Practice Providers (APPs -  Physician Assistants and Nurse Practitioners) who all work together to provide you with the care  you need, when you need it.  We recommend signing up for the patient portal called "MyChart".  Sign up information is provided on this After Visit Summary.  MyChart is used to connect with patients for Virtual Visits (Telemedicine).  Patients are able to view lab/test results, encounter notes, upcoming appointments, etc.  Non-urgent messages can be sent to your provider as well.   To learn more about what you can do with MyChart, go to [email or web address].    Your next appointment:   3 month(s)  The format for your next appointment:   In Person  Provider:   Lennie Odor, MD       Signed, Lenna Gilford. Flora Lipps, MD [facility]  [address], [address] [place], [place] [address] [phone]  02/11/2020 1:34 PM

## 2020-02-10 ENCOUNTER — Encounter: Payer: Self-pay | Admitting: Family Medicine

## 2020-02-11 ENCOUNTER — Encounter: Payer: Self-pay | Admitting: Cardiovascular Disease

## 2020-02-11 ENCOUNTER — Other Ambulatory Visit: Payer: Self-pay

## 2020-02-11 ENCOUNTER — Ambulatory Visit (INDEPENDENT_AMBULATORY_CARE_PROVIDER_SITE_OTHER): Payer: 59 | Admitting: Cardiovascular Disease

## 2020-02-11 VITALS — BP 112/80 | HR 99 | Ht 71.0 in | Wt 235.0 lb

## 2020-02-11 DIAGNOSIS — E782 Mixed hyperlipidemia: Secondary | ICD-10-CM

## 2020-02-11 DIAGNOSIS — R0602 Shortness of breath: Secondary | ICD-10-CM

## 2020-02-11 DIAGNOSIS — I10 Essential (primary) hypertension: Secondary | ICD-10-CM

## 2020-02-11 DIAGNOSIS — R079 Chest pain, unspecified: Secondary | ICD-10-CM

## 2020-02-11 MED ORDER — EZETIMIBE 10 MG PO TABS: 10.0000 mg | ORAL_TABLET | Freq: Every day | ORAL | 3 refills | Status: DC

## 2020-02-11 NOTE — Patient Instructions (Signed)
Medication Instructions:   START EZETIMIBE 10 MG ONCE DAILY  *If you need a refill on your cardiac medications before your next appointment, please call your pharmacy*  Testing/Procedures:  Your physician has requested that you have an echocardiogram. Echocardiography is a painless test that uses sound waves to create images of your heart. It provides your doctor with information about the size and shape of your heart and how well your heart's chambers and valves are working. This procedure takes approximately one hour. There are no restrictions for this procedure.[address] [facility] [address]  Your physician has requested that you have a lexiscan myoview. For further information please visit [email or web address] Please follow instruction sheet, as given.[address]       Follow-Up: At [facility], you and your health needs are our priority.  As part of our continuing mission to provide you with exceptional heart care, we have created designated Provider Care Teams.  These Care Teams include your primary Cardiologist (physician) and Advanced Practice Providers (APPs -  Physician Assistants and Nurse Practitioners) who all work together to provide you with the care you need, when you need it.  We recommend signing up for the patient portal called "MyChart".  Sign up information is provided on this After Visit Summary.  MyChart is used to connect with patients for Virtual Visits (Telemedicine).  Patients are able to view lab/test results, encounter notes, upcoming appointments, etc.  Non-urgent messages can be sent to your provider as well.   To learn more about what you can do with MyChart, go to [email or web address].    Your next appointment:   3 month(s)  The format for your next appointment:   In Person  Provider:   Lennie Odor, MD

## 2020-02-14 ENCOUNTER — Other Ambulatory Visit: Payer: Self-pay | Admitting: Family Medicine

## 2020-02-14 ENCOUNTER — Other Ambulatory Visit: Payer: 59

## 2020-02-14 DIAGNOSIS — N183 Chronic kidney disease, stage 3 unspecified: Secondary | ICD-10-CM

## 2020-02-15 ENCOUNTER — Other Ambulatory Visit: Payer: Self-pay | Admitting: *Deleted

## 2020-02-15 DIAGNOSIS — R0602 Shortness of breath: Secondary | ICD-10-CM

## 2020-02-15 DIAGNOSIS — R079 Chest pain, unspecified: Secondary | ICD-10-CM

## 2020-02-16 MED ORDER — BEMPEDOIC ACID 180 MG PO TABS: 1.0000 | ORAL_TABLET | Freq: Every day | ORAL | 6 refills | Status: DC

## 2020-02-17 ENCOUNTER — Ambulatory Visit: Payer: 59 | Admitting: Family Medicine

## 2020-02-22 ENCOUNTER — Telehealth (HOSPITAL_COMMUNITY): Payer: Self-pay

## 2020-02-22 NOTE — Telephone Encounter (Signed)
Spoke with the patient, detailed instructions were given. He stated he understood and would be here for his test. Asked to call back with any questions. S.Duru Reiger EMTP

## 2020-02-24 ENCOUNTER — Ambulatory Visit (HOSPITAL_BASED_OUTPATIENT_CLINIC_OR_DEPARTMENT_OTHER): Payer: 59

## 2020-02-24 ENCOUNTER — Other Ambulatory Visit: Payer: Self-pay

## 2020-02-24 ENCOUNTER — Ambulatory Visit (HOSPITAL_COMMUNITY): Payer: 59 | Attending: Internal Medicine

## 2020-02-24 DIAGNOSIS — R079 Chest pain, unspecified: Secondary | ICD-10-CM | POA: Insufficient documentation

## 2020-02-24 LAB — ECHOCARDIOGRAM COMPLETE
Area-P 1/2: 4.41 cm2
Height: 71 in
S' Lateral: 3.1 cm
Weight: 3760 oz

## 2020-02-24 LAB — MYOCARDIAL PERFUSION IMAGING
LV dias vol: 87 mL (ref 62–150)
LV sys vol: 39 mL
Peak HR: 121 {beats}/min
Rest HR: 72 {beats}/min
SDS: 2
SRS: 0
SSS: 2
TID: 1.03

## 2020-02-24 MED ORDER — REGADENOSON 0.4 MG/5ML IV SOLN
0.4000 mg | Freq: Once | INTRAVENOUS | Status: AC
  Administered 2020-02-24: 0.4 mg via INTRAVENOUS

## 2020-02-24 MED ORDER — TECHNETIUM TC 99M TETROFOSMIN IV KIT
11.0000 | PACK | Freq: Once | INTRAVENOUS | Status: AC | PRN
  Administered 2020-02-24: 11 via INTRAVENOUS
  Filled 2020-02-24: qty 11

## 2020-02-24 MED ORDER — PERFLUTREN LIPID MICROSPHERE
1.0000 mL | INTRAVENOUS | Status: AC | PRN
  Administered 2020-02-24: 2 mL via INTRAVENOUS

## 2020-02-24 MED ORDER — TECHNETIUM TC 99M TETROFOSMIN IV KIT
31.7000 | PACK | Freq: Once | INTRAVENOUS | Status: AC | PRN
  Administered 2020-02-24: 31.7 via INTRAVENOUS
  Filled 2020-02-24: qty 32

## 2020-02-27 ENCOUNTER — Encounter (HOSPITAL_COMMUNITY): Payer: Self-pay | Admitting: Emergency Medicine

## 2020-02-27 ENCOUNTER — Other Ambulatory Visit: Payer: Self-pay

## 2020-02-27 ENCOUNTER — Ambulatory Visit (HOSPITAL_COMMUNITY)
Admission: EM | Admit: 2020-02-27 | Discharge: 2020-02-27 | Disposition: A | Discharge status: Home / Self Care | Payer: 59 | Attending: Emergency Medicine | Admitting: Emergency Medicine

## 2020-02-27 DIAGNOSIS — R140 Abdominal distension (gaseous): Secondary | ICD-10-CM

## 2020-02-27 MED ORDER — SIMETHICONE 125 MG PO TABS: 125.0000 mg | ORAL_TABLET | Freq: Four times a day (QID) | ORAL | 0 refills | Status: DC | PRN

## 2020-02-27 NOTE — Discharge Instructions (Addendum)
Try the simethicone, continue peppermint.

## 2020-02-27 NOTE — ED Triage Notes (Signed)
Pt c/o of left lower abd pain x 9 days, +bloating, denies n/v/d.

## 2020-02-27 NOTE — ED Provider Notes (Signed)
HPI  SUBJECTIVE:  Shawn Ball. is a 59 y.o. male who presents with 8-9 days of abdominal bloating and discomfort after starting ezitamibe.  Took it for a day, discontinued it on 12/18.  He reports intermittent left-sided abdominal pain described as squeezing, intermittent, lasting seconds.  He denies fevers, nausea, vomiting, diarrhea, constipation, urinary complaints, change in urine output, melena, hematochezia.  He recently discontinued fiber because he thought that he might have diverticulitis, but no other change in his diet.  No other recent change in his medications.  The pain is not getting worse.  His primary issue is abdominal bloating.  He has tried flaxseed meal, stool softeners, elderberry syrup, probiotics, peppermint.  He states that the peppermint and probiotics seem to help.  Symptoms are worse with eating.  His symptoms are not associated with movement, urination or defecation.  He has a past medical history of chronic kidney disease stage III, hypertension, hypercholesterolemia, diabetes, diverticulitis/diverticulosis.  His last colonoscopy was 3 years ago.  No abdominal surgeries, atrial fibrillation, hypercoagulability, PAD/PVD, mesenteric ischemia, pancreatitis.  PMD: States that he is establishing care with Dr. Latanya Maudlin at [facility] on January 10.    Past Medical History:  Diagnosis Date  . Allergy   . Arthritis   . Chronic bronchitis   . Chronic pain    per pt/ right shoulder pain, left leg and ankle  . CKD (chronic kidney disease) stage 3, GFR 30-59 ml/min (HCC)   . Costochondral chest pain    due to torn tendons.  . CTS (carpal tunnel syndrome)    right hand  . Diabetes mellitus without complication (HCC)    prediabetes/ no meds  . Diverticulitis   . GERD (gastroesophageal reflux disease)   . History of anal fissures   . Hyperlipemia   . Hypertension   . Rectal bleeding    occasional  . Seasonal allergies     Past Surgical History:   Procedure Laterality Date  . ANKLE FRACTURE SURGERY  2005 / 2007   left ankle and  left leg/ have screws in leg and ankle  . CARPAL TUNNEL RELEASE Left 07/29/2019   Procedure: CARPAL TUNNEL RELEASE;  Surgeon: Cindee Salt, MD;  Location: [facility];  Service: Orthopedics;  Laterality: Left;  FOREARM BLOCK  . EXAMINATION UNDER ANESTHESIA N/A 12/15/2012   Procedure: EXAM UNDER ANESTHESIA;  Surgeon: Atilano Ina, MD;  Location: [facility];  Service: General;  Laterality: N/A;  . FRACTURE SURGERY  2005 and 2007   lt ankle x2  . HEMORRHOIDECTOMY WITH HEMORRHOID BANDING N/A 12/15/2012   Procedure: EXCISIONAL HEMORRHOIDECTOMY WITH HEMORRHOID BANDING;  Surgeon: Atilano Ina, MD;  Location: [facility];  Service: General;  Laterality: N/A;  . rotator cuff surgery      x 2/ right shoulder  . TRIGGER FINGER RELEASE  07/23/2011   Procedure: Right hand- RELEASE TRIGGER FINGER/A-1 PULLEY;  Surgeon: Wyn Forster., MD;  Location: [facility];  Service: Orthopedics;  Laterality: Right;  . WRIST FRACTURE SURGERY  1984   lt with bone graft  . WRIST SURGERY     left wrist    Family History  Problem Relation Age of Onset  . Heart disease Mother   . Hyperlipidemia Mother   . Diabetes Mother   . Macular degeneration Mother   . Cancer Father        skin  . Macular degeneration Sister   . Diabetes Brother   .  Colon cancer Neg Hx   . Stomach cancer Neg Hx   . Esophageal cancer Neg Hx   . Rectal cancer Neg Hx     Social History   Tobacco Use  . Smoking status: Never Smoker  . Smokeless tobacco: Never Used  Vaping Use  . Vaping Use: Never used  Substance Use Topics  . Alcohol use: No  . Drug use: No    No current facility-administered medications for this encounter.  Current Outpatient Medications:  .  aspirin EC 81 MG tablet, Take 81 mg by mouth daily., Disp: , Rfl:  .  Bempedoic Acid 180 MG TABS, Take 1 tablet by mouth  daily., Disp: 30 tablet, Rfl: 6 .  Bilberry, Vaccinium myrtillus, (BILBERRY EXTRACT PO), Take by mouth., Disp: , Rfl:  .  Biotin 10 MG TABS, Take by mouth. Biotin 10 mg every other day, Disp: , Rfl:  .  carvedilol (COREG) 12.5 MG tablet, TAKE 1 TABLET BY MOUTH TWICE DAILY WITH A MEAL, Disp: 180 tablet, Rfl: 3 .  cetirizine (ZYRTEC) 10 MG tablet, Take 1 tablet (10 mg total) by mouth daily., Disp: 90 tablet, Rfl: 3 .  diphenhydrAMINE (BENADRYL) 25 mg capsule, Take 25 mg by mouth every 6 (six) hours as needed., Disp: , Rfl:  .  empagliflozin (JARDIANCE) 25 MG TABS tablet, Take 1 tablet (25 mg total) by mouth daily before breakfast., Disp: 30 tablet, Rfl: 3 .  fenofibrate 160 MG tablet, Take 1 tablet (160 mg total) by mouth daily., Disp: 90 tablet, Rfl: 3 .  fish oil-omega-3 fatty acids 1000 MG capsule, Take by mouth daily. Take 2 pills in am and 1 pill at night., Disp: , Rfl:  .  fluticasone (FLONASE) 50 MCG/ACT nasal spray, Place 2 sprays into both nostrils daily., Disp: 16 g, Rfl: 5 .  glucosamine-chondroitin 500-400 MG tablet, Take 1 tablet by mouth 3 (three) times daily., Disp: 90 tablet, Rfl: 3 .  L-LYSINE PO, Take by mouth daily at 6 (six) AM., Disp: , Rfl:  .  Lancets (FREESTYLE) lancets, Use as instructed, Disp: 100 each, Rfl: 12 .  linagliptin (TRADJENTA) 5 MG TABS tablet, Take 1 tablet (5 mg total) by mouth daily., Disp: 30 tablet, Rfl: 11 .  losartan (COZAAR) 100 MG tablet, Take 1 tablet (100 mg total) by mouth daily., Disp: 90 tablet, Rfl: 3 .  LUTEIN PO, Take 1 tablet by mouth daily., Disp: , Rfl:  .  mometasone (ELOCON) 0.1 % cream, Apply 1 application topically daily., Disp: 45 g, Rfl: 0 .  Multiple Vitamin (MULITIVITAMIN WITH MINERALS) TABS, Take 1 tablet by mouth daily., Disp: , Rfl:  .  NON FORMULARY, Vita fusion fiber gummie-Take 5 in am and 5 in evening, Disp: , Rfl:  .  NON FORMULARY, Mangostein capsule-Take 3 pills in am and 2 pills in pm., Disp: , Rfl:  .  omeprazole  (PRILOSEC) 20 MG capsule, Take 1 capsule (20 mg total) by mouth 2 (two) times daily before a meal., Disp: 180 capsule, Rfl: 3 .  ONETOUCH VERIO test strip, USE AS DIRECTED, Disp: 100 each, Rfl: 0 .  Probiotic Product (PROBIOTIC PO), Take by mouth., Disp: , Rfl:  .  Simethicone 125 MG TABS, Take 1 tablet (125 mg total) by mouth 4 (four) times daily as needed for up to 10 days., Disp: 40 tablet, Rfl: 0  Allergies  Allergen Reactions  . Amlodipine Other (See Comments)    Per pt - damage to kidney's  . Codeine Nausea And  Vomiting  . Ivp Dye [Iodinated Diagnostic Agents]     Nauseated  . Penicillins Nausea And Vomiting    As a child  . Sulfa Antibiotics     nauseated     ROS  As noted in HPI.   Physical Exam  BP (!) 138/97 (BP Location: Left Arm)   Pulse 83   Temp 98.7 F (37.1 C) (Oral)   Resp 20   SpO2 96%   Constitutional: Well developed, well nourished, no acute distress Eyes:  EOMI, conjunctiva normal bilaterally HENT: Normocephalic, atraumatic,mucus membranes moist Respiratory: Normal inspiratory effort Cardiovascular: Normal rate GI: Normal appearance.  Mild distention, soft, nontender to deep palpation, no guarding, rebound.  Active bowel sounds.  Negative Eulah Pont, McBurney. Back: No CVAT skin: No rash, skin intact Musculoskeletal: no deformities Neurologic: Alert & oriented x 3, no focal neuro deficits Psychiatric: Speech and behavior appropriate   ED Course   Medications - No data to display  No orders of the defined types were placed in this encounter.   No results found for this or any previous visit (from the past 24 hour(s)). No results found.  ED Clinical Impression  1. Abdominal bloating      ED Assessment/Plan  Pt abd exam is benign, no peritoneal signs. No evidence of surgical abd. Doubt SBO, mesenteric ischemia, appendicitis, hepatitis, cholecystitis, pancreatitis, or perforated viscus.  Suspect gas is causing his symptoms.  We will have him  restart his fiber.  Do not think this is diverticulitis as he has no tenderness.  Discussed this with patient.  Discussed that do not think antibiotics would be particularly helpful at this time.  Will try some simethicone 125 mg 4 times daily as needed, and he will follow-up with Dr. Sanda Linger as needed.  ER return precautions given.   Discussed MDM, treatment plan, and plan for follow-up with patient. Discussed sn/sx that should prompt return to the ED. patient agrees with plan.   Meds ordered this encounter  Medications  . DISCONTD: Simethicone 125 MG TABS    Sig: Take 1 tablet (125 mg total) by mouth 4 (four) times daily as needed for up to 10 days.    Dispense:  120 tablet    Refill:  0  . Simethicone 125 MG TABS    Sig: Take 1 tablet (125 mg total) by mouth 4 (four) times daily as needed for up to 10 days.    Dispense:  40 tablet    Refill:  0    *This clinic note was created using Scientist, clinical (histocompatibility and immunogenetics). Therefore, there may be occasional mistakes despite careful proofreading.   ?    Domenick Gong, MD 02/27/20 1655

## 2020-02-29 ENCOUNTER — Telehealth: Payer: Self-pay | Admitting: Cardiovascular Disease

## 2020-02-29 NOTE — Telephone Encounter (Signed)
[facility] needs documentation as to why the patient was prescribed Nexletol and that patient is intolerant to statins  Please fax information to [phone]

## 2020-02-29 NOTE — Telephone Encounter (Signed)
Routed via epic- also faxed along with PA today 12/28

## 2020-03-01 NOTE — Telephone Encounter (Signed)
Victorino December probably not going to get a PA for this.  Nexletol is only indicated for secondary prevention or familial hyperlipidemia.  Since his LDL I s< 190 and he has not had a prior CV event, they won't approve.

## 2020-03-02 NOTE — Telephone Encounter (Signed)
What does he need explained? It appears I can't get him on nexlitol. If he cannot tolerate zetia, I don't have any options for him. His stress test was normal.  Gerri Spore T. Flora Lipps, MD [facility]  [address] [place], [place] [address] [phone]  2:34 PM

## 2020-03-08 DIAGNOSIS — M25572 Pain in left ankle and joints of left foot: Secondary | ICD-10-CM | POA: Insufficient documentation

## 2020-03-12 DIAGNOSIS — M12579 Traumatic arthropathy, unspecified ankle and foot: Secondary | ICD-10-CM | POA: Insufficient documentation

## 2020-03-13 LAB — BASIC METABOLIC PANEL
BUN: 15 (ref 4–21)
CO2: 26 — AB (ref 13–22)
Chloride: 104 (ref 99–108)
Creatinine: 1.2 (ref 0.6–1.3)
Glucose: 111
Potassium: 4.9 (ref 3.4–5.3)
Sodium: 140 (ref 137–147)

## 2020-03-13 LAB — HEPATIC FUNCTION PANEL
ALT: 31 (ref 10–40)
AST: 20 (ref 14–40)
Alkaline Phosphatase: 64 (ref 25–125)
Bilirubin, Total: 0.3

## 2020-03-13 LAB — CBC AND DIFFERENTIAL
HCT: 43 (ref 41–53)
Hemoglobin: 14.9 (ref 13.5–17.5)
Platelets: 270 (ref 150–399)
WBC: 5.4

## 2020-03-13 LAB — MICROALBUMIN, URINE: Microalb, Ur: 40.9

## 2020-03-13 LAB — COMPREHENSIVE METABOLIC PANEL
Albumin: 4.4 (ref 3.5–5.0)
Calcium: 10 (ref 8.7–10.7)
GFR calc non Af Amer: 66
Globulin: 1.9

## 2020-03-13 LAB — IRON,TIBC AND FERRITIN PANEL
Ferritin: 104
Iron: 62

## 2020-03-15 ENCOUNTER — Encounter: Payer: Self-pay | Admitting: Internal Medicine

## 2020-03-15 ENCOUNTER — Other Ambulatory Visit: Payer: Self-pay

## 2020-03-15 ENCOUNTER — Ambulatory Visit: Payer: 59 | Admitting: Internal Medicine

## 2020-03-15 VITALS — BP 128/88 | HR 74 | Temp 97.8°F | Ht 71.0 in | Wt 230.0 lb

## 2020-03-15 DIAGNOSIS — I10 Essential (primary) hypertension: Secondary | ICD-10-CM | POA: Diagnosis not present

## 2020-03-15 DIAGNOSIS — E119 Type 2 diabetes mellitus without complications: Secondary | ICD-10-CM

## 2020-03-15 DIAGNOSIS — E785 Hyperlipidemia, unspecified: Secondary | ICD-10-CM

## 2020-03-15 DIAGNOSIS — N182 Chronic kidney disease, stage 2 (mild): Secondary | ICD-10-CM

## 2020-03-15 DIAGNOSIS — E781 Pure hyperglyceridemia: Secondary | ICD-10-CM

## 2020-03-15 DIAGNOSIS — E1122 Type 2 diabetes mellitus with diabetic chronic kidney disease: Secondary | ICD-10-CM

## 2020-03-15 DIAGNOSIS — Z23 Encounter for immunization: Secondary | ICD-10-CM

## 2020-03-15 LAB — POCT GLYCOSYLATED HEMOGLOBIN (HGB A1C): Hemoglobin A1C: 6.4 % — AB (ref 4.0–5.6)

## 2020-03-15 MED ORDER — PRAVASTATIN SODIUM 20 MG PO TABS: 20.0000 mg | ORAL_TABLET | Freq: Every day | ORAL | 1 refills | Status: DC

## 2020-03-15 MED ORDER — ICOSAPENT ETHYL 1 G PO CAPS: 2.0000 g | ORAL_CAPSULE | Freq: Two times a day (BID) | ORAL | 1 refills | Status: DC

## 2020-03-15 MED ORDER — METFORMIN HCL 500 MG PO TABS: 500.0000 mg | ORAL_TABLET | Freq: Two times a day (BID) | ORAL | 1 refills | Status: DC

## 2020-03-15 MED ORDER — PRAVASTATIN SODIUM 20 MG PO TABS
20.0000 mg | ORAL_TABLET | Freq: Every day | ORAL | 1 refills | Status: DC
Start: 2020-03-15 — End: 2020-08-28

## 2020-03-15 NOTE — Patient Instructions (Signed)
Type 2 Diabetes Mellitus, Diagnosis, Adult Type 2 diabetes (type 2 diabetes mellitus) is a long-term, or chronic, disease. In type 2 diabetes, one or both of these problems may be present:  The pancreas does not make enough of a hormone called insulin.  Cells in the body do not respond properly to insulin that the body makes (insulin resistance). Normally, insulin allows blood sugar (glucose) to enter cells in the body. The cells use glucose for energy. Insulin resistance or lack of insulin causes excess glucose to build up in the blood instead of going into cells. This causes high blood glucose (hyperglycemia).  What are the causes? The exact cause of type 2 diabetes is not known. What increases the risk? The following factors may make you more likely to develop this condition:  Having a family member with type 2 diabetes.  Being overweight or obese.  Being inactive (sedentary).  Having been diagnosed with insulin resistance.  Having a history of prediabetes, diabetes when you were pregnant (gestational diabetes), or polycystic ovary syndrome (PCOS). What are the signs or symptoms? In the early stage of this condition, you may not have symptoms. Symptoms develop slowly and may include:  Increased thirst or hunger.  Increased urination.  Unexplained weight loss.  Tiredness (fatigue) or weakness.  Vision changes, such as blurry vision.  Dark patches on the skin. How is this diagnosed? This condition is diagnosed based on your symptoms, your medical history, a physical exam, and your blood glucose level. Your blood glucose may be checked with one or more of the following blood tests:  A fasting blood glucose (FBG) test. You will not be allowed to eat (you will fast) for 8 hours or longer before a blood sample is taken.  A random blood glucose test. This test checks blood glucose at any time of day regardless of when you ate.  An A1C (hemoglobin A1C) blood test. This test  provides information about blood glucose levels over the previous 2-3 months.  An oral glucose tolerance test (OGTT). This test measures your blood glucose at two times: ? After fasting. This is your baseline blood glucose level. ? Two hours after drinking a beverage that contains glucose. You may be diagnosed with type 2 diabetes if:  Your fasting blood glucose level is 126 mg/dL (7.0 mmol/L) or higher.  Your random blood glucose level is 200 mg/dL (11.1 mmol/L) or higher.  Your A1C level is 6.5% or higher.  Your oral glucose tolerance test result is higher than 200 mg/dL (11.1 mmol/L). These blood tests may be repeated to confirm your diagnosis.   How is this treated? Your treatment may be managed by a specialist called an endocrinologist. Type 2 diabetes may be treated by following instructions from your health care provider about:  Making dietary and lifestyle changes. These may include: ? Following a personalized nutrition plan that is developed by a registered dietitian. ? Exercising regularly. ? Finding ways to manage stress.  Checking your blood glucose level as often as told.  Taking diabetes medicines or insulin daily. This helps to keep your blood glucose levels in the healthy range.  Taking medicines to help prevent complications from diabetes. Medicines may include: ? Aspirin. ? Medicine to lower cholesterol. ? Medicine to control blood pressure. Your health care provider will set treatment goals for you. Your goals will be based on your age, other medical conditions you have, and how you respond to diabetes treatment. Generally, the goal of treatment is to maintain the   following blood glucose levels:  Before meals: 80-130 mg/dL (4.4-7.2 mmol/L).  After meals: below 180 mg/dL (10 mmol/L).  A1C level: less than 7%. Follow these instructions at home: Questions to ask your health care provider Consider asking the following questions:  Should I meet with a certified  diabetes care and education specialist?  What diabetes medicines do I need, and when should I take them?  What equipment will I need to manage my diabetes at home?  How often do I need to check my blood glucose?  Where can I find a support group for people with diabetes?  What number can I call if I have questions?  When is my next appointment? General instructions  Take over-the-counter and prescription medicines only as told by your health care provider.  Keep all follow-up visits as told by your health care provider. This is important. Where to find more information  American Diabetes Association (ADA): www.diabetes.org  American Association of Diabetes Care and Education Specialists (ADCES): www.diabeteseducator.org  International Diabetes Federation (IDF): www.idf.org Contact a health care provider if:  Your blood glucose is at or above 240 mg/dL (13.3 mmol/L) for 2 days in a row.  You have been sick or have had a fever for 2 days or longer, and you are not getting better.  You have any of the following problems for more than 6 hours: ? You cannot eat or drink. ? You have nausea and vomiting. ? You have diarrhea. Get help right away if:  You have severe hypoglycemia. This means your blood glucose is lower than 54 mg/dL (3.0 mmol/L).  You become confused or you have trouble thinking clearly.  You have difficulty breathing.  You have moderate or large ketone levels in your urine. These symptoms may represent a serious problem that is an emergency. Do not wait to see if the symptoms will go away. Get medical help right away. Call your local emergency services (911 in the [place] Do not drive yourself to the hospital. Summary  Type 2 diabetes (type 2 diabetes mellitus) is a long-term, or chronic, disease. In type 2 diabetes, the pancreas does not make enough of a hormone called insulin, or cells in the body do not respond properly to insulin that the body makes (insulin  resistance).  This condition is treated by making dietary and lifestyle changes and taking diabetes medicines or insulin.  Your health care provider will set treatment goals for you. Your goals will be based on your age, other medical conditions you have, and how you respond to diabetes treatment.  Keep all follow-up visits as told by your health care provider. This is important. This information is not intended to replace advice given to you by your health care provider. Make sure you discuss any questions you have with your health care provider. Document Revised: 09/15/2019 Document Reviewed: 09/15/2019 Elsevier Patient Education  2021 Elsevier Inc.  

## 2020-03-15 NOTE — Progress Notes (Signed)
Subjective:  Patient ID: Shawn BanJoseph R Jacko Jr., male    DOB: 04-18-1960  Age: 60 y.o. MRN: [id number]  CC: Hypertension, Hyperlipidemia, and Diabetes  This visit occurred during the SARS-CoV-2 public health emergency.  Safety protocols were in place, including screening questions prior to the visit, additional usage of staff PPE, and extensive cleaning of exam room while observing appropriate contact time as indicated for disinfecting solutions.    HPI Shawn BanJoseph R Ruben Jr. presents for establishing.  He tells me he has recently been taking his mother's supply of metformin to control his blood sugar.  He says whenever he takes a statin combined with the fibric acid derivative he develops muscle aches.  He would like to know if he could try a something other than fenofibrate to control his triglycerides.  He would also like to try pravastatin.  He is active and denies any recent episodes of chest pain, shortness of breath, palpitations, edema, or fatigue.  He has a history of renal insufficiency and recently saw his nephrologist and his renal function had improved.  History Jomarie LongsJoseph has a past medical history of Allergy, Arthritis, Chronic bronchitis, Chronic pain, CKD (chronic kidney disease) stage 3, GFR 30-59 ml/min (HCC), Costochondral chest pain, CTS (carpal tunnel syndrome), Diabetes mellitus without complication (HCC), Diverticulitis, GERD (gastroesophageal reflux disease), History of anal fissures, Hyperlipemia, Hypertension, Rectal bleeding, and Seasonal allergies.   He has a past surgical history that includes Ankle fracture surgery (2005 / 2007); Wrist surgery; Fracture surgery (2005 and 2007); Wrist fracture surgery (1984); Trigger finger release (07/23/2011); Examination under anesthesia (N/A, 12/15/2012); Hemorrhoidectomy with hemorrhoid banding (N/A, 12/15/2012); rotator cuff surgery ; and Carpal tunnel release (Left, 07/29/2019).   His family history includes Cancer in his father;  Diabetes in his brother and mother; Heart disease in his mother; Hyperlipidemia in his mother; Macular degeneration in his mother and sister.He reports that he has never smoked. He has never used smokeless tobacco. He reports that he does not drink alcohol and does not use drugs.  Outpatient Medications Prior to Visit  Medication Sig Dispense Refill   aspirin EC 81 MG tablet Take 81 mg by mouth daily.     Bilberry, Vaccinium myrtillus, (BILBERRY EXTRACT PO) Take by mouth.     carvedilol (COREG) 12.5 MG tablet TAKE 1 TABLET BY MOUTH TWICE DAILY WITH A MEAL 180 tablet 3   cetirizine (ZYRTEC) 10 MG tablet Take 1 tablet (10 mg total) by mouth daily. 90 tablet 3   fluticasone (FLONASE) 50 MCG/ACT nasal spray Place 2 sprays into both nostrils daily. 16 g 5   Lancets (FREESTYLE) lancets Use as instructed 100 each 12   losartan (COZAAR) 100 MG tablet Take 1 tablet (100 mg total) by mouth daily. 90 tablet 3   Multiple Vitamin (MULITIVITAMIN WITH MINERALS) TABS Take 1 tablet by mouth daily.     omeprazole (PRILOSEC) 20 MG capsule Take 1 capsule (20 mg total) by mouth 2 (two) times daily before a meal. 180 capsule 3   ONETOUCH VERIO test strip USE AS DIRECTED 100 each 0   Probiotic Product (PROBIOTIC PO) Take by mouth.     Biotin 10 MG TABS Take by mouth. Biotin 10 mg every other day     diphenhydrAMINE (BENADRYL) 25 mg capsule Take 25 mg by mouth every 6 (six) hours as needed.     fenofibrate 160 MG tablet Take 1 tablet (160 mg total) by mouth daily. 90 tablet 3   fish oil-omega-3 fatty acids 1000 MG  capsule Take by mouth daily. Take 2 pills in am and 1 pill at night.     glucosamine-chondroitin 500-400 MG tablet Take 1 tablet by mouth 3 (three) times daily. 90 tablet 3   L-LYSINE PO Take by mouth daily at 6 (six) AM.     LUTEIN PO Take 1 tablet by mouth daily.     NON FORMULARY Vita fusion fiber gummie-Take 5 in am and 5 in evening     NON FORMULARY Mangostein capsule-Take 3 pills  in am and 2 pills in pm.     Bempedoic Acid 180 MG TABS Take 1 tablet by mouth daily. 30 tablet 6   empagliflozin (JARDIANCE) 25 MG TABS tablet Take 1 tablet (25 mg total) by mouth daily before breakfast. 30 tablet 3   linagliptin (TRADJENTA) 5 MG TABS tablet Take 1 tablet (5 mg total) by mouth daily. 30 tablet 11   mometasone (ELOCON) 0.1 % cream Apply 1 application topically daily. 45 g 0   Simethicone 125 MG TABS Take 1 tablet (125 mg total) by mouth 4 (four) times daily as needed for up to 10 days. 40 tablet 0   No facility-administered medications prior to visit.    ROS Review of Systems  Constitutional: Negative.  Negative for chills, diaphoresis, fatigue and unexpected weight change.  HENT: Negative.   Eyes: Negative for visual disturbance.  Respiratory: Negative for cough, chest tightness, shortness of breath and wheezing.   Cardiovascular: Negative for chest pain, palpitations and leg swelling.  Gastrointestinal: Negative for abdominal pain, constipation, diarrhea, nausea and vomiting.  Endocrine: Negative.  Negative for polydipsia, polyphagia and polyuria.  Genitourinary: Negative.  Negative for difficulty urinating.  Musculoskeletal: Negative for arthralgias and myalgias.  Skin: Negative.  Negative for color change and pallor.  Neurological: Negative.  Negative for dizziness, weakness and light-headedness.  Hematological: Negative for adenopathy. Does not bruise/bleed easily.  Psychiatric/Behavioral: Negative.     Objective:  BP 128/88    Pulse 74    Temp 97.8 F (36.6 C) (Oral)    Ht 5\' 11"  (1.803 m)    Wt 230 lb (104.3 kg)    SpO2 98%    BMI 32.08 kg/m   Physical Exam Vitals reviewed.  HENT:     Nose: Nose normal.     Mouth/Throat:     Mouth: Mucous membranes are moist.  Eyes:     General: No scleral icterus.    Conjunctiva/sclera: Conjunctivae normal.  Cardiovascular:     Rate and Rhythm: Normal rate and regular rhythm.     Heart sounds: No murmur  heard.   Pulmonary:     Effort: Pulmonary effort is normal.     Breath sounds: No stridor. No wheezing, rhonchi or rales.  Abdominal:     General: Abdomen is flat. Bowel sounds are normal. There is no distension.     Palpations: Abdomen is soft. There is no hepatomegaly, splenomegaly or mass.     Tenderness: There is no abdominal tenderness.  Musculoskeletal:        General: Normal range of motion.     Cervical back: Neck supple.     Right lower leg: No edema.     Left lower leg: No edema.  Lymphadenopathy:     Cervical: No cervical adenopathy.  Skin:    General: Skin is warm and dry.     Coloration: Skin is not pale.  Neurological:     General: No focal deficit present.     Mental Status: He  is alert.  Psychiatric:        Mood and Affect: Mood normal.        Behavior: Behavior normal.     Lab Results  Component Value Date   WBC 5.4 03/13/2020   HGB 14.9 03/13/2020   HCT 43 03/13/2020   PLT 270 03/13/2020   GLUCOSE 117 (H) 02/01/2020   CHOL 196 02/01/2020   TRIG 215 (H) 02/01/2020   HDL 28 (L) 02/01/2020   LDLCALC 132 (H) 02/01/2020   ALT 31 03/13/2020   AST 20 03/13/2020   NA 140 03/13/2020   K 4.9 03/13/2020   CL 104 03/13/2020   CREATININE 1.2 03/13/2020   BUN 15 03/13/2020   CO2 26 (A) 03/13/2020   PSA 0.4 04/29/2019   HGBA1C 6.4 (A) 03/15/2020   MICROALBUR 40.9 03/13/2020    Assessment & Plan:   Venus was seen today for hypertension, hyperlipidemia and diabetes.  Diagnoses and all orders for this visit:  Benign essential HTN- His blood pressure is adequately well controlled.  Diabetes mellitus without complication (HCC) -     POCT glycosylated hemoglobin (Hb A1C)  Type 2 diabetes mellitus with stage 2 chronic kidney disease, without long-term current use of insulin (HCC)- His blood sugar is adequately well controlled.  Will continue the current dose of metformin. -     POCT glycosylated hemoglobin (Hb A1C) -     metFORMIN (GLUCOPHAGE) 500 MG  tablet; Take 1 tablet (500 mg total) by mouth 2 (two) times daily with a meal.  Need for prophylactic vaccination with combined diphtheria-tetanus-pertussis (DTP) vaccine -     Tdap vaccine greater than or equal to 7yo IM  Need for prophylactic vaccination against Streptococcus pneumoniae (pneumococcus) -     Pneumococcal conjugate vaccine 13-valent  Hyperlipidemia LDL goal <70- He agrees to take a statin for cardiovascular risk reduction. -     Discontinue: pravastatin (PRAVACHOL) 20 MG tablet; Take 1 tablet (20 mg total) by mouth daily. -     pravastatin (PRAVACHOL) 20 MG tablet; Take 1 tablet (20 mg total) by mouth daily.  Hypertriglyceridemia- Since a combination of fenofibrate and statins caused myopathy I recommended that he treat the high triglycerides with an omega-3 fish oil supplement. -     Discontinue: icosapent Ethyl (VASCEPA) 1 g capsule; Take 2 capsules (2 g total) by mouth 2 (two) times daily. -     Discontinue: icosapent Ethyl (VASCEPA) 1 g capsule; Take 2 capsules (2 g total) by mouth 2 (two) times daily. -     omega-3 acid ethyl esters (LOVAZA) 1 g capsule; Take 2 capsules (2 g total) by mouth 2 (two) times daily.   I have discontinued Romona Curls. Erline Levine. "Reggie"'s LUTEIN PO, Biotin, fish oil-omega-3 fatty acids, mometasone, L-LYSINE PO, NON FORMULARY, NON FORMULARY, diphenhydrAMINE, linagliptin, glucosamine-chondroitin, fenofibrate, empagliflozin, Bempedoic Acid, Simethicone, icosapent Ethyl, and icosapent Ethyl. I am also having him start on metFORMIN and omega-3 acid ethyl esters. Additionally, I am having him maintain his aspirin EC, multivitamin with minerals, fluticasone, (Bilberry, Vaccinium myrtillus, (BILBERRY EXTRACT PO)), Probiotic Product (PROBIOTIC PO), losartan, omeprazole, carvedilol, cetirizine, freestyle, OneTouch Verio, and pravastatin.  Meds ordered this encounter  Medications   DISCONTD: pravastatin (PRAVACHOL) 20 MG tablet    Sig: Take 1 tablet (20  mg total) by mouth daily.    Dispense:  90 tablet    Refill:  1   DISCONTD: icosapent Ethyl (VASCEPA) 1 g capsule    Sig: Take 2 capsules (2 g total)  by mouth 2 (two) times daily.    Dispense:  360 capsule    Refill:  1   DISCONTD: icosapent Ethyl (VASCEPA) 1 g capsule    Sig: Take 2 capsules (2 g total) by mouth 2 (two) times daily.    Dispense:  360 capsule    Refill:  1   pravastatin (PRAVACHOL) 20 MG tablet    Sig: Take 1 tablet (20 mg total) by mouth daily.    Dispense:  90 tablet    Refill:  1   metFORMIN (GLUCOPHAGE) 500 MG tablet    Sig: Take 1 tablet (500 mg total) by mouth 2 (two) times daily with a meal.    Dispense:  180 tablet    Refill:  1   omega-3 acid ethyl esters (LOVAZA) 1 g capsule    Sig: Take 2 capsules (2 g total) by mouth 2 (two) times daily.    Dispense:  360 capsule    Refill:  1     Follow-up: Return in about 4 months (around 07/13/2020).  Sanda Linger, MD

## 2020-03-16 ENCOUNTER — Encounter: Payer: Self-pay | Admitting: Internal Medicine

## 2020-03-17 MED ORDER — OMEGA-3-ACID ETHYL ESTERS 1 G PO CAPS: 2.0000 g | ORAL_CAPSULE | Freq: Two times a day (BID) | ORAL | 1 refills | Status: DC

## 2020-03-20 ENCOUNTER — Other Ambulatory Visit: Payer: Self-pay | Admitting: Family Medicine

## 2020-03-21 ENCOUNTER — Other Ambulatory Visit: Payer: Self-pay | Admitting: *Deleted

## 2020-03-21 ENCOUNTER — Other Ambulatory Visit: Payer: Self-pay | Admitting: Family Medicine

## 2020-03-21 ENCOUNTER — Encounter: Payer: Self-pay | Admitting: Internal Medicine

## 2020-03-21 MED ORDER — ONETOUCH VERIO VI STRP: ORAL_STRIP | 2 refills | Status: DC

## 2020-03-23 NOTE — Telephone Encounter (Signed)
Pt contacted and he stated that he was able to pick up the rx for Lovaza.   Pt would like for [place] to do a PA for the LOVAZA at our earliest convenience.

## 2020-03-27 NOTE — Telephone Encounter (Signed)
Key: [id number]

## 2020-04-04 ENCOUNTER — Encounter: Payer: Self-pay | Admitting: Internal Medicine

## 2020-04-04 MED ORDER — FAMOTIDINE 40 MG PO TABS: 40.0000 mg | ORAL_TABLET | Freq: Every day | ORAL | 0 refills | Status: DC

## 2020-04-05 ENCOUNTER — Other Ambulatory Visit: Payer: Self-pay | Admitting: Internal Medicine

## 2020-04-17 ENCOUNTER — Ambulatory Visit: Payer: 59 | Admitting: Internal Medicine

## 2020-04-25 ENCOUNTER — Other Ambulatory Visit: Payer: Self-pay | Admitting: Internal Medicine

## 2020-04-25 ENCOUNTER — Encounter: Payer: Self-pay | Admitting: Internal Medicine

## 2020-04-25 DIAGNOSIS — N182 Chronic kidney disease, stage 2 (mild): Secondary | ICD-10-CM

## 2020-04-25 DIAGNOSIS — E1122 Type 2 diabetes mellitus with diabetic chronic kidney disease: Secondary | ICD-10-CM

## 2020-04-25 MED ORDER — ONETOUCH VERIO VI STRP: 1.0000 | ORAL_STRIP | Freq: Two times a day (BID) | 2 refills | Status: DC

## 2020-04-27 ENCOUNTER — Ambulatory Visit: Payer: 59 | Admitting: Family Medicine

## 2020-05-01 ENCOUNTER — Ambulatory Visit (INDEPENDENT_AMBULATORY_CARE_PROVIDER_SITE_OTHER): Payer: 59 | Admitting: Internal Medicine

## 2020-05-01 ENCOUNTER — Encounter: Payer: 59 | Admitting: Family Medicine

## 2020-05-01 ENCOUNTER — Other Ambulatory Visit: Payer: Self-pay

## 2020-05-01 ENCOUNTER — Encounter: Payer: Self-pay | Admitting: Internal Medicine

## 2020-05-01 VITALS — BP 126/84 | HR 84 | Temp 98.2°F | Ht 71.0 in | Wt 232.0 lb

## 2020-05-01 DIAGNOSIS — E1122 Type 2 diabetes mellitus with diabetic chronic kidney disease: Secondary | ICD-10-CM

## 2020-05-01 DIAGNOSIS — I10 Essential (primary) hypertension: Secondary | ICD-10-CM

## 2020-05-01 DIAGNOSIS — Z0000 Encounter for general adult medical examination without abnormal findings: Secondary | ICD-10-CM

## 2020-05-01 DIAGNOSIS — N182 Chronic kidney disease, stage 2 (mild): Secondary | ICD-10-CM | POA: Diagnosis not present

## 2020-05-01 LAB — PSA: PSA: 0.33 ng/mL (ref 0.10–4.00)

## 2020-05-01 NOTE — Patient Instructions (Signed)
Type 2 Diabetes Mellitus, Diagnosis, Adult Type 2 diabetes (type 2 diabetes mellitus) is a long-term, or chronic, disease. In type 2 diabetes, one or both of these problems may be present:  The pancreas does not make enough of a hormone called insulin.  Cells in the body do not respond properly to insulin that the body makes (insulin resistance). Normally, insulin allows blood sugar (glucose) to enter cells in the body. The cells use glucose for energy. Insulin resistance or lack of insulin causes excess glucose to build up in the blood instead of going into cells. This causes high blood glucose (hyperglycemia).  What are the causes? The exact cause of type 2 diabetes is not known. What increases the risk? The following factors may make you more likely to develop this condition:  Having a family member with type 2 diabetes.  Being overweight or obese.  Being inactive (sedentary).  Having been diagnosed with insulin resistance.  Having a history of prediabetes, diabetes when you were pregnant (gestational diabetes), or polycystic ovary syndrome (PCOS). What are the signs or symptoms? In the early stage of this condition, you may not have symptoms. Symptoms develop slowly and may include:  Increased thirst or hunger.  Increased urination.  Unexplained weight loss.  Tiredness (fatigue) or weakness.  Vision changes, such as blurry vision.  Dark patches on the skin. How is this diagnosed? This condition is diagnosed based on your symptoms, your medical history, a physical exam, and your blood glucose level. Your blood glucose may be checked with one or more of the following blood tests:  A fasting blood glucose (FBG) test. You will not be allowed to eat (you will fast) for 8 hours or longer before a blood sample is taken.  A random blood glucose test. This test checks blood glucose at any time of day regardless of when you ate.  An A1C (hemoglobin A1C) blood test. This test  provides information about blood glucose levels over the previous 2-3 months.  An oral glucose tolerance test (OGTT). This test measures your blood glucose at two times: ? After fasting. This is your baseline blood glucose level. ? Two hours after drinking a beverage that contains glucose. You may be diagnosed with type 2 diabetes if:  Your fasting blood glucose level is 126 mg/dL (7.0 mmol/L) or higher.  Your random blood glucose level is 200 mg/dL (11.1 mmol/L) or higher.  Your A1C level is 6.5% or higher.  Your oral glucose tolerance test result is higher than 200 mg/dL (11.1 mmol/L). These blood tests may be repeated to confirm your diagnosis.   How is this treated? Your treatment may be managed by a specialist called an endocrinologist. Type 2 diabetes may be treated by following instructions from your health care provider about:  Making dietary and lifestyle changes. These may include: ? Following a personalized nutrition plan that is developed by a registered dietitian. ? Exercising regularly. ? Finding ways to manage stress.  Checking your blood glucose level as often as told.  Taking diabetes medicines or insulin daily. This helps to keep your blood glucose levels in the healthy range.  Taking medicines to help prevent complications from diabetes. Medicines may include: ? Aspirin. ? Medicine to lower cholesterol. ? Medicine to control blood pressure. Your health care provider will set treatment goals for you. Your goals will be based on your age, other medical conditions you have, and how you respond to diabetes treatment. Generally, the goal of treatment is to maintain the   following blood glucose levels:  Before meals: 80-130 mg/dL (4.4-7.2 mmol/L).  After meals: below 180 mg/dL (10 mmol/L).  A1C level: less than 7%. Follow these instructions at home: Questions to ask your health care provider Consider asking the following questions:  Should I meet with a certified  diabetes care and education specialist?  What diabetes medicines do I need, and when should I take them?  What equipment will I need to manage my diabetes at home?  How often do I need to check my blood glucose?  Where can I find a support group for people with diabetes?  What number can I call if I have questions?  When is my next appointment? General instructions  Take over-the-counter and prescription medicines only as told by your health care provider.  Keep all follow-up visits as told by your health care provider. This is important. Where to find more information  American Diabetes Association (ADA): www.diabetes.org  American Association of Diabetes Care and Education Specialists (ADCES): www.diabeteseducator.org  International Diabetes Federation (IDF): www.idf.org Contact a health care provider if:  Your blood glucose is at or above 240 mg/dL (13.3 mmol/L) for 2 days in a row.  You have been sick or have had a fever for 2 days or longer, and you are not getting better.  You have any of the following problems for more than 6 hours: ? You cannot eat or drink. ? You have nausea and vomiting. ? You have diarrhea. Get help right away if:  You have severe hypoglycemia. This means your blood glucose is lower than 54 mg/dL (3.0 mmol/L).  You become confused or you have trouble thinking clearly.  You have difficulty breathing.  You have moderate or large ketone levels in your urine. These symptoms may represent a serious problem that is an emergency. Do not wait to see if the symptoms will go away. Get medical help right away. Call your local emergency services (911 in the [place] Do not drive yourself to the hospital. Summary  Type 2 diabetes (type 2 diabetes mellitus) is a long-term, or chronic, disease. In type 2 diabetes, the pancreas does not make enough of a hormone called insulin, or cells in the body do not respond properly to insulin that the body makes (insulin  resistance).  This condition is treated by making dietary and lifestyle changes and taking diabetes medicines or insulin.  Your health care provider will set treatment goals for you. Your goals will be based on your age, other medical conditions you have, and how you respond to diabetes treatment.  Keep all follow-up visits as told by your health care provider. This is important. This information is not intended to replace advice given to you by your health care provider. Make sure you discuss any questions you have with your health care provider. Document Revised: 09/15/2019 Document Reviewed: 09/15/2019 Elsevier Patient Education  2021 Elsevier Inc.  

## 2020-05-01 NOTE — Progress Notes (Signed)
Subjective:  Patient ID: Shawn Ban., male    DOB: [birth year]  Age: 60 y.o. MRN: [id number]  CC: Hypertension and Diabetes  This visit occurred during the SARS-CoV-2 public health emergency.  Safety protocols were in place, including screening questions prior to the visit, additional usage of staff PPE, and extensive cleaning of exam room while observing appropriate contact time as indicated for disinfecting solutions.    HPI Shawn Ball. presents for f/up - His blood sugar has been well controlled. He is active and denies CP/DOE/edema/fatigue.  Outpatient Medications Prior to Visit  Medication Sig Dispense Refill  . aspirin EC 81 MG tablet Take 81 mg by mouth daily.    . Bilberry, Vaccinium myrtillus, (BILBERRY EXTRACT PO) Take by mouth.    . carvedilol (COREG) 12.5 MG tablet TAKE 1 TABLET BY MOUTH TWICE DAILY WITH A MEAL 180 tablet 3  . cetirizine (ZYRTEC) 10 MG tablet Take 1 tablet (10 mg total) by mouth daily. 90 tablet 3  . fluticasone (FLONASE) 50 MCG/ACT nasal spray Place 2 sprays into both nostrils daily. 16 g 5  . glucose blood (ONETOUCH VERIO) test strip 1 each by Other route in the morning and at bedtime. DX: E11.22 100 each 2  . Lancets (FREESTYLE) lancets Use as instructed 100 each 12  . losartan (COZAAR) 100 MG tablet Take 1 tablet (100 mg total) by mouth daily. 90 tablet 3  . metFORMIN (GLUCOPHAGE) 500 MG tablet Take 1 tablet (500 mg total) by mouth 2 (two) times daily with a meal. 180 tablet 1  . Multiple Vitamin (MULITIVITAMIN WITH MINERALS) TABS Take 1 tablet by mouth daily.    Marland Kitchen omega-3 acid ethyl esters (LOVAZA) 1 g capsule Take 2 capsules (2 g total) by mouth 2 (two) times daily. 360 capsule 1  . omeprazole (PRILOSEC) 20 MG capsule Take 1 capsule (20 mg total) by mouth 2 (two) times daily before a meal. 180 capsule 3  . pravastatin (PRAVACHOL) 20 MG tablet Take 1 tablet (20 mg total) by mouth daily. 90 tablet 1  . Probiotic Product (PROBIOTIC PO)  Take by mouth.    . famotidine (PEPCID) 40 MG tablet Take 1 tablet (40 mg total) by mouth daily. 30 tablet 0   No facility-administered medications prior to visit.    ROS Review of Systems  Constitutional: Negative for diaphoresis and fatigue.  HENT: Negative.   Eyes: Negative for visual disturbance.  Respiratory: Negative for cough, chest tightness, shortness of breath and wheezing.   Cardiovascular: Negative for chest pain, palpitations and leg swelling.  Gastrointestinal: Negative for abdominal pain, diarrhea, nausea and vomiting.  Endocrine: Negative.  Negative for polydipsia, polyphagia and polyuria.  Genitourinary: Negative.  Negative for difficulty urinating.  Musculoskeletal: Positive for arthralgias. Negative for myalgias.  Skin: Negative.   Neurological: Negative.  Negative for dizziness, facial asymmetry and weakness.  Hematological: Negative for adenopathy. Does not bruise/bleed easily.  Psychiatric/Behavioral: Negative.     Objective:  BP 126/84   Pulse 84   Temp 98.2 F (36.8 C) (Oral)   Ht 5\' 11"  (1.803 m)   Wt 232 lb (105.2 kg)   SpO2 96%   BMI 32.36 kg/m   BP Readings from Last 3 Encounters:  05/01/20 126/84  03/15/20 128/88  02/27/20 (!) 138/97    Wt Readings from Last 3 Encounters:  05/01/20 232 lb (105.2 kg)  03/15/20 230 lb (104.3 kg)  02/24/20 235 lb (106.6 kg)    Physical Exam Vitals reviewed.  HENT:     Nose: Nose normal.     Mouth/Throat:     Mouth: Mucous membranes are moist.  Eyes:     General: No scleral icterus.    Conjunctiva/sclera: Conjunctivae normal.  Cardiovascular:     Rate and Rhythm: Normal rate and regular rhythm.     Heart sounds: No murmur heard.   Pulmonary:     Effort: Pulmonary effort is normal.     Breath sounds: No stridor. No wheezing, rhonchi or rales.  Abdominal:     General: Abdomen is flat. Bowel sounds are normal. There is no distension.     Palpations: Abdomen is soft. There is no hepatomegaly,  splenomegaly or mass.  Musculoskeletal:        General: Normal range of motion.     Cervical back: Neck supple.  Lymphadenopathy:     Cervical: No cervical adenopathy.  Skin:    General: Skin is warm and dry.  Neurological:     General: No focal deficit present.     Mental Status: He is alert.  Psychiatric:        Mood and Affect: Mood normal.        Behavior: Behavior normal.     Lab Results  Component Value Date   WBC 5.4 03/13/2020   HGB 14.9 03/13/2020   HCT 43 03/13/2020   PLT 270 03/13/2020   GLUCOSE 117 (H) 02/01/2020   CHOL 196 02/01/2020   TRIG 215 (H) 02/01/2020   HDL 28 (L) 02/01/2020   LDLCALC 132 (H) 02/01/2020   ALT 31 03/13/2020   AST 20 03/13/2020   NA 140 03/13/2020   K 4.9 03/13/2020   CL 104 03/13/2020   CREATININE 1.2 03/13/2020   BUN 15 03/13/2020   CO2 26 (A) 03/13/2020   PSA 0.33 05/01/2020   HGBA1C 6.4 (A) 03/15/2020   MICROALBUR 40.9 03/13/2020    No results found.  Assessment & Plan:   Shawn Ball was seen today for hypertension and diabetes.  Diagnoses and all orders for this visit:  Benign essential HTN- His BP is well controlled.  Type 2 diabetes mellitus with stage 2 chronic kidney disease, without long-term current use of insulin (HCC)- His blood sugar is well controlled. Will continue the current metformin dose.  Routine general medical examination at a health care facility -     PSA; Future -     PSA   I have discontinued Shawn Ball. Shawn Ball. "Shawn Ball"'s famotidine. I am also having him maintain his aspirin EC, multivitamin with minerals, fluticasone, (Bilberry, Vaccinium myrtillus, (BILBERRY EXTRACT PO)), Probiotic Product (PROBIOTIC PO), losartan, omeprazole, carvedilol, cetirizine, freestyle, pravastatin, metFORMIN, omega-3 acid ethyl esters, and OneTouch Verio.  No orders of the defined types were placed in this encounter.    Follow-up: Return in about 4 months (around 08/29/2020).  Sanda Linger, MD

## 2020-05-03 ENCOUNTER — Encounter: Payer: Self-pay | Admitting: Internal Medicine

## 2020-05-03 ENCOUNTER — Other Ambulatory Visit: Payer: Self-pay | Admitting: Internal Medicine

## 2020-05-03 DIAGNOSIS — E781 Pure hyperglyceridemia: Secondary | ICD-10-CM

## 2020-05-03 DIAGNOSIS — E785 Hyperlipidemia, unspecified: Secondary | ICD-10-CM

## 2020-05-11 ENCOUNTER — Ambulatory Visit: Payer: 59 | Admitting: Cardiovascular Disease

## 2020-05-11 NOTE — Progress Notes (Signed)
Cardiology Office Note:   Date:  05/12/2020  NAME:  Shawn Ball.    MRN: [id number] DOB:  03/26/1960   PCP:  Etta Grandchild, MD  Cardiologist:  No primary care provider on file.   Referring MD: Donita Brooks, MD   Chief Complaint  Patient presents with  . Follow-up   History of Present Illness:   Shawn Ball. is a 60 y.o. male with a hx of DM, HTN, HLD who presents for follow-up. Seen for atypical CP. Normal stress test. Did not tolerate zetia. Placed on pravastatin and fish oil by PCP.  We discussed results of the stress test.  This was a normal stress test on my review.  He continues to report atypical chest pain.  Worse with palpation.  He has had costochondritis for years.  This comes and goes.  Symptoms are stable.  He was not able to tolerate Nexletol, Zetia or Vascepa.  He has seen a new primary care doctor who has started him on pravastatin.  He is also on fish oils.  Apparently tolerating these fairly well.  He reports no real chest pain.  He had left arm pain yesterday but this was atypical and occurred all day.  His symptoms appear to be stable.  Blood pressure 110/70 in office.  BMI 32.  We discussed getting more active.  I recommended 150 minutes/week of exercise.  His echocardiogram was normal.  This is reassuring.  His most recent A1c is 6.4.  He just had labs drawn today.  We will follow up the results of his lipid profile.  Problem List 1. Diabetes -A1c 6.5 2. HLD -statin intolerance  -T chol 196, HDL 28, LDL 132, TG 215 3. HTN 4. Obesity 5. CKD  Past Medical History: Past Medical History:  Diagnosis Date  . Allergy   . Arthritis   . Chronic bronchitis   . Chronic pain    per pt/ right shoulder pain, left leg and ankle  . CKD (chronic kidney disease) stage 3, GFR 30-59 ml/min (HCC)   . Costochondral chest pain    due to torn tendons.  . CTS (carpal tunnel syndrome)    right hand  . Diabetes mellitus without complication (HCC)     prediabetes/ no meds  . Diverticulitis   . GERD (gastroesophageal reflux disease)   . History of anal fissures   . Hyperlipemia   . Hypertension   . Rectal bleeding    occasional  . Seasonal allergies     Past Surgical History: Past Surgical History:  Procedure Laterality Date  . ANKLE FRACTURE SURGERY  2005 / 2007   left ankle and  left leg/ have screws in leg and ankle  . CARPAL TUNNEL RELEASE Left 07/29/2019   Procedure: CARPAL TUNNEL RELEASE;  Surgeon: Cindee Salt, MD;  Location: [facility];  Service: Orthopedics;  Laterality: Left;  FOREARM BLOCK  . EXAMINATION UNDER ANESTHESIA N/A 12/15/2012   Procedure: EXAM UNDER ANESTHESIA;  Surgeon: Atilano Ina, MD;  Location: [facility];  Service: General;  Laterality: N/A;  . FRACTURE SURGERY  2005 and 2007   lt ankle x2  . HEMORRHOIDECTOMY WITH HEMORRHOID BANDING N/A 12/15/2012   Procedure: EXCISIONAL HEMORRHOIDECTOMY WITH HEMORRHOID BANDING;  Surgeon: Atilano Ina, MD;  Location: [facility];  Service: General;  Laterality: N/A;  . rotator cuff surgery      x 2/ right shoulder  . TRIGGER FINGER RELEASE  07/23/2011  Procedure: Right hand- RELEASE TRIGGER FINGER/A-1 PULLEY;  Surgeon: Wyn Forster., MD;  Location: [facility];  Service: Orthopedics;  Laterality: Right;  . WRIST FRACTURE SURGERY  1984   lt with bone graft  . WRIST SURGERY     left wrist    Current Medications: Current Meds  Medication Sig  . aspirin EC 81 MG tablet Take 81 mg by mouth daily.  . Bilberry, Vaccinium myrtillus, (BILBERRY EXTRACT PO) Take by mouth.  . carvedilol (COREG) 12.5 MG tablet TAKE 1 TABLET BY MOUTH TWICE DAILY WITH A MEAL  . cetirizine (ZYRTEC) 10 MG tablet Take 1 tablet (10 mg total) by mouth daily.  . fluticasone (FLONASE) 50 MCG/ACT nasal spray Place 2 sprays into both nostrils daily.  Marland Kitchen glucose blood (ONETOUCH VERIO) test strip 1 each by Other route in the morning and at  bedtime. DX: E11.22  . Lancets (FREESTYLE) lancets Use as instructed  . losartan (COZAAR) 100 MG tablet Take 1 tablet (100 mg total) by mouth daily.  . metFORMIN (GLUCOPHAGE) 500 MG tablet Take 1 tablet (500 mg total) by mouth 2 (two) times daily with a meal. (Patient taking differently: Take 500 mg by mouth 2 (two) times daily with a meal. Pt takes as needed)  . Multiple Vitamin (MULITIVITAMIN WITH MINERALS) TABS Take 1 tablet by mouth daily.  . NON FORMULARY Take 500 mg by mouth daily. Mango Steen  . omega-3 acid ethyl esters (LOVAZA) 1 g capsule Take 2 capsules (2 g total) by mouth 2 (two) times daily.  Marland Kitchen omeprazole (PRILOSEC) 20 MG capsule Take 1 capsule (20 mg total) by mouth 2 (two) times daily before a meal.  . pravastatin (PRAVACHOL) 20 MG tablet Take 1 tablet (20 mg total) by mouth daily.  . Probiotic Product (PROBIOTIC PO) Take by mouth.     Allergies:    Amlodipine, Codeine, Ivp dye [iodinated diagnostic agents], Penicillins, and Sulfa antibiotics   Social History: Social History   Socioeconomic History  . Marital status: Divorced    Spouse name: Not on file  . Number of children: 1  . Years of education: Not on file  . Highest education level: Not on file  Occupational History  . Occupation: disabled  Tobacco Use  . Smoking status: Never Smoker  . Smokeless tobacco: Never Used  Vaping Use  . Vaping Use: Never used  Substance and Sexual Activity  . Alcohol use: No  . Drug use: No  . Sexual activity: Not on file  Other Topics Concern  . Not on file  Social History Narrative  . Not on file   Social Determinants of Health   Financial Resource Strain: Not on file  Food Insecurity: Not on file  Transportation Needs: Not on file  Physical Activity: Not on file  Stress: Not on file  Social Connections: Not on file     Family History: The patient's family history includes Cancer in his father; Diabetes in his brother and mother; Heart disease in his mother;  Hyperlipidemia in his mother; Macular degeneration in his mother and sister. There is no history of Colon cancer, Stomach cancer, Esophageal cancer, or Rectal cancer.  ROS:   All other ROS reviewed and negative. Pertinent positives noted in the HPI.     EKGs/Labs/Other Studies Reviewed:   The following studies were personally reviewed by me today:  NM Stress 02/24/2020  Nuclear stress EF: 55%.  The left ventricular ejection fraction is normal (55-65%).  There was no ST  segment deviation noted during stress.  There is a small, reversible defect of mild severity present in the basal inferoseptal and mid inferoseptal location.  Findings consistent with ischemia.  This is a low risk study.  TTE 02/24/2020 1. Left ventricular ejection fraction, by estimation, is 60 to 65%. The  left ventricle has normal function. The left ventricle has no regional  wall motion abnormalities. This is well-delineated with Definity contrast.  Left ventricular diastolic  parameters are consistent with Grade I diastolic dysfunction (impaired  relaxation). The average left ventricular global longitudinal strain is  -20.1 %. The global longitudinal strain is normal.  2. Right ventricular systolic function is normal. The right ventricular  size is normal.  3. The mitral valve is normal in structure. No evidence of mitral valve  regurgitation.  4. The aortic valve is tricuspid. Aortic valve regurgitation is not  visualized.    Recent Labs: 03/13/2020: ALT 31; BUN 15; Creatinine 1.2; Hemoglobin 14.9; Platelets 270; Potassium 4.9; Sodium 140   Recent Lipid Panel    Component Value Date/Time   CHOL 196 02/01/2020 0902   TRIG 215 (H) 02/01/2020 0902   HDL 28 (L) 02/01/2020 0902   CHOLHDL 7.0 (H) 02/01/2020 0902   VLDL 32 (H) 10/07/2016 0922   LDLCALC 132 (H) 02/01/2020 0902    Physical Exam:   VS:  BP 110/70 (BP Location: Left Arm, Patient Position: Sitting)   Pulse 85   Ht 5\' 11"  (1.803 m)    Wt 233 lb 12.8 oz (106.1 kg)   SpO2 96%   BMI 32.61 kg/m    Wt Readings from Last 3 Encounters:  05/12/20 233 lb 12.8 oz (106.1 kg)  05/01/20 232 lb (105.2 kg)  03/15/20 230 lb (104.3 kg)    General: Well nourished, well developed, in no acute distress Head: Atraumatic, normal size  Eyes: PEERLA, EOMI  Neck: Supple, no JVD Endocrine: No thryomegaly Cardiac: Normal S1, S2; RRR; no murmurs, rubs, or gallops Lungs: Clear to auscultation bilaterally, no wheezing, rhonchi or rales  Abd: Soft, nontender, no hepatomegaly  Ext: No edema, pulses 2+ Musculoskeletal: No deformities, BUE and BLE strength normal and equal Skin: Warm and dry, no rashes   Neuro: Alert and oriented to person, place, time, and situation, CNII-XII grossly intact, no focal deficits  Psych: Normal mood and affect   ASSESSMENT:   Jaja Switalski. is a 60 y.o. male who presents for the following: 1. SOB (shortness of breath) on exertion   2. Mixed hyperlipidemia   3. Primary hypertension     PLAN:   1. SOB (shortness of breath) on exertion -Exertional shortness of breath.  Atypical chest pain.  Recent stress test was normal on my review.  Echocardiogram normal.  No evidence of heart failure.  I suspect his symptoms are related to deconditioning.  I recommended regular exercise.  His blood pressure is well controlled.  2. Mixed hyperlipidemia -He is diabetic with an A1c of 6.4.  Most recent LDL cholesterol 132.  Triglycerides 215.  He has not been able to tolerate most statins, Zetia, Nexletol, Vascepa.  Started on pravastatin and fish oils by his primary care physician.  Tolerating these well.  Repeat labs are pending.  Hopefully he can get his LDL cholesterol less than 70 and triglycerides less than 150.  His next option would be Repatha.  3. Primary hypertension -BP well controlled.  No change in medications.  Disposition: Return in about 1 year (around 05/12/2021).  Medication Adjustments/Labs and  Tests  Ordered: Current medicines are reviewed at length with the patient today.  Concerns regarding medicines are outlined above.  No orders of the defined types were placed in this encounter.  No orders of the defined types were placed in this encounter.   Patient Instructions  Medication Instructions:  The current medical regimen is effective;  continue present plan and medications.  *If you need a refill on your cardiac medications before your next appointment, please call your pharmacy*   Follow-Up: [facility], you and your health needs are our priority.  As part of our continuing mission to provide you with exceptional heart care, we have created designated Provider Care Teams.  These Care Teams include your primary Cardiologist (physician) and Advanced Practice Providers (APPs -  Physician Assistants and Nurse Practitioners) who all work together to provide you with the care you need, when you need it.  We recommend signing up for the patient portal called "MyChart".  Sign up information is provided on this After Visit Summary.  MyChart is used to connect with patients for Virtual Visits (Telemedicine).  Patients are able to view lab/test results, encounter notes, upcoming appointments, etc.  Non-urgent messages can be sent to your provider as well.   To learn more about what you can do with MyChart, go to [email or web address].    Your next appointment:   12 month(s)  The format for your next appointment:   In Person  Provider:   Lennie OdorWesley O'Neal, MD        Time Spent with Patient: I have spent a total of 25 minutes with patient reviewing hospital notes, telemetry, EKGs, labs and examining the patient as well as establishing an assessment and plan that was discussed with the patient.  > 50% of time was spent in direct patient care.  Signed, Lenna GilfordWesley T. Flora Lipps'Neal, MD, [facility]ACC [place]  [facility]  [address], [address] [place], [place] [id number] [phone]   05/12/2020 11:03 AM

## 2020-05-12 ENCOUNTER — Encounter: Payer: Self-pay | Admitting: Internal Medicine

## 2020-05-12 ENCOUNTER — Other Ambulatory Visit (INDEPENDENT_AMBULATORY_CARE_PROVIDER_SITE_OTHER): Payer: 59

## 2020-05-12 ENCOUNTER — Encounter: Payer: Self-pay | Admitting: Cardiovascular Disease

## 2020-05-12 ENCOUNTER — Other Ambulatory Visit: Payer: Self-pay

## 2020-05-12 ENCOUNTER — Ambulatory Visit: Payer: 59 | Admitting: Cardiovascular Disease

## 2020-05-12 VITALS — BP 110/70 | HR 85 | Ht 71.0 in | Wt 233.8 lb

## 2020-05-12 DIAGNOSIS — I10 Essential (primary) hypertension: Secondary | ICD-10-CM | POA: Diagnosis not present

## 2020-05-12 DIAGNOSIS — R0602 Shortness of breath: Secondary | ICD-10-CM | POA: Diagnosis not present

## 2020-05-12 DIAGNOSIS — E782 Mixed hyperlipidemia: Secondary | ICD-10-CM

## 2020-05-12 DIAGNOSIS — E785 Hyperlipidemia, unspecified: Secondary | ICD-10-CM | POA: Diagnosis not present

## 2020-05-12 DIAGNOSIS — E781 Pure hyperglyceridemia: Secondary | ICD-10-CM | POA: Diagnosis not present

## 2020-05-12 LAB — LIPID PANEL
Cholesterol: 136 mg/dL (ref 0–200)
HDL: 27.3 mg/dL — ABNORMAL LOW (ref >39.00)
LDL Cholesterol: 72 mg/dL (ref 0–99)
NonHDL: 108.96
Total CHOL/HDL Ratio: 5
Triglycerides: 185 mg/dL — ABNORMAL HIGH (ref 0.0–149.0)
VLDL: 37 mg/dL (ref 0.0–40.0)

## 2020-05-12 LAB — HEPATIC FUNCTION PANEL
ALT: 34 U/L (ref 0–53)
AST: 23 U/L (ref 0–37)
Albumin: 4.1 g/dL (ref 3.5–5.2)
Alkaline Phosphatase: 64 U/L (ref 39–117)
Bilirubin, Direct: 0.1 mg/dL (ref 0.0–0.3)
Total Bilirubin: 0.6 mg/dL (ref 0.2–1.2)
Total Protein: 7.2 g/dL (ref 6.0–8.3)

## 2020-05-12 LAB — CK: Total CK: 72 U/L (ref 7–232)

## 2020-05-12 NOTE — Patient Instructions (Signed)

## 2020-05-15 ENCOUNTER — Other Ambulatory Visit: Payer: Self-pay | Admitting: Internal Medicine

## 2020-05-26 ENCOUNTER — Telehealth: Payer: Self-pay | Admitting: Internal Medicine

## 2020-05-26 ENCOUNTER — Encounter: Payer: Self-pay | Admitting: Internal Medicine

## 2020-05-26 DIAGNOSIS — R8290 Unspecified abnormal findings in urine: Secondary | ICD-10-CM

## 2020-05-26 NOTE — Addendum Note (Signed)
Addended by: Hillard Danker A on: 05/26/2020 12:56 PM   Modules accepted: Orders

## 2020-05-26 NOTE — Telephone Encounter (Signed)
Order placed

## 2020-05-26 NOTE — Telephone Encounter (Signed)
Patient called and is requesting an order for a UA. He said that his urine has an odor. He can be reached at [phone]. Please advise

## 2020-05-29 NOTE — Telephone Encounter (Signed)
Called pt, LVM.   Order was placed. MyChart message was received in regard. PCP will be responding to additional questions/concerns.

## 2020-05-30 NOTE — Telephone Encounter (Signed)
Pt states he got a message from [employer] has not had time to call back.  Pt states since Friday he has stopped taking his statin & Metformin medication & has noted that the foul odor has subsided & decreased abdominal pain.  Pt felt that the odor was due to proteinuria as he has had these symptoms before that were r/t other statin drugs in the past. Pt all symptoms were gone by Monday.  He also states he has started taking Berberine & would like to continue taking the supplement for 45 days & make an appt with PCP. Pt instructed to give urine sample as a baseline to determine if anything needs to be done. PCP made aware.

## 2020-07-10 LAB — HM DIABETES EYE EXAM

## 2020-08-07 ENCOUNTER — Telehealth: Payer: Self-pay | Admitting: Internal Medicine

## 2020-08-07 NOTE — Telephone Encounter (Signed)
Pt called in asking for a TOC from [place] to [place], please advise if you are both ok with this.

## 2020-08-07 NOTE — Telephone Encounter (Signed)
Yes ok with me

## 2020-08-07 NOTE — Telephone Encounter (Signed)
Yes, I am ok with this 

## 2020-08-07 NOTE — Telephone Encounter (Signed)
Are you ok with this? 

## 2020-08-08 NOTE — Telephone Encounter (Signed)
Pt has been scheduled.  °

## 2020-08-25 ENCOUNTER — Other Ambulatory Visit: Payer: Self-pay

## 2020-08-28 ENCOUNTER — Encounter: Payer: Self-pay | Admitting: Internal Medicine

## 2020-08-28 ENCOUNTER — Ambulatory Visit: Payer: 59 | Admitting: Internal Medicine

## 2020-08-28 ENCOUNTER — Other Ambulatory Visit: Payer: Self-pay

## 2020-08-28 VITALS — BP 142/92 | HR 77 | Temp 98.4°F | Resp 16 | Ht 71.0 in | Wt 223.0 lb

## 2020-08-28 DIAGNOSIS — E785 Hyperlipidemia, unspecified: Secondary | ICD-10-CM | POA: Diagnosis not present

## 2020-08-28 DIAGNOSIS — E781 Pure hyperglyceridemia: Secondary | ICD-10-CM

## 2020-08-28 DIAGNOSIS — E119 Type 2 diabetes mellitus without complications: Secondary | ICD-10-CM

## 2020-08-28 DIAGNOSIS — I10 Essential (primary) hypertension: Secondary | ICD-10-CM | POA: Diagnosis not present

## 2020-08-28 DIAGNOSIS — B309 Viral conjunctivitis, unspecified: Secondary | ICD-10-CM

## 2020-08-28 LAB — BASIC METABOLIC PANEL
BUN: 14 mg/dL (ref 6–23)
CO2: 28 mEq/L (ref 19–32)
Calcium: 10 mg/dL (ref 8.4–10.5)
Chloride: 103 mEq/L (ref 96–112)
Creatinine, Ser: 1.12 mg/dL (ref 0.40–1.50)
GFR: 72.37 mL/min (ref >60.00)
Glucose, Bld: 147 mg/dL — ABNORMAL HIGH (ref 70–99)
Potassium: 5 mEq/L (ref 3.5–5.1)
Sodium: 139 mEq/L (ref 135–145)

## 2020-08-28 LAB — LIPID PANEL
Cholesterol: 209 mg/dL — ABNORMAL HIGH (ref 0–200)
HDL: 28.4 mg/dL — ABNORMAL LOW (ref >39.00)
NonHDL: 180.5
Total CHOL/HDL Ratio: 7
Triglycerides: 214 mg/dL — ABNORMAL HIGH (ref 0.0–149.0)
VLDL: 42.8 mg/dL — ABNORMAL HIGH (ref 0.0–40.0)

## 2020-08-28 LAB — HEMOGLOBIN A1C: Hgb A1c MFr Bld: 7.3 % — ABNORMAL HIGH (ref 4.6–6.5)

## 2020-08-28 LAB — TSH: TSH: 3.52 u[IU]/mL (ref 0.35–4.50)

## 2020-08-28 LAB — LDL CHOLESTEROL, DIRECT: Direct LDL: 135 mg/dL

## 2020-08-28 MED ORDER — EMPAGLIFLOZIN 25 MG PO TABS: 25.0000 mg | ORAL_TABLET | Freq: Every day | ORAL | 1 refills | Status: DC

## 2020-08-28 MED ORDER — NEOMYCIN-POLYMYXIN-DEXAMETH 3.5-10000-0.1 OP SUSP: 2.0000 [drp] | Freq: Four times a day (QID) | OPHTHALMIC | 0 refills | Status: DC

## 2020-08-28 NOTE — Patient Instructions (Signed)
Viral Conjunctivitis, Adult ? ?Viral conjunctivitis is an inflammation of the clear membrane that covers the white part of the eye and the inner surface of the eyelid (conjunctiva). The inflammation is caused by a viral infection. The blood vessels in the conjunctiva become enlarged, causing the eye to become red or pink and often itchy. It usually starts in one eye and goes to the other in a day or two. Infections often resolve over 1-2 weeks. Viral conjunctivitis is contagious. This means it can be easily passed from one person to another. This condition is often called pink eye. ?What are the causes? ?This condition is caused by a virus. It can be spread by touching objects that have been contaminated with the virus, such as doorknobs or towels, and then touching your eye. It can also be passed through tiny droplets, such as from coughing or sneezing. ?What increases the risk? ?You are more likely to develop this condition if you have a cold or the flu, or are in close contact with a person with pink eye. ?What are the signs or symptoms? ?Symptoms of this condition include: ?Redness in the eye. ?Tearing or watery eyes. ?Itchy and irritated eyes. ?Burning feeling in the eyes. ?Clear drainage from the eye. ?Swollen eyelids. ?A gritty feeling in the eye. ?Light sensitivity. ?This condition often occurs with other symptoms, such as nasal congestion, cough, and fever. ?How is this diagnosed? ?This condition is diagnosed with a medical history and physical exam. If you have discharge from your eye, the discharge may be tested to rule out other causes of conjunctivitis. ?How is this treated? ?Viral conjunctivitis does not respond to medicines that kill bacteria (antibiotics). The condition most often resolves on its own in 1-2 weeks. If treatment is needed, it is aimed at relieving your symptoms and preventing the spread of infection. This may be done with artificial tear drops, antihistamine drops, or other eye  medicines. In rare cases, steroid eye drops or anti-herpes virus medicines may be prescribed. ?Follow these instructions at home: ?Medicines ? ?Take or apply over-the-counter and prescription medicines only as told by your health care provider. ?Do not touch the edge of the eyelid with the eye-drop bottle or ointment tube when applying medicines to the affected eye. This will prevent the spread of the infection to the other eye or to other people. ?Eye care ?Avoid touching or rubbing your eyes. ?Apply a clean, cool, wet washcloth onto your eye for 10-20 minutes, 3-4 times per day, or as told by your health care provider. ?If you wear contact lenses, do not wear them until the inflammation is gone and your health care provider says it is safe to wear them again. Ask your health care provider how to disinfect or replace your contact lenses before using them again. Wear glasses until you can resume wearing contacts. ?Avoid wearing eye makeup until the inflammation is gone. Throw away any old eye cosmetics that may be contaminated. ?Gently wipe away any crusting from your eye with a wet washcloth or a cotton ball. ?General instructions ?Change or wash your pillowcase every day or as told by your health care provider. ?Do not share towels, pillowcases, washcloths, eye makeup, makeup brushes, contact lenses, or eyeglasses. This may spread the infection. ?Wash your hands often with soap and water. Use paper towels to dry your hands. If soap and water are not available, use hand sanitizer. ?Avoid contact with other people until your eye is no longer red and tearing, or as told   by your health care provider. ?Contact a health care provider if: ?Your symptoms do not improve with treatment, or they get worse. ?You have increased pain. ?Your vision becomes blurry. ?You have a fever. ?You have facial pain, redness, or swelling. ?You have yellow or green drainage coming from your eye. ?You have new symptoms. ?Get help right away  if: ?You develop severe pain. ?Your vision gets much worse. ?Summary ?Viral conjunctivitis is an inflammation of the clear membrane that covers the white part of the eye and the inner surface of the eyelid. It usually goes away in 1-2 weeks. ?This condition is usually treated with medicines and cold compresses. Treatment focuses on relieving the symptoms. ?This condition is very contagious. To prevent infection, avoid close contact with others, wash your hands often, and do not share towels or washcloths. ?Contact a health care provider if your symptoms do not go away with treatment, or if you have more pain, poor vision, or swelling in the eyes. ?Get help right away if you have severe pain or your vision gets much worse. ?This information is not intended to replace advice given to you by your health care provider. Make sure you discuss any questions you have with your health care provider. ?Document Revised: 01/18/2019 Document Reviewed: 01/01/2019 ?Elsevier Patient Education ? 2022 [employer] ? ?

## 2020-08-28 NOTE — Progress Notes (Signed)
Subjective:  Patient ID: Shawn Ban., male    DOB: 11-Oct-1960  Age: 60 y.o. MRN: [id number]  CC: Hypertension and Diabetes  This visit occurred during the SARS-CoV-2 public health emergency.  Safety protocols were in place, including screening questions prior to the visit, additional usage of staff PPE, and extensive cleaning of exam room while observing appropriate contact time as indicated for disinfecting solutions.    HPI Shawn Ball. presents for f/up -  He is not taking metformin or pravastatin.  He is active and denies any recent episodes of chest pain, shortness of breath, dizziness, lightheadedness, or polys.  He complains of a 3-day history of red eye and foreign body sensation.  He denies photophobia, loss of vision, or discharge from the eye.  Outpatient Medications Prior to Visit  Medication Sig Dispense Refill   Barberry-Oreg Grape-Goldenseal (BERBERINE COMPLEX PO) Take 1,200 mg by mouth. 1200mg  of HCI and 200mg  of organic cinnamon bark     Bilberry, Vaccinium myrtillus, (BILBERRY EXTRACT PO) Take by mouth.     carvedilol (COREG) 12.5 MG tablet TAKE 1 TABLET BY MOUTH TWICE DAILY WITH A MEAL 180 tablet 3   cetirizine (ZYRTEC) 10 MG tablet Take 1 tablet (10 mg total) by mouth daily. 90 tablet 3   FIBER COMPLETE PO Take by mouth. Vita Fusion fiber gummies     fluticasone (FLONASE) 50 MCG/ACT nasal spray Place 2 sprays into both nostrils daily. 16 g 5   Garlic (GARLIQUE) 400 MG TBEC Take by mouth.     glucose blood (ONETOUCH VERIO) test strip 1 each by Other route in the morning and at bedtime. DX: E11.22 100 each 2   Lancets (FREESTYLE) lancets Use as instructed 100 each 12   losartan (COZAAR) 100 MG tablet Take 1 tablet (100 mg total) by mouth daily. 90 tablet 3   MAGNESIUM OXIDE PO Take 375 mg by mouth daily.     Multiple Vitamin (MULITIVITAMIN WITH MINERALS) TABS Take 1 tablet by mouth daily.     NON FORMULARY Take 500 mg by mouth daily. Mangosteen      omega-3 acid ethyl esters (LOVAZA) 1 g capsule Take 2 capsules (2 g total) by mouth 2 (two) times daily. 360 capsule 1   omeprazole (PRILOSEC) 20 MG capsule Take 1 capsule (20 mg total) by mouth 2 (two) times daily before a meal. 180 capsule 3   Probiotic Product (PROBIOTIC PO) Take by mouth.     aspirin EC 81 MG tablet Take 81 mg by mouth daily.     metFORMIN (GLUCOPHAGE) 500 MG tablet Take 1 tablet (500 mg total) by mouth 2 (two) times daily with a meal. (Patient taking differently: Take 500 mg by mouth 2 (two) times daily with a meal. Pt takes as needed) 180 tablet 1   pravastatin (PRAVACHOL) 20 MG tablet Take 1 tablet (20 mg total) by mouth daily. 90 tablet 1   No facility-administered medications prior to visit.    ROS Review of Systems  Constitutional:  Negative for diaphoresis and fatigue.  HENT: Negative.    Eyes:  Positive for pain, redness and itching. Negative for photophobia, discharge and visual disturbance.  Respiratory: Negative.  Negative for cough, chest tightness, shortness of breath and wheezing.   Cardiovascular:  Negative for chest pain, palpitations and leg swelling.  Gastrointestinal:  Negative for abdominal pain, constipation, diarrhea, nausea and vomiting.  Endocrine: Negative.   Genitourinary: Negative.  Negative for difficulty urinating.  Musculoskeletal: Negative.  Negative  for arthralgias and myalgias.  Skin: Negative.  Negative for color change and pallor.  Allergic/Immunologic: Negative.   Neurological: Negative.  Negative for dizziness and weakness.  Hematological:  Negative for adenopathy. Does not bruise/bleed easily.   Objective:  BP (!) 142/92 (BP Location: Right Arm, Patient Position: Sitting, Cuff Size: Large)   Pulse 77   Temp 98.4 F (36.9 C) (Oral)   Resp 16   Ht 5\' 11"  (1.803 m)   Wt 223 lb (101.2 kg)   SpO2 95%   BMI 31.10 kg/m   BP Readings from Last 3 Encounters:  08/28/20 (!) 142/92  05/12/20 110/70  05/01/20 126/84    Wt  Readings from Last 3 Encounters:  08/28/20 223 lb (101.2 kg)  05/12/20 233 lb 12.8 oz (106.1 kg)  05/01/20 232 lb (105.2 kg)    Physical Exam Vitals reviewed.  Constitutional:      Appearance: Normal appearance.  HENT:     Nose: Nose normal.     Mouth/Throat:     Mouth: Mucous membranes are moist.  Eyes:     General: Lids are normal. No scleral icterus.       Right eye: No foreign body, discharge or hordeolum.        Left eye: No foreign body, discharge or hordeolum.     Extraocular Movements: Extraocular movements intact.     Right eye: No nystagmus.     Left eye: No nystagmus.     Conjunctiva/sclera:     Right eye: Right conjunctiva is injected. No chemosis, exudate or hemorrhage.    Left eye: Left conjunctiva is not injected. No chemosis, exudate or hemorrhage.    Pupils: Pupils are equal, round, and reactive to light.      Comments: There is diffuse bulbar conjunctival injection.  It is more prominent on the right than the left.  Fluorescein stain in the right eye is negative for uptake on the corneal surface.  There is uptake over the injected areas of the conjunctival surface.  Cardiovascular:     Rate and Rhythm: Normal rate and regular rhythm.     Heart sounds: No murmur heard. Pulmonary:     Effort: Pulmonary effort is normal.     Breath sounds: No stridor. No wheezing, rhonchi or rales.  Abdominal:     General: Abdomen is flat. Bowel sounds are normal. There is no distension.     Palpations: Abdomen is soft. There is no hepatomegaly, splenomegaly or mass.     Tenderness: There is no abdominal tenderness.  Musculoskeletal:        General: Normal range of motion.     Cervical back: Neck supple.     Right lower leg: No edema.     Left lower leg: No edema.  Lymphadenopathy:     Cervical: No cervical adenopathy.  Skin:    General: Skin is warm and dry.     Coloration: Skin is not pale.  Neurological:     Mental Status: He is alert. Mental status is at baseline.   Psychiatric:        Mood and Affect: Mood normal.    Lab Results  Component Value Date   WBC 5.4 03/13/2020   HGB 14.9 03/13/2020   HCT 43 03/13/2020   PLT 270 03/13/2020   GLUCOSE 147 (H) 08/28/2020   CHOL 209 (H) 08/28/2020   TRIG 214.0 (H) 08/28/2020   HDL 28.40 (L) 08/28/2020   LDLDIRECT 135.0 08/28/2020   LDLCALC 72 05/12/2020  ALT 34 05/12/2020   AST 23 05/12/2020   NA 139 08/28/2020   K 5.0 08/28/2020   CL 103 08/28/2020   CREATININE 1.12 08/28/2020   BUN 14 08/28/2020   CO2 28 08/28/2020   TSH 3.52 08/28/2020   PSA 0.33 05/01/2020   HGBA1C 7.3 (H) 08/28/2020   MICROALBUR 40.9 03/13/2020    No results found.  Assessment & Plan:   Dontario was seen today for hypertension and diabetes.  Diagnoses and all orders for this visit:  Diabetes mellitus without complication (HCC)- His A1c is up to 7.3%.  He is not compliant with metformin.  I recommended that he start taking an SGLT2 inhibitor. -     Basic metabolic panel; Future -     Hemoglobin A1c; Future -     Hemoglobin A1c -     Basic metabolic panel -     empagliflozin (JARDIANCE) 25 MG TABS tablet; Take 1 tablet (25 mg total) by mouth daily before breakfast.  Benign essential HTN- He has not achieved his blood pressure goal of 130/80.  I recommended that he start taking an SGLT2 inhibitor and I anticipate this may help him achieve his blood pressure goal. -     Basic metabolic panel; Future -     TSH; Future -     TSH -     Basic metabolic panel  Hyperlipidemia LDL goal <70- He is not compliant with the statin. -     Lipid panel; Future -     TSH; Future -     TSH -     Lipid panel  Hypertriglyceridemia- His triglycerides are not high enough to warrant medical therapy. -     Lipid panel; Future -     Lipid panel  Acute viral conjunctivitis of right eye -     neomycin-polymyxin b-dexamethasone (MAXITROL) 3.5-10000-0.1 SUSP; Place 2 drops into both eyes every 6 (six) hours.  Other orders -     LDL  cholesterol, direct  I have discontinued Romona Curls. Erline Levine. "Reggie"'s aspirin EC, pravastatin, and metFORMIN. I am also having him start on neomycin-polymyxin b-dexamethasone and empagliflozin. Additionally, I am having him maintain his multivitamin with minerals, fluticasone, (Bilberry, Vaccinium myrtillus, (BILBERRY EXTRACT PO)), Probiotic Product (PROBIOTIC PO), losartan, omeprazole, carvedilol, cetirizine, freestyle, omega-3 acid ethyl esters, OneTouch Verio, NON FORMULARY, Garlique, Barberry-Oreg Grape-Goldenseal (BERBERINE COMPLEX PO), MAGNESIUM OXIDE PO, and FIBER COMPLETE PO.  Meds ordered this encounter  Medications   neomycin-polymyxin b-dexamethasone (MAXITROL) 3.5-10000-0.1 SUSP    Sig: Place 2 drops into both eyes every 6 (six) hours.    Dispense:  5 mL    Refill:  0   empagliflozin (JARDIANCE) 25 MG TABS tablet    Sig: Take 1 tablet (25 mg total) by mouth daily before breakfast.    Dispense:  90 tablet    Refill:  1      Follow-up: Return in about 3 months (around 11/28/2020).  Sanda Linger, MD

## 2020-09-07 ENCOUNTER — Encounter: Payer: Self-pay | Admitting: Internal Medicine

## 2020-09-07 ENCOUNTER — Other Ambulatory Visit: Payer: Self-pay | Admitting: Internal Medicine

## 2020-09-07 DIAGNOSIS — B309 Viral conjunctivitis, unspecified: Secondary | ICD-10-CM

## 2020-09-07 MED ORDER — NEOMYCIN-POLYMYXIN-DEXAMETH 3.5-10000-0.1 OP SUSP: 2.0000 [drp] | Freq: Four times a day (QID) | OPHTHALMIC | 0 refills | Status: DC

## 2020-09-11 ENCOUNTER — Encounter: Payer: Self-pay | Admitting: Internal Medicine

## 2020-09-13 ENCOUNTER — Other Ambulatory Visit: Payer: Self-pay | Admitting: Internal Medicine

## 2020-09-13 DIAGNOSIS — E1122 Type 2 diabetes mellitus with diabetic chronic kidney disease: Secondary | ICD-10-CM

## 2020-09-13 DIAGNOSIS — N182 Chronic kidney disease, stage 2 (mild): Secondary | ICD-10-CM

## 2020-09-15 ENCOUNTER — Other Ambulatory Visit: Payer: Self-pay | Admitting: Internal Medicine

## 2020-09-15 DIAGNOSIS — E781 Pure hyperglyceridemia: Secondary | ICD-10-CM

## 2020-09-28 ENCOUNTER — Other Ambulatory Visit: Payer: Self-pay | Admitting: Family Medicine

## 2020-09-29 ENCOUNTER — Other Ambulatory Visit: Payer: Self-pay | Admitting: Family Medicine

## 2020-10-02 ENCOUNTER — Encounter: Payer: Self-pay | Admitting: Internal Medicine

## 2020-10-02 MED ORDER — CARVEDILOL 12.5 MG PO TABS: ORAL_TABLET | ORAL | 0 refills | Status: DC

## 2020-11-01 ENCOUNTER — Other Ambulatory Visit: Payer: Self-pay

## 2020-11-01 ENCOUNTER — Telehealth (INDEPENDENT_AMBULATORY_CARE_PROVIDER_SITE_OTHER): Payer: 59 | Admitting: Family Medicine

## 2020-11-01 ENCOUNTER — Encounter: Payer: Self-pay | Admitting: Family Medicine

## 2020-11-01 DIAGNOSIS — N183 Chronic kidney disease, stage 3 unspecified: Secondary | ICD-10-CM | POA: Diagnosis not present

## 2020-11-01 DIAGNOSIS — E782 Mixed hyperlipidemia: Secondary | ICD-10-CM | POA: Diagnosis not present

## 2020-11-01 DIAGNOSIS — Z20822 Contact with and (suspected) exposure to covid-19: Secondary | ICD-10-CM

## 2020-11-01 DIAGNOSIS — E1122 Type 2 diabetes mellitus with diabetic chronic kidney disease: Secondary | ICD-10-CM

## 2020-11-01 DIAGNOSIS — I10 Essential (primary) hypertension: Secondary | ICD-10-CM

## 2020-11-01 NOTE — Patient Instructions (Addendum)
I recommend quarantine for the next 5 days, and retest to make sure you are negative.  [email or web address]  I will place a lab only order for your kidney, liver tests, and cholesterol - have this done in 7-10 days.   Please bring your meds to next visit as well as your blood pressure machine to make sure that   Diabetes test at next with me in 1 month. If you are out of meds prior to that visit - let me know.    Return to the clinic or go to the nearest emergency room if any of your symptoms worsen or new symptoms occur.

## 2020-11-01 NOTE — Progress Notes (Signed)
Virtual Visit via Video Note  I connected with Shawn BanJoseph R Camino Jr. on 11/02/20 at 10:19 PM by a video enabled telemedicine application and verified that I am speaking with the correct person using two identifiers.  Patient location: home, by self.  My location: office - [place].   I discussed the limitations, risks, security and privacy concerns of performing an evaluation and management service by telephone and the availability of in person appointments. I also discussed with the patient that there may be a patient responsible charge related to this service. The patient expressed understanding and agreed to proceed, consent obtained  Chief complaint:  Chief Complaint  Patient presents with   Covid Exposure    Pt reports COVID test today negative, denies sxs, pt reports lives with mother who is positive as of Monday  No need for treatment    Establish Care    Pt scheduled to establish care, Sanda Lingerhomas Jones current PCP notes left due to preference. Pt requesting transfer pt reports hxt of hyperlipidemia, DM and HTN, has been trying natural supplements to try and combat this. Pt wants blood work done.      History of Present Illness: Shawn BanJoseph R Mazor Jr. is a 60 y.o. male  New patient to establish care, previous primary care provider Dr. Tanya NonesPickard, Dr. Yetta BarreJones most recently. Some concern about prior labs and urine test.   History of hypertension, hyperlipidemia, CKD, diabetes.  Hypertension: Losartan 100mg  QD.  Carvedilol 12.5mg  BID Home readings: 130/80-90.  BP Readings from Last 3 Encounters:  08/28/20 (!) 142/92  05/12/20 110/70  05/01/20 126/84   Lab Results  Component Value Date   CREATININE 1.12 08/28/2020  Peak creatinine 1.61 on 12/30/19.  Wants to check renal function with new herbal supplements.   Hyperlipidemia: Intolerant to statin. Has tried mutiple statins - unable to tolerate.  Cardiology: Dr Flora Lipps'Neal.  Stress testing in 02/2020: Nuclear stress EF: 55%. The  left ventricular ejection fraction is normal (55-65%). There was no ST segment deviation noted during stress. There is a small, reversible defect of mild severity present in the basal inferoseptal and mid inferoseptal location. Findings consistent with ischemia. This is a low risk study.  Costochondritis in past treated with acupuncture. Flairs at times.  Has taken Garlique, then changed to another natural supplement [facility] supplement.  .  Lab Results  Component Value Date   CHOL 209 (H) 08/28/2020   HDL 28.40 (L) 08/28/2020   LDLCALC 72 05/12/2020   LDLDIRECT 135.0 08/28/2020   TRIG 214.0 (H) 08/28/2020   CHOLHDL 7 08/28/2020   Lab Results  Component Value Date   ALT 34 05/12/2020   AST 23 05/12/2020   ALKPHOS 64 05/12/2020   BILITOT 0.6 05/12/2020    COVID-19 exposure: Lives with his mother who tested positive yesterday. Mother started with symptoms 3 days ago.  He had some rhinorrhea and scratchy throat yesterday. He tested negative today,   On chart review did have Moderna COVID-19 vaccine in March and April 2021, booster May 23 of this year. Not wearing mask.    Diabetes:  Most recent A1c in June, 7.3, up from previous A1c of 6.4 in January. Current meds: no current meds. Has tried multiple meds prior - Januvia caused cramps, nausea, strong urine smell. Jearld Leschradjenta - worked ok, but meds adjusted by las PCP - recommended farxiga - not approved. Tried jardiance - caused back cramps - stopped. Actos wasn't tolerated - unsure of side effects - stomach soreness, back cramps.  Metformin -  was told was concern about kidney issue, but saw nephrologist and told ok to take metformin - stomach cramps after 2 months. Back soreness at times and concern about kidneys or strong urine smell.  Simvastatin caused possible sore in mouth.  Microalbumin: 40.9 on 03/13/20.  Currently on natural supplements. Grape seed extract, berberine and cellular verum bark.  Would like to try tradjenta  again if needed.  Home readings - 116, usually 120-140 in am, rare readings over 200.   Lab Results  Component Value Date   HGBA1C 7.3 (H) 08/28/2020   HGBA1C 6.4 (A) 03/15/2020   HGBA1C 6.5 (H) 12/30/2019   Lab Results  Component Value Date   MICROALBUR 40.9 03/13/2020   LDLCALC 72 05/12/2020   CREATININE 1.12 08/28/2020    Patient Active Problem List   Diagnosis Date Noted   Acute viral conjunctivitis of right eye 08/28/2020   Routine general medical examination at a health care facility 05/01/2020   Hyperlipidemia LDL goal <70 03/15/2020   Hypertriglyceridemia 03/15/2020   Diabetes mellitus without complication (HCC)    Benign essential HTN 08/25/2019   Past Medical History:  Diagnosis Date   Allergy    Arthritis    Chronic bronchitis    Chronic pain    per pt/ right shoulder pain, left leg and ankle   CKD (chronic kidney disease) stage 3, GFR 30-59 ml/min (HCC)    Costochondral chest pain    due to torn tendons.   CTS (carpal tunnel syndrome)    right hand   Diabetes mellitus without complication (HCC)    prediabetes/ no meds   Diverticulitis    GERD (gastroesophageal reflux disease)    History of anal fissures    Hyperlipemia    Hypertension    Rectal bleeding    occasional   Seasonal allergies    Past Surgical History:  Procedure Laterality Date   ANKLE FRACTURE SURGERY  2005 / 2007   left ankle and  left leg/ have screws in leg and ankle   CARPAL TUNNEL RELEASE Left 07/29/2019   Procedure: CARPAL TUNNEL RELEASE;  Surgeon: Cindee Salt, MD;  Location: [facility];  Service: Orthopedics;  Laterality: Left;  FOREARM BLOCK   EXAMINATION UNDER ANESTHESIA N/A 12/15/2012   Procedure: EXAM UNDER ANESTHESIA;  Surgeon: Atilano Ina, MD;  Location: [facility];  Service: General;  Laterality: N/A;   FRACTURE SURGERY  2005 and 2007   lt ankle x2   HEMORRHOIDECTOMY WITH HEMORRHOID BANDING N/A 12/15/2012   Procedure: EXCISIONAL  HEMORRHOIDECTOMY WITH HEMORRHOID BANDING;  Surgeon: Atilano Ina, MD;  Location:  SURGERY CENTER;  Service: General;  Laterality: N/A;   rotator cuff surgery      x 2/ right shoulder   TRIGGER FINGER RELEASE  07/23/2011   Procedure: Right hand- RELEASE TRIGGER FINGER/A-1 PULLEY;  Surgeon: Wyn Forster., MD;  Location:  SURGERY CENTER;  Service: Orthopedics;  Laterality: Right;   WRIST FRACTURE SURGERY  1984   lt with bone graft   WRIST SURGERY     left wrist   Allergies  Allergen Reactions   Amlodipine Other (See Comments)    Per pt - damage to kidney's   Codeine Nausea And Vomiting   Ivp Dye [Iodinated Diagnostic Agents]     Nauseated   Penicillins Nausea And Vomiting    As a child   Sulfa Antibiotics     nauseated   Prior to Admission medications   Medication Sig  Start Date End Date Taking? Authorizing Provider  Barberry-Oreg Grape-Goldenseal (BERBERINE COMPLEX PO) Take 1,200 mg by mouth. 1200mg  of HCI and 200mg  of organic cinnamon bark   Yes [provider]  Bilberry, Vaccinium myrtillus, (BILBERRY EXTRACT PO) Take by mouth.   Yes [provider]  carvedilol (COREG) 12.5 MG tablet TAKE 1 TABLET BY MOUTH TWICE DAILY WITH A MEAL 10/02/20  Yes , MD  cetirizine (ZYRTEC) 10 MG tablet Take 1 tablet (10 mg total) by mouth daily. 10/28/19  Yes Etta Grandchild, MD  FIBER COMPLETE PO Take by mouth. Vita Fusion fiber gummies   Yes [provider]  fluticasone (FLONASE) 50 MCG/ACT nasal spray Place 2 sprays into both nostrils daily. 04/27/18  Yes Donita Brooks, MD  Garlic (GARLIQUE) 400 MG TBEC Take by mouth.   Yes [provider]  Lancets (FREESTYLE) lancets Use as instructed 10/28/19  Yes Donita Brooks, MD  losartan (COZAAR) 100 MG tablet Take 1 tablet (100 mg total) by mouth daily. 10/28/19  Yes Donita Brooks, MD  MAGNESIUM OXIDE PO Take 375 mg by mouth daily.   Yes [provider]  Multiple  Vitamin (MULITIVITAMIN WITH MINERALS) TABS Take 1 tablet by mouth daily.   Yes [provider]  NON FORMULARY Take 500 mg by mouth daily. Mangosteen   Yes [provider]  omega-3 acid ethyl esters (LOVAZA) 1 g capsule Take 2 capsules by mouth twice daily 09/15/20  Yes Donita Brooks, MD  omeprazole (PRILOSEC) 20 MG capsule Take 1 capsule (20 mg total) by mouth 2 (two) times daily before a meal. 10/28/19  Yes Pickard, Etta Grandchild, MD  [facility] VERIO test strip CHECK BLOOD SUGARS IN THE MORNING AND AT BEDTIME. 09/13/20  Yes [facility], MD  empagliflozin (JARDIANCE) 25 MG TABS tablet Take 1 tablet (25 mg total) by mouth daily before breakfast. Patient not taking: Reported on 11/01/2020 08/28/20   11/03/2020, MD  neomycin-polymyxin b-dexamethasone (MAXITROL) 3.5-10000-0.1 SUSP Place 2 drops into both eyes every 6 (six) hours. Patient not taking: Reported on 11/01/2020 09/07/20   11/03/2020, MD   Social History   Socioeconomic History   Marital status: Divorced    Spouse name: Not on file   Number of children: 1   Years of education: Not on file   Highest education level: Not on file  Occupational History   Occupation: disabled  Tobacco Use   Smoking status: Never   Smokeless tobacco: Former    Types: Chew   Tobacco comments:    Quit age 17 y.o  Vaping Use   Vaping Use: Never used  Substance and Sexual Activity   Alcohol use: No   Drug use: No   Sexual activity: Not Currently  Other Topics Concern   Not on file  Social History Narrative   Not on file   Social Determinants of Health   Financial Resource Strain: Not on file  Food Insecurity: Not on file  Transportation Needs: Not on file  Physical Activity: Not on file  Stress: Not on file  Social Connections: Not on file  Intimate Partner Violence: Not on file    Observations/Objective: There were no vitals filed for this visit. Nontoxic appearance on video, all questions were answered.  Euthymic  mood.  Normal respiratory effort.  No distress.  Understanding of plan expressed.  Assessment and Plan: Benign essential HTN  -, In office eval in 1 month, continue home monitoring, bring blood  pressure monitor to next visit.  Controlled type 2 diabetes mellitus with stage 3 chronic kidney disease, without long-term current use of insulin (HCC)  -Lab visit next 10 days, and then an office eval in 1 month.  Can discuss various medications and intolerances at that time.  Mixed hyperlipidemia - Plan: Comprehensive metabolic panel, Lipid panel  -Intolerant to statins per history.  Can discuss options once labs reviewed.  Stage 3 chronic kidney disease, unspecified whether stage 3a or 3b CKD (HCC) - Plan: Comprehensive metabolic panel  -Most recent renal function reassuring, updated labs ordered.  Exposure to COVID-19 virus  -Discussed quarantine and masking guidelines until negative testing within the next 1 week, but CDC guidelines recommended for testing when exposed at home.  RTC precautions if symptomatic.   Follow Up Instructions: 1 month in office. Lab visit 10 days.    I discussed the assessment and treatment plan with the patient. The patient was provided an opportunity to ask questions and all were answered. The patient agreed with the plan and demonstrated an understanding of the instructions.   The patient was advised to call back or seek an in-person evaluation if the symptoms worsen or if the condition fails to improve as anticipated.  Shade Flood, MD

## 2020-11-02 ENCOUNTER — Other Ambulatory Visit: Payer: Self-pay

## 2020-11-02 ENCOUNTER — Encounter: Payer: Self-pay | Admitting: Family Medicine

## 2020-11-04 ENCOUNTER — Other Ambulatory Visit: Payer: Self-pay | Admitting: Family Medicine

## 2020-11-05 ENCOUNTER — Encounter: Payer: Self-pay | Admitting: Family Medicine

## 2020-11-06 ENCOUNTER — Encounter: Payer: Self-pay | Admitting: Family Medicine

## 2020-11-07 ENCOUNTER — Other Ambulatory Visit: Payer: Self-pay

## 2020-11-07 MED ORDER — LOSARTAN POTASSIUM 100 MG PO TABS: 100.0000 mg | ORAL_TABLET | Freq: Every day | ORAL | 0 refills | Status: DC

## 2020-11-07 NOTE — Telephone Encounter (Signed)
Started Friday, this would be day 4 and possible option for antiviral.  Please schedule virtual visit for him if possible today with any provider to review treatment options, and to check on status.  Also can we check on his concern regarding the after hours voicemail?   As far as lab visit, he can wait a week or 2 to have that done.  Thanks

## 2020-11-13 ENCOUNTER — Other Ambulatory Visit: Payer: 59

## 2020-11-15 ENCOUNTER — Other Ambulatory Visit: Payer: Self-pay | Admitting: Family Medicine

## 2020-11-21 ENCOUNTER — Encounter: Payer: Self-pay | Admitting: Family Medicine

## 2020-11-21 ENCOUNTER — Other Ambulatory Visit: Payer: Self-pay

## 2020-11-21 DIAGNOSIS — K219 Gastro-esophageal reflux disease without esophagitis: Secondary | ICD-10-CM

## 2020-11-21 MED ORDER — OMEPRAZOLE 20 MG PO CPDR: 20.0000 mg | DELAYED_RELEASE_CAPSULE | Freq: Two times a day (BID) | ORAL | 3 refills | Status: DC

## 2020-12-15 ENCOUNTER — Other Ambulatory Visit: Payer: Self-pay

## 2020-12-15 ENCOUNTER — Other Ambulatory Visit: Payer: Self-pay | Admitting: Internal Medicine

## 2020-12-15 ENCOUNTER — Encounter: Payer: Self-pay | Admitting: Family Medicine

## 2020-12-15 DIAGNOSIS — E781 Pure hyperglyceridemia: Secondary | ICD-10-CM

## 2020-12-15 MED ORDER — OMEGA-3-ACID ETHYL ESTERS 1 G PO CAPS: 2.0000 | ORAL_CAPSULE | Freq: Two times a day (BID) | ORAL | 0 refills | Status: DC

## 2020-12-22 ENCOUNTER — Encounter: Payer: Self-pay | Admitting: Family Medicine

## 2020-12-22 ENCOUNTER — Other Ambulatory Visit: Payer: Self-pay

## 2020-12-22 ENCOUNTER — Ambulatory Visit (INDEPENDENT_AMBULATORY_CARE_PROVIDER_SITE_OTHER): Payer: 59 | Admitting: Family Medicine

## 2020-12-22 VITALS — BP 138/84 | HR 90 | Temp 98.0°F | Resp 16 | Ht 71.0 in | Wt 219.6 lb

## 2020-12-22 DIAGNOSIS — Z23 Encounter for immunization: Secondary | ICD-10-CM

## 2020-12-22 DIAGNOSIS — E1122 Type 2 diabetes mellitus with diabetic chronic kidney disease: Secondary | ICD-10-CM

## 2020-12-22 DIAGNOSIS — N183 Chronic kidney disease, stage 3 unspecified: Secondary | ICD-10-CM | POA: Diagnosis not present

## 2020-12-22 DIAGNOSIS — I10 Essential (primary) hypertension: Secondary | ICD-10-CM

## 2020-12-22 DIAGNOSIS — E782 Mixed hyperlipidemia: Secondary | ICD-10-CM | POA: Diagnosis not present

## 2020-12-22 LAB — HEMOGLOBIN A1C: Hgb A1c MFr Bld: 6.6 % — ABNORMAL HIGH (ref 4.6–6.5)

## 2020-12-22 LAB — COMPREHENSIVE METABOLIC PANEL
ALT: 37 U/L (ref 0–53)
AST: 25 U/L (ref 0–37)
Albumin: 4.6 g/dL (ref 3.5–5.2)
Alkaline Phosphatase: 79 U/L (ref 39–117)
BUN: 16 mg/dL (ref 6–23)
CO2: 27 mEq/L (ref 19–32)
Calcium: 10 mg/dL (ref 8.4–10.5)
Chloride: 103 mEq/L (ref 96–112)
Creatinine, Ser: 1.08 mg/dL (ref 0.40–1.50)
GFR: 75.43 mL/min (ref >60.00)
Glucose, Bld: 132 mg/dL — ABNORMAL HIGH (ref 70–99)
Potassium: 4.5 mEq/L (ref 3.5–5.1)
Sodium: 139 mEq/L (ref 135–145)
Total Bilirubin: 0.9 mg/dL (ref 0.2–1.2)
Total Protein: 7.2 g/dL (ref 6.0–8.3)

## 2020-12-22 LAB — LDL CHOLESTEROL, DIRECT: Direct LDL: 123 mg/dL

## 2020-12-22 LAB — LIPID PANEL
Cholesterol: 203 mg/dL — ABNORMAL HIGH (ref 0–200)
HDL: 32.1 mg/dL — ABNORMAL LOW (ref >39.00)
NonHDL: 171.34
Total CHOL/HDL Ratio: 6
Triglycerides: 265 mg/dL — ABNORMAL HIGH (ref 0.0–149.0)
VLDL: 53 mg/dL — ABNORMAL HIGH (ref 0.0–40.0)

## 2020-12-22 MED ORDER — LOSARTAN POTASSIUM 100 MG PO TABS: 100.0000 mg | ORAL_TABLET | Freq: Every day | ORAL | 1 refills | Status: DC

## 2020-12-22 MED ORDER — CARVEDILOL 12.5 MG PO TABS: ORAL_TABLET | ORAL | 1 refills | Status: DC

## 2020-12-22 NOTE — Patient Instructions (Addendum)
Based on some lower readings at home at times I recommend keeping on same dose of blood pressure med for now.  Return to the clinic or go to the nearest emergency room if any of your symptoms worsen or new symptoms occur.  Depending on labs we can discuss recommended changes for diabetes and cholesterol.  No changes for now.   Managing Your Hypertension Hypertension, also called high blood pressure, is when the force of the blood pressing against the walls of the arteries is too strong. Arteries are blood vessels that carry blood from your heart throughout your body. Hypertension forces the heart to work harder to pump blood and may cause the arteries to become narrow or stiff. Understanding blood pressure readings Your personal target blood pressure may vary depending on your medical conditions, your age, and other factors. A blood pressure reading includes a higher number over a lower number. Ideally, your blood pressure should be below 120/80. You should know that: The first, or top, number is called the systolic pressure. It is a measure of the pressure in your arteries as your heart beats. The second, or bottom number, is called the diastolic pressure. It is a measure of the pressure in your arteries as the heart relaxes. Blood pressure is classified into four stages. Based on your blood pressure reading, your health care provider may use the following stages to determine what type of treatment you need, if any. Systolic pressure and diastolic pressure are measured in a unit called mmHg. Normal Systolic pressure: below 120. Diastolic pressure: below 80. Elevated Systolic pressure: 120-129. Diastolic pressure: below 80. Hypertension stage 1 Systolic pressure: 130-139. Diastolic pressure: 80-89. Hypertension stage 2 Systolic pressure: 140 or above. Diastolic pressure: 90 or above. How can this condition affect me? Managing your hypertension is an important responsibility. Over time,  hypertension can damage the arteries and decrease blood flow to important parts of the body, including the brain, heart, and kidneys. Having untreated or uncontrolled hypertension can lead to: A heart attack. A stroke. A weakened blood vessel (aneurysm). Heart failure. Kidney damage. Eye damage. Metabolic syndrome. Memory and concentration problems. Vascular dementia. What actions can I take to manage this condition? Hypertension can be managed by making lifestyle changes and possibly by taking medicines. Your health care provider will help you make a plan to bring your blood pressure within a normal range. Nutrition  Eat a diet that is high in fiber and potassium, and low in salt (sodium), added sugar, and fat. An example eating plan is called the Dietary Approaches to Stop Hypertension (DASH) diet. To eat this way: Eat plenty of fresh fruits and vegetables. Try to fill one-half of your plate at each meal with fruits and vegetables. Eat whole grains, such as whole-wheat pasta, brown rice, or whole-grain bread. Fill about one-fourth of your plate with whole grains. Eat low-fat dairy products. Avoid fatty cuts of meat, processed or cured meats, and poultry with skin. Fill about one-fourth of your plate with lean proteins such as fish, chicken without skin, beans, eggs, and tofu. Avoid pre-made and processed foods. These tend to be higher in sodium, added sugar, and fat. Reduce your daily sodium intake. Most people with hypertension should eat less than 1,500 mg of sodium a day. Lifestyle  Work with your health care provider to maintain a healthy body weight or to lose weight. Ask what an ideal weight is for you. Get at least 30 minutes of exercise that causes your heart to beat faster (  aerobic exercise) most days of the week. Activities may include walking, swimming, or biking. Include exercise to strengthen your muscles (resistance exercise), such as weight lifting, as part of your weekly  exercise routine. Try to do these types of exercises for 30 minutes at least 3 days a week. Do not use any products that contain nicotine or tobacco, such as cigarettes, e-cigarettes, and chewing tobacco. If you need help quitting, ask your health care provider. Control any long-term (chronic) conditions you have, such as high cholesterol or diabetes. Identify your sources of stress and find ways to manage stress. This may include meditation, deep breathing, or making time for fun activities. Alcohol use Do not drink alcohol if: Your health care provider tells you not to drink. You are pregnant, may be pregnant, or are planning to become pregnant. If you drink alcohol: Limit how much you use to: 0-1 drink a day for women. 0-2 drinks a day for men. Be aware of how much alcohol is in your drink. In the [place] one drink equals one 12 oz bottle of beer (355 mL), one 5 oz glass of wine (148 mL), or one 1 oz glass of hard liquor (44 mL). Medicines Your health care provider may prescribe medicine if lifestyle changes are not enough to get your blood pressure under control and if: Your systolic blood pressure is 130 or higher. Your diastolic blood pressure is 80 or higher. Take medicines only as told by your health care provider. Follow the directions carefully. Blood pressure medicines must be taken as told by your health care provider. The medicine does not work as well when you skip doses. Skipping doses also puts you at risk for problems. Monitoring Before you monitor your blood pressure: Do not smoke, drink caffeinated beverages, or exercise within 30 minutes before taking a measurement. Use the bathroom and empty your bladder (urinate). Sit quietly for at least 5 minutes before taking measurements. Monitor your blood pressure at home as told by your health care provider. To do this: Sit with your back straight and supported. Place your feet flat on the floor. Do not cross your legs. Support  your arm on a flat surface, such as a table. Make sure your upper arm is at heart level. Each time you measure, take two or three readings one minute apart and record the results. You may also need to have your blood pressure checked regularly by your health care provider. General information Talk with your health care provider about your diet, exercise habits, and other lifestyle factors that may be contributing to hypertension. Review all the medicines you take with your health care provider because there may be side effects or interactions. Keep all visits as told by your health care provider. Your health care provider can help you create and adjust your plan for managing your high blood pressure. Where to find more information National Heart, Lung, and Blood Institute: PopSte[employer].is American Heart Association: www.heart.org Contact a health care provider if: You think you are having a reaction to medicines you have taken. You have repeated (recurrent) headaches. You feel dizzy. You have swelling in your ankles. You have trouble with your vision. Get help right away if: You develop a severe headache or confusion. You have unusual weakness or numbness, or you feel faint. You have severe pain in your chest or abdomen. You vomit repeatedly. You have trouble breathing. These symptoms may represent a serious problem that is an emergency. Do not wait to see if the symptoms  will go away. Get medical help right away. Call your local emergency services (911 in the [place] Do not drive yourself to the hospital. Summary Hypertension is when the force of blood pumping through your arteries is too strong. If this condition is not controlled, it may put you at risk for serious complications. Your personal target blood pressure may vary depending on your medical conditions, your age, and other factors. For most people, a normal blood pressure is less than 120/80. Hypertension is managed by lifestyle  changes, medicines, or both. Lifestyle changes to help manage hypertension include losing weight, eating a healthy, low-sodium diet, exercising more, stopping smoking, and limiting alcohol. This information is not intended to replace advice given to you by your health care provider. Make sure you discuss any questions you have with your health care provider. Document Revised: 03/26/2019 Document Reviewed: 01/19/2019 Elsevier Patient Education  2022 [employer].

## 2020-12-22 NOTE — Progress Notes (Signed)
Subjective:  Patient ID: Shawn Ball., male    DOB: [birth year]  Age: 60 y.o. MRN: [id number]  CC:  Chief Complaint  Patient presents with   Hypertension    Pt here today for recheck and for lab work, needs refills soon    Diabetes    Reports doing okay for now, due for recheck, trying to take supplements reports generally good numbers.     HPI [name] presents for   Hypertension: With CKD, discussed briefly at his establish care visit on August 31.  Treated with losartan 100 mg daily, carvedilol 12.5 mg twice daily.  He does take some herbal supplements, and wanted to make sure his kidney function was stable.  Peak creatinine 1.61 in October 2021. Home readings: meter higher (133/94 on his meter at time of our 138/84).  126/89, 113/72, 118/71, 133/94, 136/96, HR 90 - slight lightheadedness when got up. BP Readings from Last 3 Encounters:  12/22/20 138/84  08/28/20 (!) 142/92  05/12/20 110/70   Lab Results  Component Value Date   CREATININE 1.12 08/28/2020   Hyperlipidemia: Discussed last visit, intolerant to multiple statins in the past, cardiologist is Shawn Ball.  Low risk stress testing in December 2021.  Has tried over-the-counter supplements to help with cholesterol including garlique. Now using other supplement. Does take Lovaza 2 caps BID.   Lab Results  Component Value Date   CHOL 209 (H) 08/28/2020   HDL 28.40 (L) 08/28/2020   LDLCALC 72 05/12/2020   LDLDIRECT 135.0 08/28/2020   TRIG 214.0 (H) 08/28/2020   CHOLHDL 7 08/28/2020   Lab Results  Component Value Date   ALT 34 05/12/2020   AST 23 05/12/2020   ALKPHOS 64 05/12/2020   BILITOT 0.6 05/12/2020    Diabetes: With hyperglycemia based on A1c in June.  Diet controlled.  See last visit, he has tried multiple medications prior with difficulty tolerating including Januvia causing cramps, nausea, strong urine smell, Tradjenta, Marcelline Deist that was not improved, Jardiance caused back cramps,  Actos with stomach soreness and back cramps, metformin concerned about kidney issue but also has stomach cramps.  Microalbumin 40.9 on 03/13/2020.  Has tried treating with grapeseed extract, berberine, will review embark.  Would like to consider Tradjenta again if covered and needed based on A1c. Home readings 120-140 in the morning usually.  Rare readings over 200 when discussed in August, plan for A1c today.still rare 202 with eating sweets - better with using cinnamon.    Lab Results  Component Value Date   HGBA1C 7.3 (H) 08/28/2020   HGBA1C 6.4 (A) 03/15/2020   HGBA1C 6.5 (H) 12/30/2019   Lab Results  Component Value Date   MICROALBUR 40.9 03/13/2020   LDLCALC 72 05/12/2020   CREATININE 1.12 08/28/2020       History Patient Active Problem List   Diagnosis Date Noted   Acute viral conjunctivitis of right eye 08/28/2020   Routine general medical examination at a health care facility 05/01/2020   Hyperlipidemia LDL goal <70 03/15/2020   Hypertriglyceridemia 03/15/2020   Diabetes mellitus without complication (HCC)    Benign essential HTN 08/25/2019   Past Medical History:  Diagnosis Date   Allergy    Arthritis    Chronic bronchitis    Chronic pain    per pt/ right shoulder pain, left leg and ankle   CKD (chronic kidney disease) stage 3, GFR 30-59 ml/min (HCC)    Costochondral chest pain    due to torn  tendons.   CTS (carpal tunnel syndrome)    right hand   Diabetes mellitus without complication (HCC)    prediabetes/ no meds   Diverticulitis    GERD (gastroesophageal reflux disease)    History of anal fissures    Hyperlipemia    Hypertension    Rectal bleeding    occasional   Seasonal allergies    Past Surgical History:  Procedure Laterality Date   ANKLE FRACTURE SURGERY  2005 / 2007   left ankle and  left leg/ have screws in leg and ankle   CARPAL TUNNEL RELEASE Left 07/29/2019   Procedure: CARPAL TUNNEL RELEASE;  Surgeon: Shawn Salt, Ball;  Location: [facility]  [place];  Service: Orthopedics;  Laterality: Left;  FOREARM BLOCK   EXAMINATION UNDER ANESTHESIA N/A 12/15/2012   Procedure: EXAM UNDER ANESTHESIA;  Surgeon: Shawn Ball;  Location: [facility];  Service: General;  Laterality: N/A;   FRACTURE SURGERY  2005 and 2007   lt ankle x2   HEMORRHOIDECTOMY WITH HEMORRHOID BANDING N/A 12/15/2012   Procedure: EXCISIONAL HEMORRHOIDECTOMY WITH HEMORRHOID BANDING;  Surgeon: Shawn Ball;  Location: [facility];  Service: General;  Laterality: N/A;   rotator cuff surgery      x 2/ right shoulder   TRIGGER FINGER RELEASE  07/23/2011   Procedure: Right hand- RELEASE TRIGGER FINGER/A-1 PULLEY;  Surgeon: Shawn Ball., Ball;  Location: [facility];  Service: Orthopedics;  Laterality: Right;   WRIST FRACTURE SURGERY  1984   lt with bone graft   WRIST SURGERY     left wrist   Allergies  Allergen Reactions   Amlodipine Other (See Comments)    Per pt - damage to kidney's   Codeine Nausea And Vomiting   Ivp Dye [Iodinated Diagnostic Agents]     Nauseated   Penicillins Nausea And Vomiting    As a child   Sulfa Antibiotics     nauseated   Prior to Admission medications   Medication Sig Start Date End Date Taking? Authorizing Provider  ASPIRIN 81 PO Take 1 tablet by mouth daily.   Yes Provider, Historical, Ball  Barberry-Oreg Grape-Goldenseal (BERBERINE COMPLEX PO) Take 1,200 mg by mouth. 1200mg  of HCI and 200mg  of organic cinnamon bark   Yes Provider, Historical, Ball  Biotin 5000 MCG CAPS  12/03/14  Yes Provider, Historical, Ball  carvedilol (COREG) 12.5 MG tablet TAKE 1 TABLET BY MOUTH TWICE DAILY WITH A MEAL 10/02/20  Yes 02/02/15, Ball  cetirizine (ZYRTEC) 10 MG tablet Take 1 tablet (10 mg total) by mouth daily. 10/28/19  Yes Shawn Grandchild, Ball  FIBER COMPLETE PO Take by mouth. Vita Fusion fiber gummies   Yes Provider, Historical, Ball  fluticasone (FLONASE) 50 MCG/ACT nasal spray Place 2  sprays into both nostrils daily. 04/27/18  Yes Shawn Brooks, Ball  Garlic Oil 1000 MG CAPS  10/02/20  Yes Provider, Historical, Ball  Glucosamine-Chondroitin-MSM 500-200-150 MG TABS Take by mouth.   Yes Provider, Historical, Ball  L-Lysine 1000 MG TABS Take by mouth.   Yes Provider, Historical, Ball  L-Lysine 1000 MG TABS  03/04/12  Yes Provider, Historical, Ball  Lancets (FREESTYLE) lancets Use as instructed 10/28/19  Yes 05/02/12, Ball  losartan (COZAAR) 100 MG tablet Take 1 tablet (100 mg total) by mouth daily. 11/07/20  Yes Shawn Brooks, Ball  MAGNESIUM OXIDE PO Take 375 mg by mouth daily.   Yes Provider, Historical, Ball  Multiple Vitamin (MULITIVITAMIN WITH MINERALS) TABS Take 1 tablet by mouth daily.   Yes Provider, Historical, Ball  Multiple Vitamins-Minerals (MENS MULTIVITAMIN PLUS PO) Take by mouth.   Yes Provider, Historical, Ball  NON FORMULARY Take 500 mg by mouth daily. Mangosteen   Yes Provider, Historical, Ball  NON FORMULARY Ceylon Cinnamon multiple tablets of this kind as well   Yes Provider, Historical, Ball  Nutritional Supplements (GRAPESEED EXTRACT) 500-50 MG CAPS  09/01/20  Yes Provider, Historical, Ball  omega-3 acid ethyl esters (LOVAZA) 1 g capsule Take 2 capsules (2 g total) by mouth 2 (two) times daily. 12/15/20  Yes Shade Flood, Ball  omeprazole (PRILOSEC) 20 MG capsule Take 1 capsule (20 mg total) by mouth 2 (two) times daily before a meal. 11/21/20  Yes Shade Flood, Ball  [facility] VERIO test strip CHECK BLOOD SUGARS IN THE MORNING AND AT BEDTIME. 09/13/20  Yes Shawn Grandchild, Ball   Social History   Socioeconomic History   Marital status: Divorced    Spouse name: Not on file   Number of children: 1   Years of education: Not on file   Highest education level: Not on file  Occupational History   Occupation: disabled  Tobacco Use   Smoking status: Never   Smokeless tobacco: Former    Types: Chew   Tobacco comments:    Quit age 60 y.o  Vaping Use   Vaping Use: Never  used  Substance and Sexual Activity   Alcohol use: No   Drug use: No   Sexual activity: Not Currently  Other Topics Concern   Not on file  Social History Narrative   Not on file   Social Determinants of Health   Financial Resource Strain: Not on file  Food Insecurity: Not on file  Transportation Needs: Not on file  Physical Activity: Not on file  Stress: Not on file  Social Connections: Not on file  Intimate Partner Violence: Not on file    Review of Systems Per HPI Objective:   Vitals:   12/22/20 1003  BP: 138/84  Pulse: 90  Resp: 16  Temp: 98 F (36.7 C)  TempSrc: Temporal  SpO2: 100%  Weight: 219 lb 9.6 oz (99.6 kg)  Height: 5\' 11"  (1.803 m)     Physical Exam Vitals reviewed.  Constitutional:      Appearance: He is well-developed.  HENT:     Head: Normocephalic and atraumatic.  Neck:     Vascular: No carotid bruit or JVD.  Cardiovascular:     Rate and Rhythm: Normal rate and regular rhythm.     Heart sounds: Normal heart sounds. No murmur heard. Pulmonary:     Effort: Pulmonary effort is normal.     Breath sounds: Normal breath sounds. No rales.  Musculoskeletal:     Right lower leg: No edema.     Left lower leg: No edema.  Skin:    General: Skin is warm and dry.  Neurological:     Mental Status: He is alert and oriented to person, place, and time.  Psychiatric:        Mood and Affect: Mood normal.       Assessment & Plan:  Keland Peyton. is a 60 y.o. male . Benign essential HTN - Plan: carvedilol (COREG) 12.5 MG tablet, losartan (COZAAR) 100 MG tablet, Comprehensive metabolic panel  -Stable with current medication regimen.  Some variability in home readings, but handout given on appropriate home monitoring and management of  hypertension.  With some of his lower readings hesitant to add medication at this time.  RTC precautions.  Mixed hyperlipidemia - Plan: Lipid panel, Comprehensive metabolic panel, CANCELED: Lipid panel, CANCELED:  Comprehensive metabolic panel  -Intolerant to statins.  Check lipid panel, consider lipid clinic referral.  Can also discuss with cardiology.  Stage 3 chronic kidney disease, unspecified whether stage 3a or 3b CKD (HCC) - Plan: CANCELED: Comprehensive metabolic panel  -Continue hydration, avoid nephrotoxins, monitor BP control, updated labs.  Need for influenza vaccination - Plan: Flu Vaccine QUAD 6+ mos PF IM (Fluarix Quad PF)  Controlled type 2 diabetes mellitus with stage 3 chronic kidney disease, without long-term current use of insulin (HCC) - Plan: Hemoglobin A1c  -On herbal supplements only at this time.  Check updated A1c.  Continue to watch diet, lifestyle modification.  We did discuss that I am unable to recommend for or against some of his herbal medications/supplements.  Option of meeting with alternative medicine practice or naturopath to discuss those treatments further if he would like to. No orders of the defined types were placed in this encounter.  There are no Patient Instructions on file for this visit.    Signed,   Meredith Staggers, Ball [facility], [facility] 12/22/20 10:41 AM

## 2020-12-30 ENCOUNTER — Encounter: Payer: Self-pay | Admitting: Family Medicine

## 2020-12-31 ENCOUNTER — Other Ambulatory Visit: Payer: Self-pay | Admitting: Family Medicine

## 2020-12-31 DIAGNOSIS — I10 Essential (primary) hypertension: Secondary | ICD-10-CM

## 2021-01-01 ENCOUNTER — Other Ambulatory Visit: Payer: Self-pay

## 2021-03-15 ENCOUNTER — Other Ambulatory Visit: Payer: Self-pay | Admitting: Family Medicine

## 2021-03-15 ENCOUNTER — Encounter: Payer: Self-pay | Admitting: Family Medicine

## 2021-03-15 ENCOUNTER — Other Ambulatory Visit: Payer: Self-pay

## 2021-03-15 DIAGNOSIS — E781 Pure hyperglyceridemia: Secondary | ICD-10-CM

## 2021-03-15 MED ORDER — OMEGA-3-ACID ETHYL ESTERS 1 G PO CAPS: 2.0000 | ORAL_CAPSULE | Freq: Two times a day (BID) | ORAL | 0 refills | Status: DC

## 2021-03-15 MED ORDER — OMEGA-3-ACID ETHYL ESTERS 1 G PO CAPS: 2.0000 | ORAL_CAPSULE | Freq: Two times a day (BID) | ORAL | 2 refills | Status: DC

## 2021-04-19 ENCOUNTER — Encounter: Payer: 59 | Admitting: Family Medicine

## 2021-05-04 ENCOUNTER — Other Ambulatory Visit: Payer: Self-pay

## 2021-05-04 ENCOUNTER — Ambulatory Visit (INDEPENDENT_AMBULATORY_CARE_PROVIDER_SITE_OTHER)
Admission: RE | Admit: 2021-05-04 | Discharge: 2021-05-04 | Disposition: A | Discharge status: Home / Self Care | Payer: 59 | Source: Ambulatory Visit | Attending: Family Medicine | Admitting: Family Medicine

## 2021-05-04 ENCOUNTER — Ambulatory Visit: Payer: 59 | Admitting: Family Medicine

## 2021-05-04 ENCOUNTER — Ambulatory Visit (INDEPENDENT_AMBULATORY_CARE_PROVIDER_SITE_OTHER): Payer: 59 | Admitting: Family Medicine

## 2021-05-04 ENCOUNTER — Encounter: Payer: Self-pay | Admitting: Family Medicine

## 2021-05-04 VITALS — BP 128/78 | HR 84 | Temp 97.8°F | Resp 16 | Ht 71.0 in | Wt 219.2 lb

## 2021-05-04 DIAGNOSIS — I10 Essential (primary) hypertension: Secondary | ICD-10-CM

## 2021-05-04 DIAGNOSIS — Z0000 Encounter for general adult medical examination without abnormal findings: Secondary | ICD-10-CM

## 2021-05-04 DIAGNOSIS — E782 Mixed hyperlipidemia: Secondary | ICD-10-CM | POA: Diagnosis not present

## 2021-05-04 DIAGNOSIS — M25512 Pain in left shoulder: Secondary | ICD-10-CM | POA: Diagnosis not present

## 2021-05-04 DIAGNOSIS — Z125 Encounter for screening for malignant neoplasm of prostate: Secondary | ICD-10-CM

## 2021-05-04 DIAGNOSIS — N183 Chronic kidney disease, stage 3 unspecified: Secondary | ICD-10-CM | POA: Diagnosis not present

## 2021-05-04 DIAGNOSIS — E1122 Type 2 diabetes mellitus with diabetic chronic kidney disease: Secondary | ICD-10-CM

## 2021-05-04 LAB — CBC WITH DIFFERENTIAL/PLATELET
Basophils Absolute: 0.1 10*3/uL (ref 0.0–0.1)
Basophils Relative: 0.8 % (ref 0.0–3.0)
Eosinophils Absolute: 0.2 10*3/uL (ref 0.0–0.7)
Eosinophils Relative: 3 % (ref 0.0–5.0)
HCT: 45.6 % (ref 39.0–52.0)
Hemoglobin: 15.6 g/dL (ref 13.0–17.0)
Lymphocytes Relative: 28.8 % (ref 12.0–46.0)
Lymphs Abs: 1.8 10*3/uL (ref 0.7–4.0)
MCHC: 34.2 g/dL (ref 30.0–36.0)
MCV: 84.1 fl (ref 78.0–100.0)
Monocytes Absolute: 0.4 10*3/uL (ref 0.1–1.0)
Monocytes Relative: 5.7 % (ref 3.0–12.0)
Neutro Abs: 3.9 10*3/uL (ref 1.4–7.7)
Neutrophils Relative %: 61.7 % (ref 43.0–77.0)
Platelets: 242 10*3/uL (ref 150.0–400.0)
RBC: 5.42 Mil/uL (ref 4.22–5.81)
RDW: 13.5 % (ref 11.5–15.5)
WBC: 6.3 10*3/uL (ref 4.0–10.5)

## 2021-05-04 LAB — COMPREHENSIVE METABOLIC PANEL
ALT: 31 U/L (ref 0–53)
AST: 21 U/L (ref 0–37)
Albumin: 4.5 g/dL (ref 3.5–5.2)
Alkaline Phosphatase: 65 U/L (ref 39–117)
BUN: 21 mg/dL (ref 6–23)
CO2: 24 mEq/L (ref 19–32)
Calcium: 9.8 mg/dL (ref 8.4–10.5)
Chloride: 104 mEq/L (ref 96–112)
Creatinine, Ser: 1.13 mg/dL (ref 0.40–1.50)
GFR: 71.26 mL/min (ref >60.00)
Glucose, Bld: 141 mg/dL — ABNORMAL HIGH (ref 70–99)
Potassium: 4.2 mEq/L (ref 3.5–5.1)
Sodium: 139 mEq/L (ref 135–145)
Total Bilirubin: 0.6 mg/dL (ref 0.2–1.2)
Total Protein: 7 g/dL (ref 6.0–8.3)

## 2021-05-04 LAB — LIPID PANEL
Cholesterol: 218 mg/dL — ABNORMAL HIGH (ref 0–200)
HDL: 31.7 mg/dL — ABNORMAL LOW (ref >39.00)
NonHDL: 186.5
Total CHOL/HDL Ratio: 7
Triglycerides: 222 mg/dL — ABNORMAL HIGH (ref 0.0–149.0)
VLDL: 44.4 mg/dL — ABNORMAL HIGH (ref 0.0–40.0)

## 2021-05-04 LAB — PSA: PSA: 0.32 ng/mL (ref 0.10–4.00)

## 2021-05-04 LAB — LDL CHOLESTEROL, DIRECT: Direct LDL: 136 mg/dL

## 2021-05-04 LAB — MICROALBUMIN / CREATININE URINE RATIO
Creatinine,U: 139.4 mg/dL
Microalb Creat Ratio: 37.7 mg/g — ABNORMAL HIGH (ref 0.0–30.0)
Microalb, Ur: 52.5 mg/dL — ABNORMAL HIGH (ref 0.0–1.9)

## 2021-05-04 LAB — HEMOGLOBIN A1C: Hgb A1c MFr Bld: 6.7 % — ABNORMAL HIGH (ref 4.6–6.5)

## 2021-05-04 MED ORDER — LOSARTAN POTASSIUM 100 MG PO TABS: 100.0000 mg | ORAL_TABLET | Freq: Every day | ORAL | 1 refills | Status: DC

## 2021-05-04 NOTE — Progress Notes (Signed)
Subjective:  Patient ID: Shawn Ball., male    DOB: Jun 02, 1960  Age: 61 y.o. MRN: [id number]  CC:  Chief Complaint  Patient presents with   Annual Exam    Pt complains of pain in Lt shoulder pain 1 month but will be coming back for quality visit in a few weeks     HPI Shawn Ball. presents for   Annual physical exam and other concerns. Living with mom - helps with her care, vascular dementia.   Hypertension: With CKD, treated with losartan, carvedilol.  Peak creatinine 1.61 in October 2021.  Cardiology, Dr. Audie Box.  Low risk stress testing in December 2021. Home readings: usually 120/80 No new side effects BP Readings from Last 3 Encounters:  05/04/21 128/78  12/22/20 138/84  08/28/20 (!) 142/92   Lab Results  Component Value Date   CREATININE 1.08 12/22/2020   Hyperlipidemia: Mel Almond for treatment previously. Also using bitter melon.  Fasting today.  Lab Results  Component Value Date   CHOL 203 (H) 12/22/2020   HDL 32.10 (L) 12/22/2020   LDLCALC 72 05/12/2020   LDLDIRECT 123.0 12/22/2020   TRIG 265.0 (H) 12/22/2020   CHOLHDL 6 12/22/2020   Lab Results  Component Value Date   ALT 37 12/22/2020   AST 25 12/22/2020   ALKPHOS 79 12/22/2020   BILITOT 0.9 12/22/2020   Diabetes: With prior hyperglycemia, diet controlled.  Intolerant to multiple meds, discussed last visit.  Improved A1c in October. Had been using cinnamon, grapefruit seed extract now using bitter melon past few days.  Home readings: 120-132 in am usually, 170-180 after meals.  Microalbumin: 40.9 on 03/13/2020 Optho, foot exam, pneumovax: up to date.  Lab Results  Component Value Date   HGBA1C 6.6 (H) 12/22/2020   HGBA1C 7.3 (H) 08/28/2020   HGBA1C 6.4 (A) 03/15/2020   Lab Results  Component Value Date   MICROALBUR 40.9 03/13/2020   LDLCALC 72 05/12/2020   CREATININE 1.08 12/22/2020    L shoulder pain: Hx of R shoulder debility d/t prior surgery. Primarily uses  left arm/shoulder. Past month or so - some popping in shoulder, cracking. Pain at times. Feels like out of joint a few weeks ago - just felt pop. Better next day until popped again. No specific injury. No prior surgery, injection on left shoulder Tx: tylenol.  Depression screen [facility] 2/9 05/04/2021 12/22/2020 11/01/2020 03/15/2020 11/03/2018  Decreased Interest 0 0 0 0 0  Down, Depressed, Hopeless 0 0 0 0 0  PHQ - 2 Score 0 0 0 0 0  Altered sleeping 1 1 - - -  Tired, decreased energy 0 0 - - -  Change in appetite 0 0 - - -  Feeling bad or failure about yourself  0 0 - - -  Trouble concentrating 0 0 - - -  Moving slowly or fidgety/restless 0 0 - - -  Suicidal thoughts 0 0 - - -  PHQ-9 Score 1 1 - - -  Difficult doing work/chores - - - - -   Cancer screening Colonoscopy 06/11/2017, Dr. Loletha Carrow, diverticulosis, otherwise normal, repeat 10 years. The natural history of prostate cancer and ongoing controversy regarding screening and potential treatment outcomes of prostate cancer has been discussed with the patient. The meaning of a false positive PSA and a false negative PSA has been discussed. He indicates understanding of the limitations of this screening test and wishes to proceed with screening PSA testing. No FH of prostate CA Lab  Results  Component Value Date   PSA 0.33 05/01/2020   PSA 0.4 04/29/2019   PSA 0.3 04/15/2018   Immunization History  Administered Date(s) Administered   Influenza,inj,Quad PF,6+ Mos 04/20/2018, 11/03/2018, 12/29/2019, 12/22/2020   Moderna Sars-Covid-2 Vaccination 05/11/2019, 06/08/2019, 07/24/2020   Pneumococcal Conjugate-13 03/15/2020   Pneumococcal Polysaccharide-23 04/14/2017   Tdap 03/15/2020   Zoster Recombinat (Shingrix) 05/22/2017, 09/09/2017  Had bivalent covid vaccine last fall at pharmacy.   Optho Visit 07/10/2020, no new vision symptoms.   Dental No recent dental visit.   Alcohol None  Tobacco None  Exercise Limited due to time constraints  with care of mother.  Has exercise bike. May be easier.  Wt Readings from Last 3 Encounters:  05/04/21 219 lb 3.2 oz (99.4 kg)  12/22/20 219 lb 9.6 oz (99.6 kg)  08/28/20 223 lb (101.2 kg)     History Patient Active Problem List   Diagnosis Date Noted   Acute viral conjunctivitis of right eye 08/28/2020   Routine general medical examination at a health care facility 05/01/2020   Hyperlipidemia LDL goal <70 03/15/2020   Hypertriglyceridemia 03/15/2020   Diabetes mellitus without complication (HCC)    Benign essential HTN 08/25/2019   Past Medical History:  Diagnosis Date   Allergy    Arthritis    Chronic bronchitis    Chronic pain    per pt/ right shoulder pain, left leg and ankle   CKD (chronic kidney disease) stage 3, GFR 30-59 ml/min (HCC)    Costochondral chest pain    due to torn tendons.   CTS (carpal tunnel syndrome)    right hand   Diabetes mellitus without complication (HCC)    prediabetes/ no meds   Diverticulitis    GERD (gastroesophageal reflux disease)    History of anal fissures    Hyperlipemia    Hypertension    Rectal bleeding    occasional   Seasonal allergies    Past Surgical History:  Procedure Laterality Date   ANKLE FRACTURE SURGERY  2005 / 2007   left ankle and  left leg/ have screws in leg and ankle   CARPAL TUNNEL RELEASE Left 07/29/2019   Procedure: CARPAL TUNNEL RELEASE;  Surgeon: Cindee Salt, MD;  Location: [facility];  Service: Orthopedics;  Laterality: Left;  FOREARM BLOCK   EXAMINATION UNDER ANESTHESIA N/A 12/15/2012   Procedure: EXAM UNDER ANESTHESIA;  Surgeon: Atilano Ina, MD;  Location: [facility];  Service: General;  Laterality: N/A;   FRACTURE SURGERY  2005 and 2007   lt ankle x2   HEMORRHOIDECTOMY WITH HEMORRHOID BANDING N/A 12/15/2012   Procedure: EXCISIONAL HEMORRHOIDECTOMY WITH HEMORRHOID BANDING;  Surgeon: Atilano Ina, MD;  Location: [facility];  Service: General;  Laterality:  N/A;   rotator cuff surgery      x 2/ right shoulder   TRIGGER FINGER RELEASE  07/23/2011   Procedure: Right hand- RELEASE TRIGGER FINGER/A-1 PULLEY;  Surgeon: Wyn Forster., MD;  Location: [facility];  Service: Orthopedics;  Laterality: Right;   WRIST FRACTURE SURGERY  1984   lt with bone graft   WRIST SURGERY     left wrist   Allergies  Allergen Reactions   Amlodipine Other (See Comments)    Per pt - damage to kidney's   Codeine Nausea And Vomiting   Ivp Dye [Iodinated Contrast Media]     Nauseated   Penicillins Nausea And Vomiting    As a child  Sulfa Antibiotics     nauseated   Prior to Admission medications   Medication Sig Start Date End Date Taking? Authorizing Provider  ASPIRIN 81 PO Take 1 tablet by mouth daily.   Yes [provider]  Barberry-Oreg Grape-Goldenseal (BERBERINE COMPLEX PO) Take 1,200 mg by mouth. 1200mg  of HCI and 200mg  of organic cinnamon bark   Yes [provider]  Biotin 5000 MCG CAPS 2 times a week 12/03/14  Yes [provider]  carvedilol (COREG) 12.5 MG tablet TAKE 1 TABLET BY MOUTH TWICE DAILY WITH A MEAL 12/22/20  Yes Wendie Agreste, MD  cetirizine (ZYRTEC) 10 MG tablet Take 1 tablet (10 mg total) by mouth daily. 10/28/19  Yes Susy Frizzle, MD  CINNAMON PO 2,000 mg. 10/02/20  Yes [provider]  FIBER COMPLETE PO Take by mouth. Vita Fusion fiber gummies   Yes [provider]  fluticasone (FLONASE) 50 MCG/ACT nasal spray Place 2 sprays into both nostrils daily. 04/27/18  Yes Susy Frizzle, MD  Garlic Oil 123XX123 MG CAPS  10/02/20  Yes [provider]  Glucosamine-Chondroitin-MSM 500-200-150 MG TABS Take by mouth.   Yes [provider]  L-Lysine 1000 MG TABS Take by mouth.   Yes [provider]  L-Lysine 1000 MG TABS  03/04/12  Yes [provider]  Lancets (FREESTYLE) lancets Use as instructed 10/28/19  Yes Susy Frizzle, MD  losartan (COZAAR)  100 MG tablet Take 1 tablet (100 mg total) by mouth daily. 12/22/20  Yes Wendie Agreste, MD  MAGNESIUM OXIDE PO Take 375 mg by mouth daily.   Yes [provider]  Multiple Vitamins-Minerals ([place]clay)  06/02/17  Yes [provider]  NON FORMULARY Take 500 mg by mouth daily. Mangosteen   Yes [provider]  NON FORMULARY Ceylon Cinnamon multiple tablets of this kind as well   Yes [provider]  Nutritional Supplements (GRAPESEED EXTRACT) 500-50 MG CAPS  09/01/20  Yes [provider]  omega-3 acid ethyl esters (LOVAZA) 1 g capsule Take 2 capsules (2 g total) by mouth 2 (two) times daily. Patient taking differently: Take 2 capsules by mouth 2 (two) times daily. Bitter melon complex 03/15/21  Yes Wendie Agreste, MD  omega-3 acid ethyl esters (LOVAZA) 1 g capsule Take 2 capsules (2 g total) by mouth 2 (two) times daily. 03/15/21  Yes Wendie Agreste, MD  omeprazole (PRILOSEC) 20 MG capsule Take 1 capsule (20 mg total) by mouth 2 (two) times daily before a meal. 11/21/20  Yes Wendie Agreste, MD  [facility] VERIO test strip CHECK BLOOD SUGARS IN THE MORNING AND AT BEDTIME. 09/13/20  Yes Janith Lima, MD  OVER THE COUNTER MEDICATION BLUEBERRY 8000mg  09/01/20  Yes [provider]   Social History   Socioeconomic History   Marital status: Divorced    Spouse name: Not on file   Number of children: 1   Years of education: Not on file   Highest education level: Not on file  Occupational History   Occupation: disabled  Tobacco Use   Smoking status: Never   Smokeless tobacco: Former    Types: Chew   Tobacco comments:    Quit age 10 y.o  Vaping Use   Vaping Use: Never used  Substance and Sexual Activity   Alcohol use: No   Drug use: No   Sexual activity: Not Currently  Other Topics Concern   Not on file  Social History Narrative  Not on file   Social Determinants of Health   Financial Resource Strain: Not on  file  Food Insecurity: Not on file  Transportation Needs: Not on file  Physical Activity: Not on file  Stress: Not on file  Social Connections: Not on file  Intimate Partner Violence: Not on file    Review of Systems  13 point review of systems per patient health survey noted.  Negative other than as indicated above or in HPI.   Objective:   Vitals:   05/04/21 0808  BP: 128/78  Pulse: 84  Resp: 16  Temp: 97.8 F (36.6 C)  TempSrc: Temporal  SpO2: 95%  Weight: 219 lb 3.2 oz (99.4 kg)  Height: 5\' 11"  (1.803 m)     Physical Exam Vitals reviewed.  Constitutional:      Appearance: He is well-developed.  HENT:     Head: Normocephalic and atraumatic.     Right Ear: External ear normal.     Left Ear: External ear normal.  Eyes:     Conjunctiva/sclera: Conjunctivae normal.     Pupils: Pupils are equal, round, and reactive to light.  Neck:     Thyroid: No thyromegaly.  Cardiovascular:     Rate and Rhythm: Normal rate and regular rhythm.     Heart sounds: Normal heart sounds.  Pulmonary:     Effort: Pulmonary effort is normal. No respiratory distress.     Breath sounds: Normal breath sounds. No wheezing.  Abdominal:     General: There is no distension.     Palpations: Abdomen is soft.     Tenderness: There is no abdominal tenderness.  Musculoskeletal:        General: No tenderness. Normal range of motion.     Cervical back: Normal range of motion and neck supple.     Comments: C-spine pain-free range of motion, no midline bony tenderness Left shoulder, skin intact, no erythema.  No focal bony tenderness.  Minimal discomfort anterior left shoulder but bicipital groove nontender.  Internal rotation to approximately L5, slightly higher than right shoulder.  Full flexion, extension, ABduction.  Negative empty can, negative drop arm.  Some discomfort with Neer's, negative Hawkins.  Lymphadenopathy:     Cervical: No cervical adenopathy.  Skin:    General: Skin is warm and  dry.  Neurological:     Mental Status: He is alert and oriented to person, place, and time.     Deep Tendon Reflexes: Reflexes are normal and symmetric.  Psychiatric:        Behavior: Behavior normal.       Assessment & Plan:  Takeshi Pitman. is a 61 y.o. male . Annual physical exam  - -anticipatory guidance as below in AVS, screening labs above. Health maintenance items as above in HPI discussed/recommended as applicable.   Benign essential HTN - Plan: losartan (COZAAR) 100 MG tablet  - Stable, tolerating current regimen. Medications refilled. Labs pending as above.   Mixed hyperlipidemia - Plan: Lipid panel  - on supplements above- updated labs ordered.   Stage 3 chronic kidney disease, unspecified whether stage 3a or 3b CKD (HCC) - Plan: CBC with Differential/Platelet, Comprehensive metabolic panel  - avoid nephrotoxins, check labs.   Controlled type 2 diabetes mellitus with stage 3 chronic kidney disease, without long-term current use of insulin (HCC) - Plan: Hemoglobin A1c, Microalbumin / creatinine urine ratio  -On herbal supplements as above, weight is down a few pounds.  Check updated A1c, microalbumin.  Exercise  discussed.  Screening for prostate cancer - Plan: PSA  Left shoulder pain, unspecified chronicity - Plan: DG Shoulder Left  -Question underlying degenerative disease with overuse accommodating for right shoulder issues.  Some signs of possible impingement/bursitis on exam.  Trial of Tylenol initially, avoid NSAIDs given CKD.  We will follow-up after x-ray to decide on possible injection versus Ortho eval.  Meds ordered this encounter  Medications   losartan (COZAAR) 100 MG tablet    Sig: Take 1 tablet (100 mg total) by mouth daily.    Dispense:  90 tablet    Refill:  1   Patient Instructions  I do recommend establishing dentist.  Dental school may be lower cost.  Try to start exercise such as walking, swimming or biking may be helpful - goal of 176min  per week.  Xray of shoulder at [facility].  Tylenol is ok if needed. Recheck in next few weeks.  No med changes for now. Take care!  [facility] for xray.  [address], [place] [address]  Preventive Care 34-78 Years Old, Male Preventive care refers to lifestyle choices and visits with your health care provider that can promote health and wellness. Preventive care visits are also called wellness exams. What can I expect for my preventive care visit? Counseling During your preventive care visit, your health care provider may ask about your: Medical history, including: Past medical problems. Family medical history. Current health, including: Emotional well-being. Home life and relationship well-being. Sexual activity. Lifestyle, including: Alcohol, nicotine or tobacco, and drug use. Access to firearms. Diet, exercise, and sleep habits. Safety issues such as seatbelt and bike helmet use. Sunscreen use. Work and work Statistician. Physical exam Your health care provider will check your: Height and weight. These may be used to calculate your BMI (body mass index). BMI is a measurement that tells if you are at a healthy weight. Waist circumference. This measures the distance around your waistline. This measurement also tells if you are at a healthy weight and may help predict your risk of certain diseases, such as type 2 diabetes and high blood pressure. Heart rate and blood pressure. Body temperature. Skin for abnormal spots. What immunizations do I need? Vaccines are usually given at various ages, according to a schedule. Your health care provider will recommend vaccines for you based on your age, medical history, and lifestyle or other factors, such as travel or where you work. What tests do I need? Screening Your health care provider may recommend screening tests for certain conditions. This may include: Lipid and cholesterol levels. Diabetes screening. This is done by  checking your blood sugar (glucose) after you have not eaten for a while (fasting). Hepatitis B test. Hepatitis C test. HIV (human immunodeficiency virus) test. STI (sexually transmitted infection) testing, if you are at risk. Lung cancer screening. Prostate cancer screening. Colorectal cancer screening. Talk with your health care provider about your test results, treatment options, and if necessary, the need for more tests. Follow these instructions at home: Eating and drinking  Eat a diet that includes fresh fruits and vegetables, whole grains, lean protein, and low-fat dairy products. Take vitamin and mineral supplements as recommended by your health care provider. Do not drink alcohol if your health care provider tells you not to drink. If you drink alcohol: Limit how much you have to 0-2 drinks a day. Know how much alcohol is in your drink. In the [place] one drink equals one 12 oz bottle of beer (  355 mL), one 5 oz glass of wine (148 mL), or one 1 oz glass of hard liquor (44 mL). Lifestyle Brush your teeth every morning and night with fluoride toothpaste. Floss one time each day. Exercise for at least 30 minutes 5 or more days each week. Do not use any products that contain nicotine or tobacco. These products include cigarettes, chewing tobacco, and vaping devices, such as e-cigarettes. If you need help quitting, ask your health care provider. Do not use drugs. If you are sexually active, practice safe sex. Use a condom or other form of protection to prevent STIs. Take aspirin only as told by your health care provider. Make sure that you understand how much to take and what form to take. Work with your health care provider to find out whether it is safe and beneficial for you to take aspirin daily. Find healthy ways to manage stress, such as: Meditation, yoga, or listening to music. Journaling. Talking to a trusted person. Spending time with friends and family. Minimize exposure to  UV radiation to reduce your risk of skin cancer. Safety Always wear your seat belt while driving or riding in a vehicle. Do not drive: If you have been drinking alcohol. Do not ride with someone who has been drinking. When you are tired or distracted. While texting. If you have been using any mind-altering substances or drugs. Wear a helmet and other protective equipment during sports activities. If you have firearms in your house, make sure you follow all gun safety procedures. What's next? Go to your health care provider once a year for an annual wellness visit. Ask your health care provider how often you should have your eyes and teeth checked. Stay up to date on all vaccines. This information is not intended to replace advice given to you by your health care provider. Make sure you discuss any questions you have with your health care provider. Document Revised: 08/16/2020 Document Reviewed: 08/16/2020 Elsevier Patient Education  [place].   Shoulder Pain Many things can cause shoulder pain, including: An injury to the shoulder. Overuse of the shoulder. Arthritis. The source of the pain can be: Inflammation. An injury to the shoulder joint. An injury to a tendon, ligament, or bone. Follow these instructions at home: Pay attention to changes in your symptoms. Let your health care provider know about them. Follow these instructions to relieve your pain. If you have a sling: Wear the sling as told by your health care provider. Remove it only as told by your health care provider. Loosen the sling if your fingers tingle, become numb, or turn cold and blue. Keep the sling clean. If the sling is not waterproof: Do not let it get wet. Remove it to shower or bathe. Move your arm as little as possible, but keep your hand moving to prevent swelling. Managing pain, stiffness, and swelling  If directed, put ice on the painful area: Put ice in a plastic bag. Place a towel  between your skin and the bag. Leave the ice on for 20 minutes, 2-3 times per day. Stop applying ice if it does not help with the pain. Squeeze a soft ball or a foam pad as much as possible. This helps to keep the shoulder from swelling. It also helps to strengthen the arm. General instructions Take over-the-counter and prescription medicines only as told by your health care provider. Keep all follow-up visits as told by your health care provider. This is important. Contact a health care provider  if: Your pain gets worse. Your pain is not relieved with medicines. New pain develops in your arm, hand, or fingers. Get help right away if: Your arm, hand, or fingers: Tingle. Become numb. Become swollen. Become painful. Turn white or blue. Summary Shoulder pain can be caused by an injury, overuse, or arthritis. Pay attention to changes in your symptoms. Let your health care provider know about them. This condition may be treated with a sling, ice, and pain medicines. Contact your health care provider if the pain gets worse or new pain develops. Get help right away if your arm, hand, or fingers tingle or become numb, swollen, or painful. Keep all follow-up visits as told by your health care provider. This is important. This information is not intended to replace advice given to you by your health care provider. Make sure you discuss any questions you have with your health care provider. Document Revised: 09/02/2017 Document Reviewed: 09/02/2017 Elsevier Patient Education  2022 [place],   Merri Ray, MD [facility] 05/04/21 8:48 AM

## 2021-05-04 NOTE — Patient Instructions (Addendum)
I do recommend establishing dentist.  Dental school may be lower cost.  ?Try to start exercise such as walking, swimming or biking may be helpful - goal of per week.  ?Xray of shoulder at [facility].  Tylenol is ok if needed. Recheck in next few weeks.  ?No med changes for now. Take care! ? ?[facility] for xray.  ?[address]  ?[place], [place] [address] ? ?Preventive Care 80-60 Years Old, Male ?Preventive care refers to lifestyle choices and visits with your health care provider that can promote health and wellness. Preventive care visits are also called wellness exams. ?What can I expect for my preventive care visit? ?Counseling ?During your preventive care visit, your health care provider may ask about your: ?Medical history, including: ?Past medical problems. ?Family medical history. ?Current health, including: ?Emotional well-being. ?Home life and relationship well-being. ?Sexual activity. ?Lifestyle, including: ?Alcohol, nicotine or tobacco, and drug use. ?Access to firearms. ?Diet, exercise, and sleep habits. ?Safety issues such as seatbelt and bike helmet use. ?Sunscreen use. ?Work and work Astronomer. ?Physical exam ?Your health care provider will check your: ?Height and weight. These may be used to calculate your BMI (body mass index). BMI is a measurement that tells if you are at a healthy weight. ?Waist circumference. This measures the distance around your waistline. This measurement also tells if you are at a healthy weight and may help predict your risk of certain diseases, such as type 2 diabetes and high blood pressure. ?Heart rate and blood pressure. ?Body temperature. ?Skin for abnormal spots. ?What immunizations do I need? ?Vaccines are usually given at various ages, according to a schedule. Your health care provider will recommend vaccines for you based on your age, medical history, and lifestyle or other factors, such as travel or where you work. ?What tests do I need? ?Screening ?Your  health care provider may recommend screening tests for certain conditions. This may include: ?Lipid and cholesterol levels. ?Diabetes screening. This is done by checking your blood sugar (glucose) after you have not eaten for a while (fasting). ?Hepatitis B test. ?Hepatitis C test. ?HIV (human immunodeficiency virus) test. ?STI (sexually transmitted infection) testing, if you are at risk. ?Lung cancer screening. ?Prostate cancer screening. ?Colorectal cancer screening. ?Talk with your health care provider about your test results, treatment options, and if necessary, the need for more tests. ?Follow these instructions at home: ?Eating and drinking ? ?Eat a diet that includes fresh fruits and vegetables, whole grains, lean protein, and low-fat dairy products. ?Take vitamin and mineral supplements as recommended by your health care provider. ?Do not drink alcohol if your health care provider tells you not to drink. ?If you drink alcohol: ?Limit how much you have to 0-2 drinks a day. ?Know how much alcohol is in your drink. In the [place] one drink equals one 12 oz bottle of beer (355 mL), one 5 oz glass of wine (148 mL), or one 1? oz glass of hard liquor (44 mL). ?Lifestyle ?Brush your teeth every morning and night with fluoride toothpaste. Floss one time each day. ?Exercise for at least 30 minutes 5 or more days each week. ?Do not use any products that contain nicotine or tobacco. These products include cigarettes, chewing tobacco, and vaping devices, such as e-cigarettes. If you need help quitting, ask your health care provider. ?Do not use drugs. ?If you are sexually active, practice safe sex. Use a condom or other form of protection to prevent STIs. ?Take aspirin only as told by  your health care provider. Make sure that you understand how much to take and what form to take. Work with your health care provider to find out whether it is safe and beneficial for you to take aspirin daily. ?Find healthy ways to manage  stress, such as: ?Meditation, yoga, or listening to music. ?Journaling. ?Talking to a trusted person. ?Spending time with friends and family. ?Minimize exposure to UV radiation to reduce your risk of skin cancer. ?Safety ?Always wear your seat belt while driving or riding in a vehicle. ?Do not drive: ?If you have been drinking alcohol. Do not ride with someone who has been drinking. ?When you are tired or distracted. ?While texting. ?If you have been using any mind-altering substances or drugs. ?Wear a helmet and other protective equipment during sports activities. ?If you have firearms in your house, make sure you follow all gun safety procedures. ?What's next? ?Go to your health care provider once a year for an annual wellness visit. ?Ask your health care provider how often you should have your eyes and teeth checked. ?Stay up to date on all vaccines. ?This information is not intended to replace advice given to you by your health care provider. Make sure you discuss any questions you have with your health care provider. ?Document Revised: 08/16/2020 Document Reviewed: 08/16/2020 ?Elsevier Patient Education ? 2022 Elsevier Inc. ? ? ?Shoulder Pain ?Many things can cause shoulder pain, including: ?An injury to the shoulder. ?Overuse of the shoulder. ?Arthritis. ?The source of the pain can be: ?Inflammation. ?An injury to the shoulder joint. ?An injury to a tendon, ligament, or bone. ?Follow these instructions at home: ?Pay attention to changes in your symptoms. Let your health care provider know about them. Follow these instructions to relieve your pain. ?If you have a sling: ?Wear the sling as told by your health care provider. Remove it only as told by your health care provider. ?Loosen the sling if your fingers tingle, become numb, or turn cold and blue. ?Keep the sling clean. ?If the sling is not waterproof: ?Do not let it get wet. Remove it to shower or bathe. ?Move your arm as little as possible, but keep your  hand moving to prevent swelling. ?Managing pain, stiffness, and swelling ? ?If directed, put ice on the painful area: ?Put ice in a plastic bag. ?Place a towel between your skin and the bag. ?Leave the ice on for 20 minutes, 2-3 times per day. Stop applying ice if it does not help with the pain. ?Squeeze a soft ball or a foam pad as much as possible. This helps to keep the shoulder from swelling. It also helps to strengthen the arm. ?General instructions ?Take over-the-counter and prescription medicines only as told by your health care provider. ?Keep all follow-up visits as told by your health care provider. This is important. ?Contact a health care provider if: ?Your pain gets worse. ?Your pain is not relieved with medicines. ?New pain develops in your arm, hand, or fingers. ?Get help right away if: ?Your arm, hand, or fingers: ?Tingle. ?Become numb. ?Become swollen. ?Become painful. ?Turn white or blue. ?Summary ?Shoulder pain can be caused by an injury, overuse, or arthritis. ?Pay attention to changes in your symptoms. Let your health care provider know about them. ?This condition may be treated with a sling, ice, and pain medicines. ?Contact your health care provider if the pain gets worse or new pain develops. Get help right away if your arm, hand, or fingers tingle or become numb,  swollen, or painful. ?Keep all follow-up visits as told by your health care provider. This is important. ?This information is not intended to replace advice given to you by your health care provider. Make sure you discuss any questions you have with your health care provider. ?Document Revised: 09/02/2017 Document Reviewed: 09/02/2017 ?Elsevier Patient Education ? 2022 Elsevier Inc. ? ? ? ?

## 2021-05-15 ENCOUNTER — Encounter: Payer: Self-pay | Admitting: Family Medicine

## 2021-05-15 DIAGNOSIS — E782 Mixed hyperlipidemia: Secondary | ICD-10-CM

## 2021-05-15 MED ORDER — SIMVASTATIN 20 MG PO TABS: 20.0000 mg | ORAL_TABLET | Freq: Every day | ORAL | 1 refills | Status: DC

## 2021-05-15 NOTE — Telephone Encounter (Signed)
Pt is asking if trying simvastatin again is an option noted tried many years ago and was successful in keeping numbers down but was taken off of this by Dr Tanya Nones.  ? ?Please advise  ?

## 2021-05-24 ENCOUNTER — Ambulatory Visit: Payer: 59 | Admitting: Family Medicine

## 2021-05-24 ENCOUNTER — Encounter: Payer: Self-pay | Admitting: Family Medicine

## 2021-05-24 VITALS — BP 116/74 | HR 87 | Temp 98.1°F | Resp 17 | Ht 71.0 in | Wt 222.2 lb

## 2021-05-24 DIAGNOSIS — I10 Essential (primary) hypertension: Secondary | ICD-10-CM

## 2021-05-24 DIAGNOSIS — E782 Mixed hyperlipidemia: Secondary | ICD-10-CM

## 2021-05-24 DIAGNOSIS — M25512 Pain in left shoulder: Secondary | ICD-10-CM

## 2021-05-24 MED ORDER — CARVEDILOL 6.25 MG PO TABS: 6.2500 mg | ORAL_TABLET | Freq: Two times a day (BID) | ORAL | 3 refills | Status: DC

## 2021-05-24 NOTE — Progress Notes (Signed)
? ?Subjective:  ?Patient ID: Shawn Lanius., male    DOB: 1960/09/12  Age: 61 y.o. MRN: [id number] ? ?CC:  ?Chief Complaint  ?Patient presents with  ? Shoulder Pain  ?  Recheck on shoulder pain from 2 weeks ago, notes still pops and has continued to be painful   ? ? ?HPI ?Shawn Chatters. presents for  ? ?Left shoulder pain ?Discussed at his last visit March 3.  Initial Tylenol, avoiding NSAIDs given CKD.  Imaging obtained March 3 with no acute fracture dislocation, degenerative changes noted in the glenohumeral and AC joint with slight space narrowing.  Consistent with osteoarthritis. ?Started otc supplement - seems to help. Grass fed whole bone, collagen and green shelled mussel.  ?Pain is better. Declines referral  ? ?Hyperlipidemia with diabetes ?Elevated labs noted at recent visit.  Statin recommended. ?Started simvastatin 20mg  qd - no new myalgias or side effects.  ?Lab Results  ?Component Value Date  ? CHOL 218 (H) 05/04/2021  ? HDL 31.70 (L) 05/04/2021  ? [place] 72 05/12/2020  ? LDLDIRECT 136.0 05/04/2021  ? TRIG 222.0 (H) 05/04/2021  ? CHOLHDL 7 05/04/2021  ? ?Lab Results  ?Component Value Date  ? ALT 31 05/04/2021  ? AST 21 05/04/2021  ? ALKPHOS 65 05/04/2021  ? BILITOT 0.6 05/04/2021  ? ?Hypertension: ?Coreg 12.5mg  BID. Losartan 100mg  qd.  ?Home readings: 110/75 last night - skipped last night's dose.  ?BP Readings from Last 3 Encounters:  ?05/24/21 116/74  ?05/04/21 128/78  ?12/22/20 138/84  ? ?Lab Results  ?Component Value Date  ? CREATININE 1.13 05/04/2021  ? ? ? ? ?History ?Patient Active Problem List  ? Diagnosis Date Noted  ? Acute viral conjunctivitis of right eye 08/28/2020  ? Routine general medical examination at a health care facility 05/01/2020  ? Hyperlipidemia LDL goal <70 03/15/2020  ? Hypertriglyceridemia 03/15/2020  ? Diabetes mellitus without complication ([place])   ? Benign essential HTN 08/25/2019  ? ?Past Medical History:  ?Diagnosis Date  ? Allergy   ? Arthritis   ? Chronic  bronchitis   ? Chronic pain   ? per pt/ right shoulder pain, left leg and ankle  ? CKD (chronic kidney disease) stage 3, GFR 30-59 ml/min ([facility])   ? Costochondral chest pain   ? due to torn tendons.  ? CTS (carpal tunnel syndrome)   ? right hand  ? Diabetes mellitus without complication ([place])   ? prediabetes/ no meds  ? Diverticulitis   ? GERD (gastroesophageal reflux disease)   ? History of anal fissures   ? Hyperlipemia   ? Hypertension   ? Rectal bleeding   ? occasional  ? Seasonal allergies   ? ?Past Surgical History:  ?Procedure Laterality Date  ? ANKLE FRACTURE SURGERY  2005 / 2007  ? left ankle and  left leg/ have screws in leg and ankle  ? CARPAL TUNNEL RELEASE Left 07/29/2019  ? Procedure: CARPAL TUNNEL RELEASE;  Surgeon: Daryll Brod, MD;  Location: [facility];  Service: Orthopedics;  Laterality: Left;  FOREARM BLOCK  ? EXAMINATION UNDER ANESTHESIA N/A 12/15/2012  ? Procedure: EXAM UNDER ANESTHESIA;  Surgeon: Gayland Curry, MD;  Location: [facility];  Service: General;  Laterality: N/A;  ? FRACTURE SURGERY  2005 and 2007  ? lt ankle x2  ? HEMORRHOIDECTOMY WITH HEMORRHOID BANDING N/A 12/15/2012  ? Procedure: EXCISIONAL HEMORRHOIDECTOMY WITH HEMORRHOID BANDING;  Surgeon: Gayland Curry, MD;  Location: [facility];  Service: General;  Laterality: N/A;  ? rotator cuff surgery     ? x 2/ right shoulder  ? TRIGGER FINGER RELEASE  07/23/2011  ? Procedure: Right hand- RELEASE TRIGGER FINGER/A-1 PULLEY;  Surgeon: Cammie Sickle., MD;  Location: [facility];  Service: Orthopedics;  Laterality: Right;  ? WRIST FRACTURE SURGERY  1984  ? lt with bone graft  ? WRIST SURGERY    ? left wrist  ? ?Allergies  ?Allergen Reactions  ? Amlodipine Other (See Comments)  ?  Per pt - damage to kidney's  ? Codeine Nausea And Vomiting  ? Ivp Dye [Iodinated Contrast Media]   ?  Nauseated  ? Penicillins Nausea And Vomiting  ?  As a child  ? Sulfa Antibiotics   ?  nauseated   ? ?Prior to Admission medications   ?Medication Sig Start Date End Date Taking? Authorizing Provider  ?ASPIRIN 81 PO Take 1 tablet by mouth daily.   Yes [provider]  ?Bethena Midget (BERBERINE COMPLEX PO) Take 1,200 mg by mouth. 1200mg  of HCI and 200mg  of organic cinnamon bark   Yes [provider]  ?Biotin 5000 MCG CAPS 2 times a week 12/03/14  Yes [provider]  ?carvedilol (COREG) 12.5 MG tablet TAKE 1 TABLET BY MOUTH TWICE DAILY WITH A MEAL 12/22/20  Yes Wendie Agreste, MD  ?cetirizine (ZYRTEC) 10 MG tablet Take 1 tablet (10 mg total) by mouth daily. 10/28/19  Yes Susy Frizzle, MD  ?CINNAMON PO 2,000 mg. 10/02/20  Yes [provider]  ?Collagen Hydrolysate, Bovine, POWD by Does not apply route. Takes Psychologist, counselling" for arthritis   Yes [provider]  ?FIBER COMPLETE PO Take by mouth. Vita Fusion fiber gummies   Yes [provider]  ?fluticasone (FLONASE) 50 MCG/ACT nasal spray Place 2 sprays into both nostrils daily. 04/27/18  Yes Susy Frizzle, MD  ?Garlic Oil 123XX123 MG CAPS  10/02/20  Yes [provider]  ?Glucosamine-Chondroitin-MSM 500-200-150 MG TABS Take by mouth.   Yes [provider]  ?L-Lysine 1000 MG TABS Take by mouth.   Yes [provider]  ?L-Lysine 1000 MG TABS  03/04/12  Yes [provider]  ?Lancets (FREESTYLE) lancets Use as instructed 10/28/19  Yes Susy Frizzle, MD  ?losartan (COZAAR) 100 MG tablet Take 1 tablet (100 mg total) by mouth daily. 05/04/21  Yes Wendie Agreste, MD  ?MAGNESIUM OXIDE PO Take 375 mg by mouth daily.   Yes [provider]  ?Multiple Vitamins-Minerals (Cash)  06/02/17  Yes [provider]  ?NON FORMULARY Take 500 mg by mouth daily. Mangosteen   Yes [provider]  ?NON FORMULARY Ceylon Cinnamon multiple tablets of this kind as well   Yes [provider]  ?Nutritional Supplements (GRAPESEED EXTRACT)  500-50 MG CAPS  09/01/20  Yes [provider]  ?omega-3 acid ethyl esters (LOVAZA) 1 g capsule Take 2 capsules (2 g total) by mouth 2 (two) times daily. 03/15/21  Yes Wendie Agreste, MD  ?omeprazole (PRILOSEC) 20 MG capsule Take 1 capsule (20 mg total) by mouth 2 (two) times daily before a meal. 11/21/20  Yes Wendie Agreste, MD  ?[facility] VERIO test strip CHECK BLOOD SUGARS IN THE MORNING AND AT BEDTIME. 09/13/20  Yes Janith Lima, MD  ?[place] 8000mg  09/01/20  Yes [provider]  ?simvastatin (ZOCOR) 20 MG tablet Take 1 tablet (20 mg total) by mouth  at bedtime. 05/15/21  Yes Wendie Agreste, MD  ? ?Social History  ? ?Socioeconomic History  ? Marital status: Divorced  ?  Spouse name: Not on file  ? Number of children: 1  ? Years of education: Not on file  ? Highest education level: Not on file  ?Occupational History  ? Occupation: disabled  ?Tobacco Use  ? Smoking status: Never  ? Smokeless tobacco: Former  ?  Types: Chew  ? Tobacco comments:  ?  Quit age 64 y.o  ?Vaping Use  ? Vaping Use: Never used  ?Substance and Sexual Activity  ? Alcohol use: No  ? Drug use: No  ? Sexual activity: Not Currently  ?Other Topics Concern  ? Not on file  ?Social History Narrative  ? Not on file  ? ?Social Determinants of Health  ? ?Financial Resource Strain: Not on file  ?Food Insecurity: Not on file  ?Transportation Needs: Not on file  ?Physical Activity: Not on file  ?Stress: Not on file  ?Social Connections: Not on file  ?Intimate Partner Violence: Not on file  ? ? ?Review of Systems ? ? ?Objective:  ? ?Vitals:  ? 05/24/21 1427  ?BP: 116/74  ?Pulse: 87  ?Resp: 17  ?Temp: 98.1 ?F (36.7 ?C)  ?TempSrc: Temporal  ?SpO2: 96%  ?Weight: 222 lb 3.2 oz (100.8 kg)  ?Height: 5\' 11"  (1.803 m)  ? ? ? ?Physical Exam ?Vitals reviewed.  ?Constitutional:   ?   Appearance: He is well-developed.  ?HENT:  ?   Head: Normocephalic and atraumatic.  ?Neck:  ?   Vascular: No carotid bruit or JVD.   ?Cardiovascular:  ?   Rate and Rhythm: Normal rate and regular rhythm.  ?   Heart sounds: Normal heart sounds. No murmur heard. ?Pulmonary:  ?   Effort: Pulmonary effort is normal.  ?   Breath sounds: Normal breath sou

## 2021-05-24 NOTE — Patient Instructions (Addendum)
I can not recommend for or against the supplement you are using for your shoulder. Glad to hear you are better. If pain returns, I recommend meeting with a shoulder specialist. Let me know and I am happy to place that referral.  Tylenol if needed is ok.  ? ? visit for blood pressure, cholesterol and liver function in 1 month.  ? ?Keep a record of your blood pressures outside of the office and of over 140/90 on new dose coreg, return to prior dose.  ? ? ?

## 2021-06-25 ENCOUNTER — Ambulatory Visit (INDEPENDENT_AMBULATORY_CARE_PROVIDER_SITE_OTHER): Payer: 59 | Admitting: Family Medicine

## 2021-06-25 ENCOUNTER — Encounter: Payer: Self-pay | Admitting: Family Medicine

## 2021-06-25 DIAGNOSIS — E782 Mixed hyperlipidemia: Secondary | ICD-10-CM | POA: Diagnosis not present

## 2021-06-25 DIAGNOSIS — I10 Essential (primary) hypertension: Secondary | ICD-10-CM

## 2021-06-25 DIAGNOSIS — M25512 Pain in left shoulder: Secondary | ICD-10-CM | POA: Diagnosis not present

## 2021-06-25 LAB — LDL CHOLESTEROL, DIRECT: Direct LDL: 75 mg/dL

## 2021-06-25 LAB — COMPREHENSIVE METABOLIC PANEL
ALT: 37 U/L (ref 0–53)
AST: 22 U/L (ref 0–37)
Albumin: 4.4 g/dL (ref 3.5–5.2)
Alkaline Phosphatase: 67 U/L (ref 39–117)
BUN: 18 mg/dL (ref 6–23)
CO2: 24 mEq/L (ref 19–32)
Calcium: 9.5 mg/dL (ref 8.4–10.5)
Chloride: 103 mEq/L (ref 96–112)
Creatinine, Ser: 1.01 mg/dL (ref 0.40–1.50)
GFR: 81.45 mL/min (ref >60.00)
Glucose, Bld: 140 mg/dL — ABNORMAL HIGH (ref 70–99)
Potassium: 4.2 mEq/L (ref 3.5–5.1)
Sodium: 138 mEq/L (ref 135–145)
Total Bilirubin: 0.5 mg/dL (ref 0.2–1.2)
Total Protein: 6.8 g/dL (ref 6.0–8.3)

## 2021-06-25 LAB — LIPID PANEL
Cholesterol: 141 mg/dL (ref 0–200)
HDL: 29.6 mg/dL — ABNORMAL LOW (ref >39.00)
Total CHOL/HDL Ratio: 5
Triglycerides: 414 mg/dL — ABNORMAL HIGH (ref 0.0–149.0)

## 2021-06-25 NOTE — Patient Instructions (Addendum)
If pain in shoulder is not improving, let me know and I can refer you to orthopaedics to discuss next steps.  ?I will check labs today, but if elevated will need to repeat after fasting 6-8 hours.  ? ? ? ? ?

## 2021-06-25 NOTE — Progress Notes (Signed)
? ?Subjective:  ?Patient ID: Shawn Lappe., male    DOB: [birth year]  Age: 61 y.o. MRN: [id number] ? ?CC:  ?Chief Complaint  ?Patient presents with  ? Hyperlipidemia  ?  Pt here for 1 month   ? Hypertension  ?  Advised follow up 1 month   ? Shoulder Pain  ?  Lt shoulder pt notes started a new supplement rather than seeing Ortho due to being nervous about injections and other therapies, pt notes Shawn Ball has been doing better since last visit   ? ? ?HPI ?Shawn Ball. presents for  ? ?Left shoulder pain: ?Improvement last visit, option to meet with Ortho if symptoms returned.  Doing better with supplement use.  Discussed last visit. Shawn Ball added another supplement with glucosamine chondroitin. DJD noted on prior XR, OA.  ? ?Hypertension: ?Well-controlled at his last visit, we decreased his dose of carvedilol to 6.25 mg twice daily.  Continue same dose of losartan given history of microalbuminuria. ?Home readings: 120/70 range. Feel well on current meds.  ?BP Readings from Last 3 Encounters:  ?06/25/21 122/78  ?05/24/21 116/74  ?05/04/21 128/78  ? ?Lab Results  ?Component Value Date  ? CREATININE 1.13 05/04/2021  ? ?Hyperlipidemia ?Plan for repeat lipids, CMP today.  Continued on same dose of simvastatin last visit.  Started in March with elevated readings, previously treated with Garlique, Lovaza.  ?No side effects on daily dosing.  ?Last ate 4 hrs ago. ? ? ? ?History ?Patient Active Problem List  ? Diagnosis Date Noted  ? Acute viral conjunctivitis of right eye 08/28/2020  ? Routine general medical examination at a health care facility 05/01/2020  ? Hyperlipidemia LDL goal <70 03/15/2020  ? Hypertriglyceridemia 03/15/2020  ? Diabetes mellitus without complication ([place])   ? Benign essential HTN 08/25/2019  ? ?Past Medical History:  ?Diagnosis Date  ? Allergy   ? Arthritis   ? Chronic bronchitis   ? Chronic pain   ? per pt/ right shoulder pain, left leg and ankle  ? CKD (chronic kidney disease) stage 3, GFR 30-59  ml/min (HCC)   ? Costochondral chest pain   ? due to torn tendons.  ? CTS (carpal tunnel syndrome)   ? right hand  ? Diabetes mellitus without complication ([place])   ? prediabetes/ no meds  ? Diverticulitis   ? GERD (gastroesophageal reflux disease)   ? History of anal fissures   ? Hyperlipemia   ? Hypertension   ? Rectal bleeding   ? occasional  ? Seasonal allergies   ? ?Past Surgical History:  ?Procedure Laterality Date  ? ANKLE FRACTURE SURGERY  2005 / 2007  ? left ankle and  left leg/ have screws in leg and ankle  ? CARPAL TUNNEL RELEASE Left 07/29/2019  ? Procedure: CARPAL TUNNEL RELEASE;  Surgeon: Daryll Brod, MD;  Location: [place];  Service: Orthopedics;  Laterality: Left;  FOREARM BLOCK  ? EXAMINATION UNDER ANESTHESIA N/A 12/15/2012  ? Procedure: EXAM UNDER ANESTHESIA;  Surgeon: Gayland Curry, MD;  Location: [place];  Service: General;  Laterality: N/A;  ? FRACTURE SURGERY  2005 and 2007  ? lt ankle x2  ? HEMORRHOIDECTOMY WITH HEMORRHOID BANDING N/A 12/15/2012  ? Procedure: EXCISIONAL HEMORRHOIDECTOMY WITH HEMORRHOID BANDING;  Surgeon: Gayland Curry, MD;  Location: [place];  Service: General;  Laterality: N/A;  ? rotator cuff surgery     ? x 2/ right shoulder  ? TRIGGER FINGER RELEASE  07/23/2011  ? Procedure: Right hand- RELEASE TRIGGER FINGER/A-1 PULLEY;  Surgeon: Cammie Sickle., MD;  Location: [place];  Service: Orthopedics;  Laterality: Right;  ? WRIST FRACTURE SURGERY  1984  ? lt with bone graft  ? WRIST SURGERY    ? left wrist  ? ?Allergies  ?Allergen Reactions  ? Amlodipine Other (See Comments)  ?  Per pt - damage to kidney's  ? Codeine Nausea And Vomiting  ? Ivp Dye [Iodinated Contrast Media]   ?  Nauseated  ? Penicillins Nausea And Vomiting  ?  As a child  ? Sulfa Antibiotics   ?  nauseated  ? ?Prior to Admission medications   ?Medication Sig Start Date End Date Taking? Authorizing Provider  ?ASPIRIN 81 PO Take 1 tablet by  mouth daily.    [provider]  ?Bethena Midget (BERBERINE COMPLEX PO) Take 1,200 mg by mouth. 1200mg  of HCI and 200mg  of organic cinnamon bark    [provider]  ?Biotin 5000 MCG CAPS 2 times a week 12/03/14   [provider]  ?carvedilol (COREG) 6.25 MG tablet Take 1 tablet (6.25 mg total) by mouth 2 (two) times daily with a meal. 05/24/21   Wendie Agreste, MD  ?cetirizine (ZYRTEC) 10 MG tablet Take 1 tablet (10 mg total) by mouth daily. 10/28/19   Susy Frizzle, MD  ?CINNAMON PO 2,000 mg. 10/02/20   [provider]  ?Collagen Hydrolysate, Bovine, POWD by Does not apply route. Takes Psychologist, counselling" for arthritis    [provider]  ?FIBER COMPLETE PO Take by mouth. Vita Fusion fiber gummies    [provider]  ?fluticasone (FLONASE) 50 MCG/ACT nasal spray Place 2 sprays into both nostrils daily. 04/27/18   Susy Frizzle, MD  ?Garlic Oil 123XX123 MG CAPS  10/02/20   [provider]  ?Glucosamine-Chondroitin-MSM 500-200-150 MG TABS Take by mouth.    [provider]  ?L-Lysine 1000 MG TABS Take by mouth.    [provider]  ?L-Lysine 1000 MG TABS  03/04/12   [provider]  ?Lancets (FREESTYLE) lancets Use as instructed 10/28/19   Susy Frizzle, MD  ?losartan (COZAAR) 100 MG tablet Take 1 tablet (100 mg total) by mouth daily. 05/04/21   Wendie Agreste, MD  ?MAGNESIUM OXIDE PO Take 375 mg by mouth daily.    [provider]  ?Multiple Vitamins-Minerals ([place])  06/02/17   [provider]  ?NON FORMULARY Take 500 mg by mouth daily. Mangosteen    [provider]  ?NON FORMULARY Ceylon Cinnamon multiple tablets of this kind as well    [provider]  ?Nutritional Supplements (GRAPESEED EXTRACT) 500-50 MG CAPS  09/01/20   [provider]  ?omega-3 acid ethyl esters (LOVAZA) 1 g capsule Take 2 capsules (2 g total) by mouth 2 (two) times daily. 03/15/21    Wendie Agreste, MD  ?omeprazole (PRILOSEC) 20 MG capsule Take 1 capsule (20 mg total) by mouth 2 (two) times daily before a meal. 11/21/20   Wendie Agreste, MD  ?[facility] VERIO test strip CHECK BLOOD SUGARS IN THE MORNING AND AT BEDTIME. 09/13/20   Janith Lima, MD  ?[place] 8000mg  09/01/20   [provider]  ?simvastatin (ZOCOR) 20 MG tablet Take 1 tablet (20 mg total) by mouth at bedtime. 05/15/21   Wendie Agreste, MD  ? ?Social History  ? ?Socioeconomic History  ? Marital  status: Divorced  ?  Spouse name: Not on file  ? Number of children: 1  ? Years of education: Not on file  ? Highest education level: Not on file  ?Occupational History  ? Occupation: disabled  ?Tobacco Use  ? Smoking status: Never  ? Smokeless tobacco: Former  ?  Types: Chew  ? Tobacco comments:  ?  Quit age 66 y.o  ?Vaping Use  ? Vaping Use: Never used  ?Substance and Sexual Activity  ? Alcohol use: No  ? Drug use: No  ? Sexual activity: Not Currently  ?Other Topics Concern  ? Not on file  ?Social History Narrative  ? Not on file  ? ?Social Determinants of Health  ? ?Financial Resource Strain: Not on file  ?Food Insecurity: Not on file  ?Transportation Needs: Not on file  ?Physical Activity: Not on file  ?Stress: Not on file  ?Social Connections: Not on file  ?Intimate Partner Violence: Not on file  ? ? ?Review of Systems ? ? ?Objective:  ? ?Vitals:  ? 06/25/21 1309  ?BP: 122/78  ?Pulse: 76  ?Resp: 16  ?Temp: 98 ?F (36.7 ?C)  ?TempSrc: Temporal  ?SpO2: 98%  ?Weight: 222 lb 9.6 oz (101 kg)  ?Height: 5\' 11"  (1.803 m)  ? ? ? ?Physical Exam ?Vitals reviewed.  ?Constitutional:   ?   Appearance: Shawn Ball is well-developed.  ?HENT:  ?   Head: Normocephalic and atraumatic.  ?Neck:  ?   Vascular: No carotid bruit or JVD.  ?Cardiovascular:  ?   Rate and Rhythm: Normal rate and regular rhythm.  ?   Heart sounds: Normal heart sounds. No murmur heard. ?Pulmonary:  ?   Effort: Pulmonary effort is normal.  ?   Breath  sounds: Normal breath sounds. No rales.  ?Musculoskeletal:  ?   Right lower leg: No edema.  ?   Left lower leg: No edema.  ?   Comments: L shoulder: ?No focal bony tenderness.  Minimal decreased abduction but ot

## 2021-06-26 ENCOUNTER — Encounter: Payer: Self-pay | Admitting: Family Medicine

## 2021-06-26 DIAGNOSIS — E781 Pure hyperglyceridemia: Secondary | ICD-10-CM

## 2021-06-27 NOTE — Telephone Encounter (Signed)
Pt had recent visit and is asking about taking Niacin can we accommodate this request or should pt return for requested med  ?

## 2021-06-29 NOTE — Telephone Encounter (Signed)
Prescription increase on omega 3? Can you please send this in as pt is agreeable  ?

## 2021-06-29 NOTE — Telephone Encounter (Signed)
See notes

## 2021-07-02 NOTE — Telephone Encounter (Signed)
Pt asking about plan of action ?

## 2021-07-03 ENCOUNTER — Other Ambulatory Visit: Payer: Self-pay | Admitting: Family Medicine

## 2021-07-03 DIAGNOSIS — E781 Pure hyperglyceridemia: Secondary | ICD-10-CM

## 2021-07-03 NOTE — Progress Notes (Signed)
See my chart message

## 2021-07-06 ENCOUNTER — Other Ambulatory Visit (INDEPENDENT_AMBULATORY_CARE_PROVIDER_SITE_OTHER): Payer: 59

## 2021-07-06 DIAGNOSIS — E781 Pure hyperglyceridemia: Secondary | ICD-10-CM

## 2021-07-06 LAB — LIPID PANEL
Cholesterol: 113 mg/dL (ref 0–200)
HDL: 27.3 mg/dL — ABNORMAL LOW (ref >39.00)
LDL Cholesterol: 69 mg/dL (ref 0–99)
NonHDL: 85.84
Total CHOL/HDL Ratio: 4
Triglycerides: 84 mg/dL (ref 0.0–149.0)
VLDL: 16.8 mg/dL (ref 0.0–40.0)

## 2021-07-16 LAB — HM DIABETES EYE EXAM

## 2021-07-17 ENCOUNTER — Encounter: Payer: Self-pay | Admitting: Family Medicine

## 2021-07-19 ENCOUNTER — Other Ambulatory Visit: Payer: Self-pay | Admitting: Internal Medicine

## 2021-07-19 ENCOUNTER — Encounter: Payer: Self-pay | Admitting: Family Medicine

## 2021-07-19 ENCOUNTER — Other Ambulatory Visit: Payer: Self-pay

## 2021-07-19 DIAGNOSIS — E1122 Type 2 diabetes mellitus with diabetic chronic kidney disease: Secondary | ICD-10-CM

## 2021-07-19 MED ORDER — ONETOUCH VERIO VI STRP: ORAL_STRIP | 5 refills | Status: DC

## 2021-08-29 ENCOUNTER — Encounter: Payer: Self-pay | Admitting: Family Medicine

## 2021-08-29 ENCOUNTER — Ambulatory Visit: Payer: 59 | Admitting: Family Medicine

## 2021-08-29 VITALS — BP 132/78 | HR 71 | Temp 98.2°F | Resp 16 | Ht 71.0 in | Wt 217.2 lb

## 2021-08-29 DIAGNOSIS — I10 Essential (primary) hypertension: Secondary | ICD-10-CM | POA: Diagnosis not present

## 2021-08-29 DIAGNOSIS — T63461A Toxic effect of venom of wasps, accidental (unintentional), initial encounter: Secondary | ICD-10-CM

## 2021-08-29 DIAGNOSIS — N182 Chronic kidney disease, stage 2 (mild): Secondary | ICD-10-CM

## 2021-08-29 DIAGNOSIS — E781 Pure hyperglyceridemia: Secondary | ICD-10-CM

## 2021-08-29 DIAGNOSIS — S70361A Insect bite (nonvenomous), right thigh, initial encounter: Secondary | ICD-10-CM

## 2021-08-29 DIAGNOSIS — E782 Mixed hyperlipidemia: Secondary | ICD-10-CM

## 2021-08-29 DIAGNOSIS — K219 Gastro-esophageal reflux disease without esophagitis: Secondary | ICD-10-CM

## 2021-08-29 DIAGNOSIS — E1122 Type 2 diabetes mellitus with diabetic chronic kidney disease: Secondary | ICD-10-CM | POA: Diagnosis not present

## 2021-08-29 DIAGNOSIS — W57XXXA Bitten or stung by nonvenomous insect and other nonvenomous arthropods, initial encounter: Secondary | ICD-10-CM

## 2021-08-29 LAB — POCT GLYCOSYLATED HEMOGLOBIN (HGB A1C): Hemoglobin A1C: 6.4 % — AB (ref 4.0–5.6)

## 2021-08-29 LAB — GLUCOSE, POCT (MANUAL RESULT ENTRY): POC Glucose: 147 mg/dl — AB (ref 70–99)

## 2021-08-29 MED ORDER — OMEPRAZOLE 20 MG PO CPDR: 20.0000 mg | DELAYED_RELEASE_CAPSULE | Freq: Two times a day (BID) | ORAL | 3 refills | Status: DC

## 2021-08-29 MED ORDER — CARVEDILOL 6.25 MG PO TABS: 6.2500 mg | ORAL_TABLET | Freq: Two times a day (BID) | ORAL | 1 refills | Status: DC

## 2021-08-29 MED ORDER — LOSARTAN POTASSIUM 100 MG PO TABS: 100.0000 mg | ORAL_TABLET | Freq: Every day | ORAL | 1 refills | Status: DC

## 2021-08-29 MED ORDER — SIMVASTATIN 20 MG PO TABS: 20.0000 mg | ORAL_TABLET | Freq: Every day | ORAL | 1 refills | Status: DC

## 2021-08-29 MED ORDER — OMEGA-3-ACID ETHYL ESTERS 1 G PO CAPS: 2.0000 | ORAL_CAPSULE | Freq: Two times a day (BID) | ORAL | 6 refills | Status: DC

## 2021-08-29 NOTE — Progress Notes (Signed)
Subjective:  Patient ID: Shawn Ban., male    DOB: 06/27/1960  Age: 61 y.o. MRN: [id number]  CC:  Chief Complaint  Patient presents with   Gastroesophageal Reflux   Hypertension   Hyperlipidemia   Diabetes    Questions about sugar readings from last few days     HPI Shawn Ball. presents for   Hypertension: Well-controlled at visit earlier in the year.  Carvedilol decreased to 6.25 mg twice daily, continued on losartan 100 mg given history of microalbuminuria. Home readings:119/70's.   Takin collagen supplement as well.  BP Readings from Last 3 Encounters:  08/29/21 132/78  06/25/21 122/78  05/24/21 116/74   Lab Results  Component Value Date   CREATININE 1.01 06/25/2021   Hyperlipidemia: On simvastatin 20 mg since March,  And continued on Lovaza 2 capsules twice daily.  previously treated with Garlique, Lovaza.  He is doing well on daily dosing at his April visit.  Elevated triglycerides in April, repeat lipid panel with normal triglycerides, LDL 69 on May 5.  Lab Results  Component Value Date   CHOL 113 07/06/2021   HDL 27.30 (L) 07/06/2021   LDLCALC 69 07/06/2021   LDLDIRECT 75.0 06/25/2021   TRIG 84.0 07/06/2021   CHOLHDL 4 07/06/2021   Lab Results  Component Value Date   ALT 37 06/25/2021   AST 22 06/25/2021   ALKPHOS 67 06/25/2021   BILITOT 0.5 06/25/2021   Diabetes: With hyperglycemia, microalbuminuria. All meds previously.  Has treated with cinnamon, grapefruit seed extract, grape seed. Stopped bitter melon, diet approach.  He is on statin and ACE inhibitor. Fasting 130, evening 140-160, depending on diet.  No sx low's.  Microalbumin: 52.5 with a ratio of 37.7 on 05/04/2021 Optho, foot exam, pneumovax: Up-to-date Lab Results  Component Value Date   HGBA1C 6.4 (A) 08/29/2021   HGBA1C 6.7 (H) 05/04/2021   HGBA1C 6.6 (H) 12/22/2020   Lab Results  Component Value Date   MICROALBUR 52.5 (H) 05/04/2021   LDLCALC 69 07/06/2021    CREATININE 1.01 06/25/2021   Tick bite: Removed last night. Working in yard yesterday. Attached less than a day, no rash. No fever/HA or rash. Some chigger bites at times, itch at times. Yellow jacket sting on abdomen recently - no hives or trouble breathing. Redness on abdomen in area only.   GERD: Omeprazole QAM, then  some evenings - able to skip evening doses at times with use of probiotic. Gasx helpful at times if needed.   History Patient Active Problem List   Diagnosis Date Noted   Acute viral conjunctivitis of right eye 08/28/2020   Routine general medical examination at a health care facility 05/01/2020   Hyperlipidemia LDL goal <70 03/15/2020   Hypertriglyceridemia 03/15/2020   Diabetes mellitus without complication (HCC)    Benign essential HTN 08/25/2019   Past Medical History:  Diagnosis Date   Allergy    Arthritis    Chronic bronchitis    Chronic pain    per pt/ right shoulder pain, left leg and ankle   CKD (chronic kidney disease) stage 3, GFR 30-59 ml/min (HCC)    Costochondral chest pain    due to torn tendons.   CTS (carpal tunnel syndrome)    right hand   Diabetes mellitus without complication (HCC)    prediabetes/ no meds   Diverticulitis    GERD (gastroesophageal reflux disease)    History of anal fissures    Hyperlipemia    Hypertension  Rectal bleeding    occasional   Seasonal allergies    Past Surgical History:  Procedure Laterality Date   ANKLE FRACTURE SURGERY  2005 / 2007   left ankle and  left leg/ have screws in leg and ankle   CARPAL TUNNEL RELEASE Left 07/29/2019   Procedure: CARPAL TUNNEL RELEASE;  Surgeon: Daryll Brod, MD;  Location: [place];  Service: Orthopedics;  Laterality: Left;  FOREARM BLOCK   EXAMINATION UNDER ANESTHESIA N/A 12/15/2012   Procedure: EXAM UNDER ANESTHESIA;  Surgeon: Gayland Curry, MD;  Location: [place];  Service: General;  Laterality: N/A;   FRACTURE SURGERY  2005 and 2007    lt ankle x2   HEMORRHOIDECTOMY WITH HEMORRHOID BANDING N/A 12/15/2012   Procedure: EXCISIONAL HEMORRHOIDECTOMY WITH HEMORRHOID BANDING;  Surgeon: Gayland Curry, MD;  Location: [place];  Service: General;  Laterality: N/A;   rotator cuff surgery      x 2/ right shoulder   TRIGGER FINGER RELEASE  07/23/2011   Procedure: Right hand- RELEASE TRIGGER FINGER/A-1 PULLEY;  Surgeon: Cammie Sickle., MD;  Location: [facility];  Service: Orthopedics;  Laterality: Right;   WRIST FRACTURE SURGERY  1984   lt with bone graft   WRIST SURGERY     left wrist   Allergies  Allergen Reactions   Amlodipine Other (See Comments)    Per pt - damage to kidney's   Codeine Nausea And Vomiting   Ivp Dye [Iodinated Contrast Media]     Nauseated   Penicillins Nausea And Vomiting    As a child   Sulfa Antibiotics     nauseated   Prior to Admission medications   Medication Sig Start Date End Date Taking? Authorizing Provider  ASPIRIN 81 PO Take 1 tablet by mouth daily.   Yes [provider]  Barberry-Oreg Grape-Goldenseal (BERBERINE COMPLEX PO) Take 1,200 mg by mouth. $RemoveB'1200mg'GaPrJGvZ$  of HCI and $Remov'200mg'wBlWWC$  of organic cinnamon bark   Yes [provider]  Biotin 5000 MCG CAPS 2 times a week 12/03/14  Yes [provider]  carvedilol (COREG) 6.25 MG tablet Take 1 tablet (6.25 mg total) by mouth 2 (two) times daily with a meal. 05/24/21  Yes Wendie Agreste, MD  cetirizine (ZYRTEC) 10 MG tablet Take 1 tablet (10 mg total) by mouth daily. 10/28/19  Yes Susy Frizzle, MD  CINNAMON PO 2,000 mg. 10/02/20  Yes [provider]  Collagen Hydrolysate, Bovine, POWD by Does not apply route. Takes Psychologist, counselling" for arthritis   Yes [provider]  FIBER COMPLETE PO Take by mouth. Vita Fusion fiber gummies   Yes [provider]  fluticasone (FLONASE) 50 MCG/ACT nasal spray Place 2 sprays into both nostrils daily. 04/27/18  Yes Susy Frizzle, MD  Garlic Oil  0092 MG CAPS  10/02/20  Yes [provider]  Glucosamine-Chondroitin-MSM 500-200-150 MG TABS Take by mouth.   Yes [provider]  glucose blood (ONETOUCH VERIO) test strip CHECK BLOOD SUGARS IN THE MORNING AND AT BEDTIME. 07/19/21  Yes Wendie Agreste, MD  L-Lysine 1000 MG TABS Take by mouth.   Yes [provider]  L-Lysine 1000 MG TABS  03/04/12  Yes [provider]  Lancets (FREESTYLE) lancets Use as instructed 10/28/19  Yes Susy Frizzle, MD  losartan (COZAAR) 100 MG tablet Take 1 tablet (100 mg total) by mouth daily. 05/04/21  Yes Wendie Agreste, MD  MAGNESIUM OXIDE PO Take 375 mg  by mouth daily.   Yes [provider]  Multiple Vitamins-Minerals ([place])  06/02/17  Yes [provider]  NON FORMULARY Take 500 mg by mouth daily. Mangosteen   Yes [provider]  NON FORMULARY [place] Cinnamon multiple tablets of this kind as well   Yes [provider]  NON FORMULARY Grapefruit seed extract   Yes [provider]  Nutritional Supplements (GRAPESEED EXTRACT) 500-50 MG CAPS  09/01/20  Yes [provider]  omega-3 acid ethyl esters (LOVAZA) 1 g capsule Take 2 capsules (2 g total) by mouth 2 (two) times daily. 03/15/21  Yes Wendie Agreste, MD  omeprazole (PRILOSEC) 20 MG capsule Take 1 capsule (20 mg total) by mouth 2 (two) times daily before a meal. 11/21/20  Yes Wendie Agreste, MD  OVER THE COUNTER MEDICATION BLUEBERRY 8066m 09/01/20  Yes [provider]  simvastatin (ZOCOR) 20 MG tablet Take 1 tablet (20 mg total) by mouth at bedtime. 05/15/21  Yes GWendie Agreste MD   Social History   Socioeconomic History   Marital status: Divorced    Spouse name: Not on file   Number of children: 1   Years of education: Not on file   Highest education level: Not on file  Occupational History   Occupation: disabled  Tobacco Use   Smoking status: Never   Smokeless tobacco: Former     Types: Chew   Tobacco comments:    Quit age 61y.o  Vaping Use   Vaping Use: Never used  Substance and Sexual Activity   Alcohol use: No   Drug use: No   Sexual activity: Not Currently  Other Topics Concern   Not on file  Social History Narrative   Not on file   Social Determinants of Health   Financial Resource Strain: Not on file  Food Insecurity: Not on file  Transportation Needs: Not on file  Physical Activity: Not on file  Stress: Not on file  Social Connections: Not on file  Intimate Partner Violence: Not on file    Review of Systems Per HPI.   Objective:   Vitals:   08/29/21 0812  BP: 132/78  Pulse: 71  Resp: 16  Temp: 98.2 F (36.8 C)  TempSrc: Temporal  SpO2: 95%  Weight: 217 lb 3.2 oz (98.5 kg)  Height: _0  (1.803 m)     Physical Exam Vitals reviewed.  Constitutional:      Appearance: He is well-developed.  HENT:     Head: Normocephalic and atraumatic.  Neck:     Vascular: No carotid bruit or JVD.  Cardiovascular:     Rate and Rhythm: Normal rate and regular rhythm.     Heart sounds: Normal heart sounds. No murmur heard. Pulmonary:     Effort: Pulmonary effort is normal.     Breath sounds: Normal breath sounds. No rales.  Musculoskeletal:     Right lower leg: No edema.     Left lower leg: No edema.  Skin:    General: Skin is warm and dry.     Comments: Small erythematous area approximately 5 mm at area of previous yellowjacket sting abdomen.  No induration or discharge, no significant surrounding erythema.  No other rash.  Right thigh with small erythematous patch approximately 3 to 4 mm, location of tick removal, no foreign bodies or remnants of tick identified.  No surrounding rash or target lesion.  No induration or discharge.  Neurological:  Mental Status: He is alert and oriented to person, place, and time.  Psychiatric:        Mood and Affect: Mood normal.        Assessment & Plan:  Shawn Ball. is a 61 y.o.  male . Type 2 diabetes mellitus with stage 2 chronic kidney disease, without long-term current use of insulin ([place]) - Plan: POCT glycosylated hemoglobin (Hb A1C), POCT glucose (manual entry)  - well controlled. No changes, recheck in 6 months. Continue to watch diet.  Gastroesophageal reflux disease, unspecified whether esophagitis present - Plan: omeprazole (PRILOSEC) 20 MG capsule  - trigger avoidance discussed, continue PPI up to BID. Lowest effective dose.   Mixed hyperlipidemia - Plan: simvastatin (ZOCOR) 20 MG tablet Hypertriglyceridemia - Plan: omega-3 acid ethyl esters (LOVAZA) 1 g capsule  - prior stable on lovaza, zocor - continue same.   Benign essential HTN - Plan: losartan (COZAAR) 100 MG tablet, carvedilol (COREG) 6.25 MG tablet  -  Stable, tolerating current regimen. Medications refilled. Labs noted from last visit.   Yellow jacket sting, accidental or unintentional, initial encounter  - healing, no sigh of large local reaction or concerning reaction symptoms.   Tick bite of right thigh, initial encounter  - quickly removed tick, no concerns on exam/hx. Handout given on tick bites. Rtc precautions.   Meds ordered this encounter  Medications   omeprazole (PRILOSEC) 20 MG capsule    Sig: Take 1 capsule (20 mg total) by mouth 2 (two) times daily before a meal.    Dispense:  180 capsule    Refill:  3   omega-3 acid ethyl esters (LOVAZA) 1 g capsule    Sig: Take 2 capsules (2 g total) by mouth 2 (two) times daily.    Dispense:  120 capsule    Refill:  6    Note change in quantity and refill count as pt prefers 30 day supply over 90 day   losartan (COZAAR) 100 MG tablet    Sig: Take 1 tablet (100 mg total) by mouth daily.    Dispense:  90 tablet    Refill:  1   simvastatin (ZOCOR) 20 MG tablet    Sig: Take 1 tablet (20 mg total) by mouth at bedtime.    Dispense:  90 tablet    Refill:  1   carvedilol (COREG) 6.25 MG tablet    Sig: Take 1 tablet (6.25 mg total) by mouth  2 (two) times daily with a meal.    Dispense:  180 tablet    Refill:  1   Patient Instructions   A1c looks good. No change in meds today.   If any worsening heartburn return to discuss symptoms further. Try to avoid trigger foods listed below.  Follow up in 6 months - fasting labs in 6 months.  See info on tick bites, check self for ticks after working outside daily.  Return to the clinic or go to the nearest emergency room if any of your symptoms worsen or new symptoms occur. Take care!  Type 2 Diabetes Mellitus, Self-Care, Adult Caring for yourself after you have been diagnosed with type 2 diabetes (type 2 diabetes mellitus) means keeping your blood sugar (glucose) under control with a balance of: Nutrition. Exercise. Lifestyle changes. Medicines or insulin, if needed. Support from your team of health care providers and others. What are the risks? Having type 2 diabetes can put you at risk for other long-term (chronic) conditions, such as heart disease and  kidney disease. Your health care provider may prescribe medicines to help prevent complications from diabetes. How to monitor your blood glucose  Check your blood glucose every day or as often as told by your health care provider. Have your A1C (hemoglobin A1C) level checked two or more times a year, or as often as told by your health care provider. Your health care provider will set personalized treatment goals for you. Generally, the goal of treatment is to maintain the following blood glucose levels: Before meals: 80-130 mg/dL (4.4-7.2 mmol/L). After meals: below 180 mg/dL (10 mmol/L). A1C level: less than 7%. How to manage hyperglycemia and hypoglycemia Hyperglycemia symptoms Hyperglycemia, also called high blood glucose, occurs when blood glucose is too high. Make sure you know the early signs of hyperglycemia, such as: Increased thirst. Hunger. Feeling very tired. Needing to urinate more often than usual. Blurry  vision. Hypoglycemia symptoms Hypoglycemia, also called low blood glucose, occurs with a blood glucose level at or below 70 mg/dL (3.9 mmol/L). Diabetes medicines lower your blood glucose and can cause hypoglycemia. The risk for hypoglycemia increases during or after exercise, during sleep, during illness, and when skipping meals or not eating for a long time (fasting). It is important to know the symptoms of hypoglycemia and treat it right away. Always have a 15-gram rapid-acting carbohydrate snack with you to treat low blood glucose. Family members and close friends should also know the symptoms and understand how to treat hypoglycemia, in case you are not able to treat yourself. Symptoms may include: Hunger. Anxiety. Sweating and feeling clammy. Dizziness or feeling light-headed. Sleepiness. Increased heart rate. Irritability. Tingling or numbness around the mouth, lips, or tongue. Restless sleep. Severe hypoglycemia is when your blood glucose level is at or below 54 mg/dL (3 mmol/L). Severe hypoglycemia is an emergency. Do not wait to see if the symptoms will go away. Get medical help right away. Call your local emergency services (911 in the [place] Do not drive yourself to the hospital. If you have severe hypoglycemia and you cannot eat or drink, you may need glucagon. A family member or close friend should learn how to check your blood glucose and how to give you glucagon. Ask your health care provider if you need to have an emergency glucagon kit available. Follow these instructions at home: Medicines Take prescribed insulin or diabetes medicines as told by your health care provider. Do not run out of insulin or other diabetes medicines. Plan ahead so you always have these available. If you use insulin, adjust your dosage based on your physical activity and what foods you eat. Your health care provider will tell you how to adjust your dosage. Take over-the-counter and prescription  medicines only as told by your health care provider. Eating and drinking  What you eat and drink affects your blood glucose and your insulin dosage. Making good choices helps to control your diabetes and prevent other health problems. A healthy meal plan includes eating lean proteins, complex carbohydrates, fresh fruits and vegetables, low-fat dairy products, and healthy fats. Make an appointment to see a registered dietitian to help you create an eating plan that is right for you. Make sure that you: Follow instructions from your health care provider about eating or drinking restrictions. Drink enough fluid to keep your urine pale yellow. Keep a record of the carbohydrates that you eat. Do this by reading food labels and learning the standard serving sizes of foods. Follow your sick-day plan whenever you cannot eat or drink  as usual. Make this plan in advance with your health care provider.  Activity Stay active. Exercise regularly, as told by your health care provider. This may include: Stretching and doing strength exercises, such as yoga or weight lifting, two or more times a week. Doing 150 minutes or more of moderate-intensity or vigorous-intensity exercise each week. This could be brisk walking, biking, or water aerobics. Spread out your activity over 3 or more days of the week. Do not go more than 2 days in a row without doing some kind of physical activity. When you start a new exercise or activity, work with your health care provider to adjust your insulin, medicines, or food intake as needed. Lifestyle Do not use any products that contain nicotine or tobacco. These products include cigarettes, chewing tobacco, and vaping devices, such as e-cigarettes. If you need help quitting, ask your health care provider. If you drink alcohol and your health care provider says that it is safe for you: Limit how much you have to: 0-1 drink a day for women who are not pregnant. 0-2 drinks a day for  men. Know how much alcohol is in your drink. In the [place] one drink equals one 12 oz bottle of beer (355 mL), one 5 oz glass of wine (148 mL), or one 1 oz glass of hard liquor (44 mL). Learn to manage stress. If you need help with this, ask your health care provider. Take care of your body  Keep your immunizations up to date. In addition to getting vaccinations as told by your health care provider, it is recommended that you get vaccinated against the following illnesses: The flu (influenza). Get a flu shot every year. Pneumonia. Hepatitis B. Schedule an eye exam soon after your diagnosis, and then one time every year after that. Check your skin and feet every day for cuts, bruises, redness, blisters, or sores. Schedule a foot exam with your health care provider once every year. Brush your teeth and gums two times a day, and floss one or more times a day. Visit your dentist one or more times every 6 months. Maintain a healthy weight. General instructions Share your diabetes management plan with people in your workplace, school, and household. Carry a medical alert card or wear medical alert jewelry. Keep all follow-up visits. This is important. Questions to ask your health care provider Should I meet with a certified diabetes care and education specialist? Where can I find a support group for people with diabetes? Where to find more information For help and guidance and for more information about diabetes, please visit: American Diabetes Association (ADA): www.diabetes.org American Association of Diabetes Care and Education Specialists (ADCES): www.diabeteseducator.org International Diabetes Federation (IDF): MemberVerification.ca Summary Caring for yourself after you have been diagnosed with type 2 diabetes (type 2 diabetes mellitus) means keeping your blood sugar (glucose) under control with a balance of nutrition, exercise, lifestyle changes, and medicine. Check your blood glucose every day, as  often as told by your health care provider. Having diabetes can put you at risk for other long-term (chronic) conditions, such as heart disease and kidney disease. Your health care provider may prescribe medicines to help prevent complications from diabetes. Share your diabetes management plan with people in your workplace, school, and household. Keep all follow-up visits. This is important. This information is not intended to replace advice given to you by your health care provider. Make sure you discuss any questions you have with your health care provider. Document Revised: 07/19/2020 Document  Reviewed: 07/19/2020 Elsevier Patient Education  [facility] for Gastroesophageal Reflux Disease, Adult When you have gastroesophageal reflux disease (GERD), the foods you eat and your eating habits are very important. Choosing the right foods can help ease the discomfort of GERD. Consider working with a dietitian to help you make healthy food choices. What are tips for following this plan? Reading food labels Look for foods that are low in saturated fat. Foods that have less than 5% of daily value (DV) of fat and 0 g of trans fats may help with your symptoms. Cooking Cook foods using methods other than frying. This may include baking, steaming, grilling, or broiling. These are all methods that do not need a lot of fat for cooking. To add flavor, try to use herbs that are low in spice and acidity. Meal planning  Choose healthy foods that are low in fat, such as fruits, vegetables, whole grains, low-fat dairy products, lean meats, fish, and poultry. Eat frequent, small meals instead of three large meals each day. Eat your meals slowly, in a relaxed setting. Avoid bending over or lying down until 2-3 hours after eating. Limit high-fat foods such as fatty meats or fried foods. Limit your intake of fatty foods, such as oils, butter, and shortening. Avoid the following as told by  your health care provider: Foods that cause symptoms. These may be different for different people. Keep a food diary to keep track of foods that cause symptoms. Alcohol. Drinking large amounts of liquid with meals. Eating meals during the 2-3 hours before bed. Lifestyle Maintain a healthy weight. Ask your health care provider what weight is healthy for you. If you need to lose weight, work with your health care provider to do so safely. Exercise for at least 30 minutes on 5 or more days each week, or as told by your health care provider. Avoid wearing clothes that fit tightly around your waist and chest. Do not use any products that contain nicotine or tobacco. These products include cigarettes, chewing tobacco, and vaping devices, such as e-cigarettes. If you need help quitting, ask your health care provider. Sleep with the head of your bed raised. Use a wedge under the mattress or blocks under the bed frame to raise the head of the bed. Chew sugar-free gum after mealtimes. What foods should I eat?  Eat a healthy, well-balanced diet of fruits, vegetables, whole grains, low-fat dairy products, lean meats, fish, and poultry. Each person is different. Foods that may trigger symptoms in one person may not trigger any symptoms in another person. Work with your health care provider to identify foods that are safe for you. The items listed above may not be a complete list of recommended foods and beverages. Contact a dietitian for more information. What foods should I avoid? Limiting some of these foods may help manage the symptoms of GERD. Everyone is different. Consult a dietitian or your health care provider to help you identify the exact foods to avoid, if any. Fruits Any fruits prepared with added fat. Any fruits that cause symptoms. For some people this may include citrus fruits, such as oranges, grapefruit, pineapple, and lemons. Vegetables Deep-fried vegetables. [place] fries. Any vegetables  prepared with added fat. Any vegetables that cause symptoms. For some people, this may include tomatoes and tomato products, chili peppers, onions and garlic, and horseradish. Grains Pastries or quick breads with added fat. Meats and other proteins High-fat meats, such as fatty beef or pork, hot  dogs, ribs, ham, sausage, salami, and bacon. Fried meat or protein, including fried fish and fried chicken. Nuts and nut butters, in large amounts. Dairy Whole milk and chocolate milk. Sour cream. Cream. Ice cream. Cream cheese. Milkshakes. Fats and oils Butter. Margarine. Shortening. Ghee. Beverages Coffee and tea, with or without caffeine. Carbonated beverages. Sodas. Energy drinks. Fruit juice made with acidic fruits, such as orange or grapefruit. Tomato juice. Alcoholic drinks. Sweets and desserts Chocolate and cocoa. Donuts. Seasonings and condiments Pepper. Peppermint and spearmint. Added salt. Any condiments, herbs, or seasonings that cause symptoms. For some people, this may include curry, hot sauce, or vinegar-based salad dressings. The items listed above may not be a complete list of foods and beverages to avoid. Contact a dietitian for more information. Questions to ask your health care provider Diet and lifestyle changes are usually the first steps that are taken to manage symptoms of GERD. If diet and lifestyle changes do not improve your symptoms, talk with your health care provider about taking medicines. Where to find more information [employer]: aboutgerd.org Summary When you have gastroesophageal reflux disease (GERD), food and lifestyle choices may be very helpful in easing the discomfort of GERD. Eat frequent, small meals instead of three large meals each day. Eat your meals slowly, in a relaxed setting. Avoid bending over or lying down until 2-3 hours after eating. Limit high-fat foods such as fatty meats or fried foods. This  information is not intended to replace advice given to you by your health care provider. Make sure you discuss any questions you have with your health care provider. Document Revised: 08/30/2019 Document Reviewed: 08/30/2019 Elsevier Patient Education  [place], [place], or [employer], Adult Bees, wasps, and hornets are part of a family of insects that can sting people. These stings can cause pain and inflammation, but they are usually not serious. However, some people may have an allergic reaction to a sting. This can cause the symptoms to be more severe. What increases the risk? You may be at a greater risk of getting stung if you: Provoke a stinging insect by swatting or disturbing it. Wear strong-smelling soaps, deodorants, or body sprays. Spend time outdoors near gardens with flowers or fruit trees or in clothes that expose skin. Eat or drink outside. What are the signs or symptoms? Common symptoms of this condition include: A red lump in the skin that sometimes has a tiny hole in the center. In some cases, a stinger may be in the center of the wound. Pain and itching at the sting site. Redness and swelling around the sting site. If you have an allergic reaction (localized allergic reaction), the swelling and redness may spread out from the sting site. In some cases, this reaction can continue to develop over the next 24-48 hours. In rare cases, a person may have a severe allergic reaction (anaphylactic reaction) to a sting. Symptoms of an anaphylactic reaction may include: Wheezing or difficulty breathing. Raised, itchy, red patches on the skin (hives). Nausea or vomiting. Abdominal cramping. Diarrhea. Tightness in the chest or chest pain. Dizziness or fainting. Redness of the face (flushing). Hoarse voice. Swollen tongue, lips, or face. How is this diagnosed? This condition is usually diagnosed based on your symptoms and medical history as well as a physical exam.  You may have an allergy test to determine if you are allergic to the substance that the insect injected during the sting (venom). How is this treated?  If you were stung by a bee, the stinger and a small sac of venom may be in the wound. It is important to remove the stinger as soon as possible. You can do this by brushing across the wound with gauze, a fingernail, or a flat card such as a credit card. Removing the stinger can help reduce the severity of your body's reaction to the sting. Most stings can be treated with: Icing to reduce swelling in the area. Medicines (antihistamines) to treat itching or an allergic reaction. Medicines to help reduce pain. These may be medicines that you take by mouth, or medicated creams or lotions that you apply to your skin. Pay close attention to your symptoms after you have been stung. If possible, have someone stay with you to make sure you do not have an allergic reaction. If you have any signs of an allergic reaction, call your health care provider. If you have ever had a severe allergic reaction, your health care provider may give you an inhaler or injectable medicine (epinephrine auto-injector) to use if necessary. Follow these instructions at home:  Wash the sting site 2-3 times each day with soap and water as told by your health care provider. Apply or take over-the-counter and prescription medicines only as told by your health care provider. If directed, apply ice to the sting area. Put ice in a plastic bag. Place a towel between your skin and the bag. Leave the ice on for 20 minutes, 2-3 times a day. Do not scratch the sting area. If you had a severe allergic reaction to a sting, you may need: To wear a medical bracelet or necklace that lists the allergy. To learn when and how to use an anaphylaxis kit or epinephrine injection. Your family members and coworkers may also need to learn this. To carry an anaphylaxis kit or epinephrine injection with you  at all times. How is this prevented? Avoid swatting at stinging insects and disturbing insect nests. Do not use fragrant soaps or lotions. Wear shoes, pants, and long sleeves when spending time outdoors, especially in grassy areas where stinging insects are common. Keep outdoor areas free from nests or hives. Keep food and drink containers covered when eating outdoors. Avoid working or sitting near [employer], if possible. Wear gloves if you are gardening or working outdoors. If an attack by a stinging insect or a swarm seems likely in the moment, move away from the area or find a barrier between you and the insect(s), such as a door. Contact a health care provider if: Your symptoms do not get better in 2-3 days. You have redness, swelling, or pain that spreads beyond the area of the sting. You have a fever. Get help right away if: You have symptoms of a severe allergic reaction. These include: Wheezing or difficulty breathing. Tightness in the chest or chest pain. Light-headedness or fainting. Itchy, raised, red patches on the skin. Nausea or vomiting. Abdominal cramping. Diarrhea. A swollen tongue or lips, or trouble swallowing. Dizziness or fainting. Summary Stings from bees, wasps, and hornets can cause pain and inflammation, but they are usually not serious. However, some people may have an allergic reaction to a sting. This can cause the symptoms to be more severe. Pay close attention to your symptoms after you have been stung. If possible, have someone stay with you to make sure you do not have an allergic reaction. Call your health care provider if you have any signs of an allergic reaction. This  information is not intended to replace advice given to you by your health care provider. Make sure you discuss any questions you have with your health care provider. Document Revised: 12/12/2019 Document Reviewed: 12/14/2019 Elsevier Patient Education  [place], Adult Ticks are insects that draw blood for food. Most ticks live in shrubs and grassy and wooded areas. They climb onto people and animals that brush against the leaves and grasses that they rest on. Then they bite, attaching themselves to the skin. Most ticks are harmless, but some ticks may carry germs that can spread to a person through a bite and cause a disease. To reduce your risk of getting a disease from a tick bite, make sure you: Take steps to prevent tick bites. Check for ticks after being outdoors where ticks live. Watch for symptoms of disease if a tick attached to you or if you suspect a tick bite. How can I prevent tick bites? Take these steps to help prevent tick bites when you go outdoors in an area where ticks live: Use insect repellent Use insect repellent that has DEET (20% or higher), picaridin, or IR3535 in it. Follow the instructions on the label. Use these products on: Bare skin. The top of your boots. Your pant legs. Your sleeve cuffs. For insect repellent that contains permethrin, follow the instructions on the label. Use these products on: Clothing. Boots. Outdoor gear. Tents. When you are outside Wear protective clothing. Long sleeves and long pants offer the best protection from ticks. Wear light-colored clothing so you can see ticks more easily. Tuck your pant legs into your socks. If you go walking on a trail, stay in the middle of the trail so your skin, hair, and clothing do not touch the bushes. Avoid walking through areas with long grass. Check for ticks on your clothing, hair, and skin often while you are outside, and check again before you go inside. Make sure to check the scalp, neck, armpits, waist, groin, and joint areas. These are the spots where ticks attach themselves most often. When you go indoors Check your clothing for ticks. Tumble dry clothes in a dryer on high heat for at least 10 minutes. If clothes are  damp, additional time may be needed. If clothes require washing, use hot water. Examine gear and pets. Shower soon after being outdoors. Check your body for ticks. Conduct a full body check using a mirror. What is the proper way to remove a tick? If you find a tick on your body, remove it as soon as possible. Removing a tick sooner can prevent germs from passing to your body. Do not remove the tick with your bare fingers. To remove a tick that is crawling on your skin but has not bitten, use either of these methods: Go outdoors and brush the tick off. Remove the tick with tape or a lint roller. To remove a tick that is attached to your skin: Wash your hands. If you have latex gloves, put them on. Use fine-tipped tweezers, curved forceps, or a tick-removal tool to gently grasp the tick as close to your skin and the tick's head as possible. Gently pull with a steady, upward, even pressure until the tick lets go. When removing the tick: Take care to keep the tick's head attached to its body. Do not twist or jerk the tick. This can make the tick's head or mouth parts break off and remain in the skin. Do not squeeze  or crush the tick's body. This could force disease-carrying fluids from the tick into your body. Do not try to remove a tick with heat, alcohol, petroleum jelly, or fingernail polish. Using these methods can cause the tick to salivate and regurgitate into your bloodstream, increasing your risk of getting a disease. What should I do after removing a tick? Dispose of the tick. Do not crush a tick with your fingers. Clean the bite area and your hands with soap and water, rubbing alcohol, or an iodine scrub. If an antiseptic cream or ointment is available, apply a small amount to the bite site. Wash and disinfect any instruments that you used to remove the tick. How should I dispose of a tick? To dispose of a live tick, use one of these methods: Place it in rubbing alcohol. Place it in a  sealed bag or container. Wrap it tightly in tape. Flush it down the toilet. Contact a health care provider if: You have symptoms of a disease after a tick bite. Symptoms of a tick-borne disease can occur from moments after the tick bites to 30 days after a tick is removed. Symptoms include: Fever or chills. Any of these signs in the bite area: A red rash that makes a circle (bull's-eye rash) in the bite area. Redness and swelling. Headache. Muscle, joint, or bone pain. Abnormal tiredness. Numbness in your legs or difficulty walking or moving your legs. Tender, swollen lymph glands. A part of a tick breaks off and gets stuck in your skin. Get help right away if: You are not able to remove a tick. You experience muscle weakness or paralysis. Your symptoms get worse or you experience new symptoms. You find an engorged tick on your skin and you are in an area where disease from ticks is a high risk. Summary Ticks may carry germs that can spread to a person through a bite and cause a disease. Wear protective clothing and use insect repellent to prevent tick bites. Follow the instructions on the label. If you find a tick on your body, remove it as soon as possible. If the tick is attached, do not try to remove with heat, alcohol, petroleum jelly, or fingernail polish. Remove the attached tick using fine-tipped tweezers, curved forceps, or a tick-removal tool. Gently pull with steady, upward, even pressure until the tick lets go. Do not twist or jerk the tick. Do not squeeze or crush the tick's body. If you have symptoms of a disease after being bitten by a tick, contact a health care provider. This information is not intended to replace advice given to you by your health care provider. Make sure you discuss any questions you have with your health care provider. Document Revised: 02/15/2019 Document Reviewed: 02/15/2019 Elsevier Patient Education  [place],   Merri Ray, MD [place] 08/29/21 9:03 AM

## 2021-08-29 NOTE — Patient Instructions (Addendum)
A1c looks good. No change in meds today.   If any worsening heartburn return to discuss symptoms further. Try to avoid trigger foods listed below.  Follow up in 6 months - fasting labs in 6 months.  See info on tick bites, check self for ticks after working outside daily.  Return to the clinic or go to the nearest emergency room if any of your symptoms worsen or new symptoms occur. Take care!  Type 2 Diabetes Mellitus, Self-Care, Adult Caring for yourself after you have been diagnosed with type 2 diabetes (type 2 diabetes mellitus) means keeping your blood sugar (glucose) under control with a balance of: Nutrition. Exercise. Lifestyle changes. Medicines or insulin, if needed. Support from your team of health care providers and others. What are the risks? Having type 2 diabetes can put you at risk for other long-term (chronic) conditions, such as heart disease and kidney disease. Your health care provider may prescribe medicines to help prevent complications from diabetes. How to monitor your blood glucose  Check your blood glucose every day or as often as told by your health care provider. Have your A1C (hemoglobin A1C) level checked two or more times a year, or as often as told by your health care provider. Your health care provider will set personalized treatment goals for you. Generally, the goal of treatment is to maintain the following blood glucose levels: Before meals: 80-130 mg/dL (4.4-7.2 mmol/L). After meals: below 180 mg/dL (10 mmol/L). A1C level: less than 7%. How to manage hyperglycemia and hypoglycemia Hyperglycemia symptoms Hyperglycemia, also called high blood glucose, occurs when blood glucose is too high. Make sure you know the early signs of hyperglycemia, such as: Increased thirst. Hunger. Feeling very tired. Needing to urinate more often than usual. Blurry vision. Hypoglycemia symptoms Hypoglycemia, also called low blood glucose, occurs with a blood glucose  level at or below 70 mg/dL (3.9 mmol/L). Diabetes medicines lower your blood glucose and can cause hypoglycemia. The risk for hypoglycemia increases during or after exercise, during sleep, during illness, and when skipping meals or not eating for a long time (fasting). It is important to know the symptoms of hypoglycemia and treat it right away. Always have a 15-gram rapid-acting carbohydrate snack with you to treat low blood glucose. Family members and close friends should also know the symptoms and understand how to treat hypoglycemia, in case you are not able to treat yourself. Symptoms may include: Hunger. Anxiety. Sweating and feeling clammy. Dizziness or feeling light-headed. Sleepiness. Increased heart rate. Irritability. Tingling or numbness around the mouth, lips, or tongue. Restless sleep. Severe hypoglycemia is when your blood glucose level is at or below 54 mg/dL (3 mmol/L). Severe hypoglycemia is an emergency. Do not wait to see if the symptoms will go away. Get medical help right away. Call your local emergency services (911 in the [place] Do not drive yourself to the hospital. If you have severe hypoglycemia and you cannot eat or drink, you may need glucagon. A family member or close friend should learn how to check your blood glucose and how to give you glucagon. Ask your health care provider if you need to have an emergency glucagon kit available. Follow these instructions at home: Medicines Take prescribed insulin or diabetes medicines as told by your health care provider. Do not run out of insulin or other diabetes medicines. Plan ahead so you always have these available. If you use insulin, adjust your dosage based on your physical activity and what foods you eat. Your  health care provider will tell you how to adjust your dosage. Take over-the-counter and prescription medicines only as told by your health care provider. Eating and drinking  What you eat and drink affects your  blood glucose and your insulin dosage. Making good choices helps to control your diabetes and prevent other health problems. A healthy meal plan includes eating lean proteins, complex carbohydrates, fresh fruits and vegetables, low-fat dairy products, and healthy fats. Make an appointment to see a registered dietitian to help you create an eating plan that is right for you. Make sure that you: Follow instructions from your health care provider about eating or drinking restrictions. Drink enough fluid to keep your urine pale yellow. Keep a record of the carbohydrates that you eat. Do this by reading food labels and learning the standard serving sizes of foods. Follow your sick-day plan whenever you cannot eat or drink as usual. Make this plan in advance with your health care provider.  Activity Stay active. Exercise regularly, as told by your health care provider. This may include: Stretching and doing strength exercises, such as yoga or weight lifting, two or more times a week. Doing 150 minutes or more of moderate-intensity or vigorous-intensity exercise each week. This could be brisk walking, biking, or water aerobics. Spread out your activity over 3 or more days of the week. Do not go more than 2 days in a row without doing some kind of physical activity. When you start a new exercise or activity, work with your health care provider to adjust your insulin, medicines, or food intake as needed. Lifestyle Do not use any products that contain nicotine or tobacco. These products include cigarettes, chewing tobacco, and vaping devices, such as e-cigarettes. If you need help quitting, ask your health care provider. If you drink alcohol and your health care provider says that it is safe for you: Limit how much you have to: 0-1 drink a day for women who are not pregnant. 0-2 drinks a day for men. Know how much alcohol is in your drink. In the [place] one drink equals one 12 oz bottle of beer (355 mL),  one 5 oz glass of wine (148 mL), or one 1 oz glass of hard liquor (44 mL). Learn to manage stress. If you need help with this, ask your health care provider. Take care of your body  Keep your immunizations up to date. In addition to getting vaccinations as told by your health care provider, it is recommended that you get vaccinated against the following illnesses: The flu (influenza). Get a flu shot every year. Pneumonia. Hepatitis B. Schedule an eye exam soon after your diagnosis, and then one time every year after that. Check your skin and feet every day for cuts, bruises, redness, blisters, or sores. Schedule a foot exam with your health care provider once every year. Brush your teeth and gums two times a day, and floss one or more times a day. Visit your dentist one or more times every 6 months. Maintain a healthy weight. General instructions Share your diabetes management plan with people in your workplace, school, and household. Carry a medical alert card or wear medical alert jewelry. Keep all follow-up visits. This is important. Questions to ask your health care provider Should I meet with a certified diabetes care and education specialist? Where can I find a support group for people with diabetes? Where to find more information For help and guidance and for more information about diabetes, please visit: American Diabetes Association (  ADA): www.diabetes.org American Association of Diabetes Care and Education Specialists (ADCES): www.diabeteseducator.org International Diabetes Federation (IDF): MemberVerification.ca Summary Caring for yourself after you have been diagnosed with type 2 diabetes (type 2 diabetes mellitus) means keeping your blood sugar (glucose) under control with a balance of nutrition, exercise, lifestyle changes, and medicine. Check your blood glucose every day, as often as told by your health care provider. Having diabetes can put you at risk for other long-term (chronic)  conditions, such as heart disease and kidney disease. Your health care provider may prescribe medicines to help prevent complications from diabetes. Share your diabetes management plan with people in your workplace, school, and household. Keep all follow-up visits. This is important. This information is not intended to replace advice given to you by your health care provider. Make sure you discuss any questions you have with your health care provider. Document Revised: 07/19/2020 Document Reviewed: 07/19/2020 Elsevier Patient Education  [place] for Gastroesophageal Reflux Disease, Adult When you have gastroesophageal reflux disease (GERD), the foods you eat and your eating habits are very important. Choosing the right foods can help ease the discomfort of GERD. Consider working with a dietitian to help you make healthy food choices. What are tips for following this plan? Reading food labels Look for foods that are low in saturated fat. Foods that have less than 5% of daily value (DV) of fat and 0 g of trans fats may help with your symptoms. Cooking Cook foods using methods other than frying. This may include baking, steaming, grilling, or broiling. These are all methods that do not need a lot of fat for cooking. To add flavor, try to use herbs that are low in spice and acidity. Meal planning  Choose healthy foods that are low in fat, such as fruits, vegetables, whole grains, low-fat dairy products, lean meats, fish, and poultry. Eat frequent, small meals instead of three large meals each day. Eat your meals slowly, in a relaxed setting. Avoid bending over or lying down until 2-3 hours after eating. Limit high-fat foods such as fatty meats or fried foods. Limit your intake of fatty foods, such as oils, butter, and shortening. Avoid the following as told by your health care provider: Foods that cause symptoms. These may be different for different people. Keep a food  diary to keep track of foods that cause symptoms. Alcohol. Drinking large amounts of liquid with meals. Eating meals during the 2-3 hours before bed. Lifestyle Maintain a healthy weight. Ask your health care provider what weight is healthy for you. If you need to lose weight, work with your health care provider to do so safely. Exercise for at least 30 minutes on 5 or more days each week, or as told by your health care provider. Avoid wearing clothes that fit tightly around your waist and chest. Do not use any products that contain nicotine or tobacco. These products include cigarettes, chewing tobacco, and vaping devices, such as e-cigarettes. If you need help quitting, ask your health care provider. Sleep with the head of your bed raised. Use a wedge under the mattress or blocks under the bed frame to raise the head of the bed. Chew sugar-free gum after mealtimes. What foods should I eat?  Eat a healthy, well-balanced diet of fruits, vegetables, whole grains, low-fat dairy products, lean meats, fish, and poultry. Each person is different. Foods that may trigger symptoms in one person may not trigger any symptoms in another person. Work with your  health care provider to identify foods that are safe for you. The items listed above may not be a complete list of recommended foods and beverages. Contact a dietitian for more information. What foods should I avoid? Limiting some of these foods may help manage the symptoms of GERD. Everyone is different. Consult a dietitian or your health care provider to help you identify the exact foods to avoid, if any. Fruits Any fruits prepared with added fat. Any fruits that cause symptoms. For some people this may include citrus fruits, such as oranges, grapefruit, pineapple, and lemons. Vegetables Deep-fried vegetables. [place] fries. Any vegetables prepared with added fat. Any vegetables that cause symptoms. For some people, this may include tomatoes and tomato  products, chili peppers, onions and garlic, and horseradish. Grains Pastries or quick breads with added fat. Meats and other proteins High-fat meats, such as fatty beef or pork, hot dogs, ribs, ham, sausage, salami, and bacon. Fried meat or protein, including fried fish and fried chicken. Nuts and nut butters, in large amounts. Dairy Whole milk and chocolate milk. Sour cream. Cream. Ice cream. Cream cheese. Milkshakes. Fats and oils Butter. Margarine. Shortening. Ghee. Beverages Coffee and tea, with or without caffeine. Carbonated beverages. Sodas. Energy drinks. Fruit juice made with acidic fruits, such as orange or grapefruit. Tomato juice. Alcoholic drinks. Sweets and desserts Chocolate and cocoa. Donuts. Seasonings and condiments Pepper. Peppermint and spearmint. Added salt. Any condiments, herbs, or seasonings that cause symptoms. For some people, this may include curry, hot sauce, or vinegar-based salad dressings. The items listed above may not be a complete list of foods and beverages to avoid. Contact a dietitian for more information. Questions to ask your health care provider Diet and lifestyle changes are usually the first steps that are taken to manage symptoms of GERD. If diet and lifestyle changes do not improve your symptoms, talk with your health care provider about taking medicines. Where to find more information International Foundation for Gastrointestinal Disorders: aboutgerd.org Summary When you have gastroesophageal reflux disease (GERD), food and lifestyle choices may be very helpful in easing the discomfort of GERD. Eat frequent, small meals instead of three large meals each day. Eat your meals slowly, in a relaxed setting. Avoid bending over or lying down until 2-3 hours after eating. Limit high-fat foods such as fatty meats or fried foods. This information is not intended to replace advice given to you by your health care provider. Make sure you discuss any questions  you have with your health care provider. Document Revised: 08/30/2019 Document Reviewed: 08/30/2019 Elsevier Patient Education  [place], [place], or [employer], Adult Bees, wasps, and hornets are part of a family of insects that can sting people. These stings can cause pain and inflammation, but they are usually not serious. However, some people may have an allergic reaction to a sting. This can cause the symptoms to be more severe. What increases the risk? You may be at a greater risk of getting stung if you: Provoke a stinging insect by swatting or disturbing it. Wear strong-smelling soaps, deodorants, or body sprays. Spend time outdoors near gardens with flowers or fruit trees or in clothes that expose skin. Eat or drink outside. What are the signs or symptoms? Common symptoms of this condition include: A red lump in the skin that sometimes has a tiny hole in the center. In some cases, a stinger may be in the center of the wound. Pain and itching at the sting site. Redness  and swelling around the sting site. If you have an allergic reaction (localized allergic reaction), the swelling and redness may spread out from the sting site. In some cases, this reaction can continue to develop over the next 24-48 hours. In rare cases, a person may have a severe allergic reaction (anaphylactic reaction) to a sting. Symptoms of an anaphylactic reaction may include: Wheezing or difficulty breathing. Raised, itchy, red patches on the skin (hives). Nausea or vomiting. Abdominal cramping. Diarrhea. Tightness in the chest or chest pain. Dizziness or fainting. Redness of the face (flushing). Hoarse voice. Swollen tongue, lips, or face. How is this diagnosed? This condition is usually diagnosed based on your symptoms and medical history as well as a physical exam. You may have an allergy test to determine if you are allergic to the substance that the insect injected during the sting  (venom). How is this treated? If you were stung by a bee, the stinger and a small sac of venom may be in the wound. It is important to remove the stinger as soon as possible. You can do this by brushing across the wound with gauze, a fingernail, or a flat card such as a credit card. Removing the stinger can help reduce the severity of your body's reaction to the sting. Most stings can be treated with: Icing to reduce swelling in the area. Medicines (antihistamines) to treat itching or an allergic reaction. Medicines to help reduce pain. These may be medicines that you take by mouth, or medicated creams or lotions that you apply to your skin. Pay close attention to your symptoms after you have been stung. If possible, have someone stay with you to make sure you do not have an allergic reaction. If you have any signs of an allergic reaction, call your health care provider. If you have ever had a severe allergic reaction, your health care provider may give you an inhaler or injectable medicine (epinephrine auto-injector) to use if necessary. Follow these instructions at home:  Wash the sting site 2-3 times each day with soap and water as told by your health care provider. Apply or take over-the-counter and prescription medicines only as told by your health care provider. If directed, apply ice to the sting area. Put ice in a plastic bag. Place a towel between your skin and the bag. Leave the ice on for 20 minutes, 2-3 times a day. Do not scratch the sting area. If you had a severe allergic reaction to a sting, you may need: To wear a medical bracelet or necklace that lists the allergy. To learn when and how to use an anaphylaxis kit or epinephrine injection. Your family members and coworkers may also need to learn this. To carry an anaphylaxis kit or epinephrine injection with you at all times. How is this prevented? Avoid swatting at stinging insects and disturbing insect nests. Do not use  fragrant soaps or lotions. Wear shoes, pants, and long sleeves when spending time outdoors, especially in grassy areas where stinging insects are common. Keep outdoor areas free from nests or hives. Keep food and drink containers covered when eating outdoors. Avoid working or sitting near [employer], if possible. Wear gloves if you are gardening or working outdoors. If an attack by a stinging insect or a swarm seems likely in the moment, move away from the area or find a barrier between you and the insect(s), such as a door. Contact a health care provider if: Your symptoms do not get better in 2-3  days. You have redness, swelling, or pain that spreads beyond the area of the sting. You have a fever. Get help right away if: You have symptoms of a severe allergic reaction. These include: Wheezing or difficulty breathing. Tightness in the chest or chest pain. Light-headedness or fainting. Itchy, raised, red patches on the skin. Nausea or vomiting. Abdominal cramping. Diarrhea. A swollen tongue or lips, or trouble swallowing. Dizziness or fainting. Summary Stings from bees, wasps, and hornets can cause pain and inflammation, but they are usually not serious. However, some people may have an allergic reaction to a sting. This can cause the symptoms to be more severe. Pay close attention to your symptoms after you have been stung. If possible, have someone stay with you to make sure you do not have an allergic reaction. Call your health care provider if you have any signs of an allergic reaction. This information is not intended to replace advice given to you by your health care provider. Make sure you discuss any questions you have with your health care provider. Document Revised: 12/12/2019 Document Reviewed: 12/14/2019 Elsevier Patient Education  [place], Adult Ticks are insects that draw blood for food. Most ticks live in shrubs and grassy and  wooded areas. They climb onto people and animals that brush against the leaves and grasses that they rest on. Then they bite, attaching themselves to the skin. Most ticks are harmless, but some ticks may carry germs that can spread to a person through a bite and cause a disease. To reduce your risk of getting a disease from a tick bite, make sure you: Take steps to prevent tick bites. Check for ticks after being outdoors where ticks live. Watch for symptoms of disease if a tick attached to you or if you suspect a tick bite. How can I prevent tick bites? Take these steps to help prevent tick bites when you go outdoors in an area where ticks live: Use insect repellent Use insect repellent that has DEET (20% or higher), picaridin, or IR3535 in it. Follow the instructions on the label. Use these products on: Bare skin. The top of your boots. Your pant legs. Your sleeve cuffs. For insect repellent that contains permethrin, follow the instructions on the label. Use these products on: Clothing. Boots. Outdoor gear. Tents. When you are outside Wear protective clothing. Long sleeves and long pants offer the best protection from ticks. Wear light-colored clothing so you can see ticks more easily. Tuck your pant legs into your socks. If you go walking on a trail, stay in the middle of the trail so your skin, hair, and clothing do not touch the bushes. Avoid walking through areas with long grass. Check for ticks on your clothing, hair, and skin often while you are outside, and check again before you go inside. Make sure to check the scalp, neck, armpits, waist, groin, and joint areas. These are the spots where ticks attach themselves most often. When you go indoors Check your clothing for ticks. Tumble dry clothes in a dryer on high heat for at least 10 minutes. If clothes are damp, additional time may be needed. If clothes require washing, use hot water. Examine gear and pets. Shower soon after being  outdoors. Check your body for ticks. Conduct a full body check using a mirror. What is the proper way to remove a tick? If you find a tick on your body, remove it as soon as possible. Removing a tick  sooner can prevent germs from passing to your body. Do not remove the tick with your bare fingers. To remove a tick that is crawling on your skin but has not bitten, use either of these methods: Go outdoors and brush the tick off. Remove the tick with tape or a lint roller. To remove a tick that is attached to your skin: Wash your hands. If you have latex gloves, put them on. Use fine-tipped tweezers, curved forceps, or a tick-removal tool to gently grasp the tick as close to your skin and the tick's head as possible. Gently pull with a steady, upward, even pressure until the tick lets go. When removing the tick: Take care to keep the tick's head attached to its body. Do not twist or jerk the tick. This can make the tick's head or mouth parts break off and remain in the skin. Do not squeeze or crush the tick's body. This could force disease-carrying fluids from the tick into your body. Do not try to remove a tick with heat, alcohol, petroleum jelly, or fingernail polish. Using these methods can cause the tick to salivate and regurgitate into your bloodstream, increasing your risk of getting a disease. What should I do after removing a tick? Dispose of the tick. Do not crush a tick with your fingers. Clean the bite area and your hands with soap and water, rubbing alcohol, or an iodine scrub. If an antiseptic cream or ointment is available, apply a small amount to the bite site. Wash and disinfect any instruments that you used to remove the tick. How should I dispose of a tick? To dispose of a live tick, use one of these methods: Place it in rubbing alcohol. Place it in a sealed bag or container. Wrap it tightly in tape. Flush it down the toilet. Contact a health care provider if: You have  symptoms of a disease after a tick bite. Symptoms of a tick-borne disease can occur from moments after the tick bites to 30 days after a tick is removed. Symptoms include: Fever or chills. Any of these signs in the bite area: A red rash that makes a circle (bull's-eye rash) in the bite area. Redness and swelling. Headache. Muscle, joint, or bone pain. Abnormal tiredness. Numbness in your legs or difficulty walking or moving your legs. Tender, swollen lymph glands. A part of a tick breaks off and gets stuck in your skin. Get help right away if: You are not able to remove a tick. You experience muscle weakness or paralysis. Your symptoms get worse or you experience new symptoms. You find an engorged tick on your skin and you are in an area where disease from ticks is a high risk. Summary Ticks may carry germs that can spread to a person through a bite and cause a disease. Wear protective clothing and use insect repellent to prevent tick bites. Follow the instructions on the label. If you find a tick on your body, remove it as soon as possible. If the tick is attached, do not try to remove with heat, alcohol, petroleum jelly, or fingernail polish. Remove the attached tick using fine-tipped tweezers, curved forceps, or a tick-removal tool. Gently pull with steady, upward, even pressure until the tick lets go. Do not twist or jerk the tick. Do not squeeze or crush the tick's body. If you have symptoms of a disease after being bitten by a tick, contact a health care provider. This information is not intended to replace advice given to you  by your health care provider. Make sure you discuss any questions you have with your health care provider. Document Revised: 02/15/2019 Document Reviewed: 02/15/2019 Elsevier Patient Education  [place].

## 2022-01-04 ENCOUNTER — Encounter: Payer: Self-pay | Admitting: Family Medicine

## 2022-01-21 ENCOUNTER — Encounter: Payer: Self-pay | Admitting: Family Medicine

## 2022-01-21 ENCOUNTER — Ambulatory Visit: Payer: 59 | Admitting: Family Medicine

## 2022-01-21 VITALS — BP 134/82 | HR 77 | Temp 98.2°F | Ht 71.0 in | Wt 218.6 lb

## 2022-01-21 DIAGNOSIS — K219 Gastro-esophageal reflux disease without esophagitis: Secondary | ICD-10-CM | POA: Diagnosis not present

## 2022-01-21 DIAGNOSIS — M25531 Pain in right wrist: Secondary | ICD-10-CM

## 2022-01-21 DIAGNOSIS — E782 Mixed hyperlipidemia: Secondary | ICD-10-CM

## 2022-01-21 DIAGNOSIS — N182 Chronic kidney disease, stage 2 (mild): Secondary | ICD-10-CM | POA: Diagnosis not present

## 2022-01-21 DIAGNOSIS — E1122 Type 2 diabetes mellitus with diabetic chronic kidney disease: Secondary | ICD-10-CM | POA: Diagnosis not present

## 2022-01-21 DIAGNOSIS — I10 Essential (primary) hypertension: Secondary | ICD-10-CM

## 2022-01-21 DIAGNOSIS — M654 Radial styloid tenosynovitis [de Quervain]: Secondary | ICD-10-CM

## 2022-01-21 LAB — LIPID PANEL
Cholesterol: 144 mg/dL (ref 0–200)
HDL: 34.2 mg/dL — ABNORMAL LOW (ref >39.00)
LDL Cholesterol: 84 mg/dL (ref 0–99)
NonHDL: 109.97
Total CHOL/HDL Ratio: 4
Triglycerides: 129 mg/dL (ref 0.0–149.0)
VLDL: 25.8 mg/dL (ref 0.0–40.0)

## 2022-01-21 LAB — COMPREHENSIVE METABOLIC PANEL
ALT: 32 U/L (ref 0–53)
AST: 24 U/L (ref 0–37)
Albumin: 4.4 g/dL (ref 3.5–5.2)
Alkaline Phosphatase: 64 U/L (ref 39–117)
BUN: 16 mg/dL (ref 6–23)
CO2: 28 mEq/L (ref 19–32)
Calcium: 9.4 mg/dL (ref 8.4–10.5)
Chloride: 103 mEq/L (ref 96–112)
Creatinine, Ser: 1.05 mg/dL (ref 0.40–1.50)
GFR: 77.43 mL/min (ref >60.00)
Glucose, Bld: 136 mg/dL — ABNORMAL HIGH (ref 70–99)
Potassium: 4.5 mEq/L (ref 3.5–5.1)
Sodium: 140 mEq/L (ref 135–145)
Total Bilirubin: 0.6 mg/dL (ref 0.2–1.2)
Total Protein: 6.8 g/dL (ref 6.0–8.3)

## 2022-01-21 LAB — HEMOGLOBIN A1C: Hgb A1c MFr Bld: 6.7 % — ABNORMAL HIGH (ref 4.6–6.5)

## 2022-01-21 MED ORDER — LOSARTAN POTASSIUM 100 MG PO TABS: 100.0000 mg | ORAL_TABLET | Freq: Every day | ORAL | 2 refills | Status: DC

## 2022-01-21 MED ORDER — CARVEDILOL 6.25 MG PO TABS: 6.2500 mg | ORAL_TABLET | Freq: Two times a day (BID) | ORAL | 2 refills | Status: DC

## 2022-01-21 MED ORDER — SIMVASTATIN 20 MG PO TABS: 20.0000 mg | ORAL_TABLET | Freq: Every day | ORAL | 2 refills | Status: DC

## 2022-01-21 MED ORDER — LINAGLIPTIN 5 MG PO TABS: 5.0000 mg | ORAL_TABLET | Freq: Every day | ORAL | 2 refills | Status: DC

## 2022-01-21 NOTE — Progress Notes (Unsigned)
Subjective:  Patient ID: Shawn Ban., male    DOB: [birth year]  Age: 61 y.o. MRN: [id number]  CC:  Chief Complaint  Patient presents with   Diabetes    Pt states all is well   Wrist Pain    Pt wants a referral for his wrist pain been using topical pain relief and tylenol     HPI Shawn Ban. presents for   Diabetes: With hyperglycemia, microalbuminuria, CKD.  Has treated with herbal approach, including cinnamon, grapefruit seed extract, grape seed, and diet approach.  Previously took bitter melon.  He is on statin and ACE inhibitor. See recent notes. Blood sugars creeping up. Stopped supplements, started Tradjenta 5mg  QD past few weeks. Doing well. No side effects.  Fasting: 110-123 Postprandial - evening - 143, 120's Microalbumin: Ratio 37.7, microalbumin 52.5 on 05/04/2021. Optho, foot exam, pneumovax:  Flu vaccine at [facility]   Lab Results  Component Value Date   HGBA1C 6.4 (A) 08/29/2021   HGBA1C 6.7 (H) 05/04/2021   HGBA1C 6.6 (H) 12/22/2020   Lab Results  Component Value Date   MICROALBUR 52.5 (H) 05/04/2021   LDLCALC 69 07/06/2021   CREATININE 1.01 06/25/2021   Hypertension: Carvedilol 6.25 mg twice daily - has rarely taken extra dose with diastolic in 90 range.  losartan 100 mg daily, history of microalbuminuria as above.  Also takes collagen supplement Home readings:120-125/80 BP Readings from Last 3 Encounters:  01/21/22 134/82  08/29/21 132/78  06/25/21 122/78   Lab Results  Component Value Date   CREATININE 1.01 06/25/2021   GERD stable with omeprazole 1-2 per day with probiotic.   Hyperlipidemia: Simvastatin 20 mg daily, Lovaza 2 capsules twice daily. Green lipid muscle supplement. No new myalgia/side effects.  Lab Results  Component Value Date   CHOL 113 07/06/2021   HDL 27.30 (L) 07/06/2021   LDLCALC 69 07/06/2021   LDLDIRECT 75.0 06/25/2021   TRIG 84.0 07/06/2021   CHOLHDL 4 07/06/2021   Lab Results  Component Value Date    ALT 37 06/25/2021   AST 22 06/25/2021   ALKPHOS 67 06/25/2021   BILITOT 0.5 06/25/2021   Wrist pain New concern. Felt twinge of pain in R wrist about 5 months ago pulling up groceries out of car. Some swelling. No color change. Sore at base of thumb.   Tx: topical antiinflammatory cream, copper wrist band, tylenol. Some relief.  Sore to move, still swollen at times. Better but still sore. Wants to wait until January to see specialist.  Has seen Dr. February in past for carpal tunnel, trigger finger on R hand.     History Patient Active Problem List   Diagnosis Date Noted   Acute viral conjunctivitis of right eye 08/28/2020   Routine general medical examination at a health care facility 05/01/2020   Hyperlipidemia LDL goal <70 03/15/2020   Hypertriglyceridemia 03/15/2020   Diabetes mellitus without complication (HCC)    Benign essential HTN 08/25/2019   Past Medical History:  Diagnosis Date   Allergy    Arthritis    Chronic bronchitis    Chronic pain    per pt/ right shoulder pain, left leg and ankle   CKD (chronic kidney disease) stage 3, GFR 30-59 ml/min (HCC)    Costochondral chest pain    due to torn tendons.   CTS (carpal tunnel syndrome)    right hand   Diabetes mellitus without complication (HCC)    prediabetes/ no meds   Diverticulitis  GERD (gastroesophageal reflux disease)    History of anal fissures    Hyperlipemia    Hypertension    Rectal bleeding    occasional   Seasonal allergies    Past Surgical History:  Procedure Laterality Date   ANKLE FRACTURE SURGERY  2005 / 2007   left ankle and  left leg/ have screws in leg and ankle   CARPAL TUNNEL RELEASE Left 07/29/2019   Procedure: CARPAL TUNNEL RELEASE;  Surgeon: Cindee Salt, MD;  Location: [facility];  Service: Orthopedics;  Laterality: Left;  FOREARM BLOCK   EXAMINATION UNDER ANESTHESIA N/A 12/15/2012   Procedure: EXAM UNDER ANESTHESIA;  Surgeon: Atilano Ina, MD;  Location: [facility];  Service: General;  Laterality: N/A;   FRACTURE SURGERY  2005 and 2007   lt ankle x2   HEMORRHOIDECTOMY WITH HEMORRHOID BANDING N/A 12/15/2012   Procedure: EXCISIONAL HEMORRHOIDECTOMY WITH HEMORRHOID BANDING;  Surgeon: Atilano Ina, MD;  Location: [facility];  Service: General;  Laterality: N/A;   rotator cuff surgery      x 2/ right shoulder   TRIGGER FINGER RELEASE  07/23/2011   Procedure: Right hand- RELEASE TRIGGER FINGER/A-1 PULLEY;  Surgeon: Wyn Forster., MD;  Location: [facility];  Service: Orthopedics;  Laterality: Right;   WRIST FRACTURE SURGERY  1984   lt with bone graft   WRIST SURGERY     left wrist   Allergies  Allergen Reactions   Amlodipine Other (See Comments)    Per pt - damage to kidney's   Codeine Nausea And Vomiting   Ivp Dye [Iodinated Contrast Media]     Nauseated   Penicillins Nausea And Vomiting    As a child   Sulfa Antibiotics     nauseated   Prior to Admission medications   Medication Sig Start Date End Date Taking? Authorizing Provider  ASPIRIN 81 PO Take 1 tablet by mouth daily.   Yes [provider]  Biotin 5000 MCG CAPS 2 times a week 12/03/14  Yes [provider]  carvedilol (COREG) 6.25 MG tablet Take 1 tablet (6.25 mg total) by mouth 2 (two) times daily with a meal. 08/29/21  Yes Shade Flood, MD  cetirizine (ZYRTEC) 10 MG tablet Take 1 tablet (10 mg total) by mouth daily. 10/28/19  Yes Donita Brooks, MD  Collagen Hydrolysate, Bovine, POWD by Does not apply route. Takes Tour manager" for arthritis   Yes [provider]  FIBER COMPLETE PO Take by mouth. Vita Fusion fiber gummies   Yes [provider]  fluticasone (FLONASE) 50 MCG/ACT nasal spray Place 2 sprays into both nostrils daily. 04/27/18  Yes Donita Brooks, MD  Garlic Oil 1000 MG CAPS  10/02/20  Yes [provider]  Glucosamine-Chondroitin-MSM 500-200-150 MG TABS Take by mouth.   Yes [provider]  glucose blood (ONETOUCH VERIO) test strip CHECK BLOOD SUGARS IN THE MORNING AND AT BEDTIME. 07/19/21  Yes Shade Flood, MD  Lancets (FREESTYLE) lancets Use as instructed 10/28/19  Yes Donita Brooks, MD  losartan (COZAAR) 100 MG tablet Take 1 tablet (100 mg total) by mouth daily. 08/29/21  Yes Shade Flood, MD  MAGNESIUM OXIDE PO Take 375 mg by mouth daily.   Yes [provider]  mometasone (ELOCON) 0.1 % ointment Apply topically daily.   Yes [provider]  mometasone-formoterol (DULERA) 100-5 MCG/ACT AERO Inhale 2 puffs into the lungs 2 (two) times daily.  Yes [provider]  Multiple Vitamins-Minerals (VISION FORMULA EYE HEALTH PO)  06/02/17  Yes [provider]  omega-3 acid ethyl esters (LOVAZA) 1 g capsule Take 2 capsules (2 g total) by mouth 2 (two) times daily. 08/29/21  Yes Shade FloodGreene, Nocholas Damaso R, MD  omeprazole (PRILOSEC) 20 MG capsule Take 1 capsule (20 mg total) by mouth 2 (two) times daily before a meal. 08/29/21  Yes Shade FloodGreene, Jaivyn Gulla R, MD  simvastatin (ZOCOR) 20 MG tablet Take 1 tablet (20 mg total) by mouth at bedtime. 08/29/21  Yes Shade FloodGreene, Carlito Bogert R, MD  Barberry-Oreg Grape-Goldenseal (BERBERINE COMPLEX PO) Take 1,200 mg by mouth. 1200mg  of HCI and 200mg  of organic cinnamon bark Patient not taking: Reported on 01/21/2022    [provider]  CINNAMON PO 2,000 mg. Patient not taking: Reported on 01/21/2022 10/02/20   [provider]  L-Lysine 1000 MG TABS Take by mouth. Patient not taking: Reported on 01/21/2022    [provider]  L-Lysine 1000 MG TABS  03/04/12   [provider]  NON FORMULARY Take 500 mg by mouth daily. Mangosteen Patient not taking: Reported on 01/21/2022    [provider]  NON FORMULARY Ceylon Cinnamon multiple tablets of this kind as well Patient not taking: Reported on 01/21/2022    [provider]  NON FORMULARY Grapefruit seed extract Patient not  taking: Reported on 01/21/2022    [provider]  Nutritional Supplements (GRAPESEED EXTRACT) 500-50 MG CAPS  09/01/20   [provider]  OVER THE COUNTER MEDICATION BLUEBERRY 8000mg  Patient not taking: Reported on 01/21/2022 09/01/20   [provider]   Social History   Socioeconomic History   Marital status: Divorced    Spouse name: Not on file   Number of children: 1   Years of education: Not on file   Highest education level: Not on file  Occupational History   Occupation: disabled  Tobacco Use   Smoking status: Never   Smokeless tobacco: Former    Types: Chew   Tobacco comments:    Quit age 61 y.o  Vaping Use   Vaping Use: Never used  Substance and Sexual Activity   Alcohol use: No   Drug use: No   Sexual activity: Not Currently  Other Topics Concern   Not on file  Social History Narrative   Not on file   Social Determinants of Health   Financial Resource Strain: Not on file  Food Insecurity: Not on file  Transportation Needs: Not on file  Physical Activity: Not on file  Stress: Not on file  Social Connections: Not on file  Intimate Partner Violence: Not on file    Review of Systems  Constitutional:  Negative for fatigue and unexpected weight change.  Eyes:  Negative for visual disturbance.  Respiratory:  Negative for cough, chest tightness and shortness of breath.   Cardiovascular:  Negative for chest pain, palpitations and leg swelling.  Gastrointestinal:  Negative for abdominal pain and blood in stool.  Neurological:  Negative for dizziness, light-headedness and headaches.     Objective:   Vitals:   01/21/22 0933  BP: 134/82  Pulse: 77  Temp: 98.2 F (36.8 C)  SpO2: 99%  Weight: 218 lb 9.6 oz (99.2 kg)  Height: 5\' 11"  (1.803 m)     Physical Exam Vitals reviewed.  Constitutional:      Appearance: He is well-developed.  HENT:     Head: Normocephalic and atraumatic.  Neck:     Vascular: No  carotid bruit or JVD.   Cardiovascular:     Rate and Rhythm: Normal rate and regular rhythm.     Heart sounds: Normal heart sounds. No murmur heard. Pulmonary:     Effort: Pulmonary effort is normal.     Breath sounds: Normal breath sounds. No rales.  Musculoskeletal:     Right lower leg: No edema.     Left lower leg: No edema.     Comments: Right elbow, pain-free range of motion Right hand, nontender, full range of motion Right wrist, tender over radial styloid, APL/EPB tendons with positive Lourena Simmonds.  No other wrist tenderness.  Overall intact range of motion.  Skin intact without erythema  Skin:    General: Skin is warm and dry.  Neurological:     Mental Status: He is alert and oriented to person, place, and time.  Psychiatric:        Mood and Affect: Mood normal.        Assessment & Plan:  Shawn Ball. is a 61 y.o. male . Right wrist pain - Plan: Ambulatory referral to Hand Surgery, DG Wrist Complete Right, CANCELED: Ambulatory referral to Hand Surgery, CANCELED: DG Wrist Complete Right De Quervain's disease (tenosynovitis) - Plan: Ambulatory referral to Hand Surgery, DG Wrist Complete Right, CANCELED: Ambulatory referral to Hand Surgery, CANCELED: DG Wrist Complete Right  Possible de Quervain's tenosynovitis.  Check imaging and some pain over radial styloid.  Due to timing of symptoms will refer to hand specialist for evaluation, possible injection.  Information provided by separate message to patient after visit on condition.  RTC precautions.  Benign essential HTN - Plan: losartan (COZAAR) 100 MG tablet, carvedilol (COREG) 6.25 MG tablet  -Stable on current dose losartan, carvedilol, continue same  Gastroesophageal reflux disease, unspecified whether esophagitis present  -Stable with PPI, continue same.  Mixed hyperlipidemia - Plan: simvastatin (ZOCOR) 20 MG tablet, Comprehensive metabolic panel, Lipid panel  -Tolerating combo meds including Zocor 20 mg daily, continue same.  Type  2 diabetes mellitus with stage 2 chronic kidney disease, without long-term current use of insulin (HCC) - Plan: linagliptin (TRADJENTA) 5 MG TABS tablet, Comprehensive metabolic panel, Hemoglobin A1c  -Doing well with recent start of Tradjenta.  Check A1c, CMP, new prescription given.  Labs as above.  Meds ordered this encounter  Medications   losartan (COZAAR) 100 MG tablet    Sig: Take 1 tablet (100 mg total) by mouth daily.    Dispense:  90 tablet    Refill:  2   simvastatin (ZOCOR) 20 MG tablet    Sig: Take 1 tablet (20 mg total) by mouth at bedtime.    Dispense:  90 tablet    Refill:  2   carvedilol (COREG) 6.25 MG tablet    Sig: Take 1 tablet (6.25 mg total) by mouth 2 (two) times daily with a meal.    Dispense:  180 tablet    Refill:  2   linagliptin (TRADJENTA) 5 MG TABS tablet    Sig: Take 1 tablet (5 mg total) by mouth daily.    Dispense:  90 tablet    Refill:  2   Patient Instructions  Glad to hear that you are tolerating medications.  No changes for today.  94-month follow-up as long as labs look okay.  Wrist pain could be de Quervain's, see information below.  Over-the-counter brace temporarily is okay as long as you come out of that intermittently for range of motion.  I will refer you  to hand surgeon but please have x-ray performed tomorrow if possible.  If any concerns on x-ray I will let you know.  If any other concerns or new concerns, please schedule visit.  Thanks for coming in to see me today.  Take care and Happy Birthday!    Signed,   Meredith Staggers, MD [facility], [facility] 01/21/22 9:42 AM

## 2022-01-21 NOTE — Patient Instructions (Signed)
Glad to hear that you are tolerating medications.  No changes for today.  15-month follow-up as long as labs look okay.  Wrist pain could be de Quervain's, see information below.  Over-the-counter brace temporarily is okay as long as you come out of that intermittently for range of motion.  I will refer you to hand surgeon but please have x-ray performed tomorrow if possible.  If any concerns on x-ray I will let you know.  If any other concerns or new concerns, please schedule visit.  Thanks for coming in to see me today.  Take care and Happy Birthday!

## 2022-01-22 ENCOUNTER — Encounter: Payer: Self-pay | Admitting: Family Medicine

## 2022-01-22 ENCOUNTER — Ambulatory Visit (INDEPENDENT_AMBULATORY_CARE_PROVIDER_SITE_OTHER)
Admission: RE | Admit: 2022-01-22 | Discharge: 2022-01-22 | Disposition: A | Discharge status: Home / Self Care | Payer: 59 | Source: Ambulatory Visit | Attending: Family Medicine | Admitting: Family Medicine

## 2022-01-22 DIAGNOSIS — M25531 Pain in right wrist: Secondary | ICD-10-CM | POA: Diagnosis not present

## 2022-01-22 DIAGNOSIS — M654 Radial styloid tenosynovitis [de Quervain]: Secondary | ICD-10-CM | POA: Diagnosis not present

## 2022-01-31 ENCOUNTER — Emergency Department (HOSPITAL_BASED_OUTPATIENT_CLINIC_OR_DEPARTMENT_OTHER): Payer: 59

## 2022-01-31 ENCOUNTER — Emergency Department (HOSPITAL_BASED_OUTPATIENT_CLINIC_OR_DEPARTMENT_OTHER)
Admission: EM | Admit: 2022-01-31 | Discharge: 2022-01-31 | Disposition: A | Discharge status: Home / Self Care | Payer: 59 | Attending: Emergency Medicine | Admitting: Emergency Medicine

## 2022-01-31 ENCOUNTER — Encounter (HOSPITAL_BASED_OUTPATIENT_CLINIC_OR_DEPARTMENT_OTHER): Payer: Self-pay

## 2022-01-31 ENCOUNTER — Ambulatory Visit (INDEPENDENT_AMBULATORY_CARE_PROVIDER_SITE_OTHER): Payer: 59 | Admitting: Family Medicine

## 2022-01-31 ENCOUNTER — Encounter: Payer: Self-pay | Admitting: Family Medicine

## 2022-01-31 ENCOUNTER — Other Ambulatory Visit: Payer: Self-pay

## 2022-01-31 VITALS — BP 122/82 | HR 88 | Temp 98.1°F | Ht 71.0 in | Wt 215.8 lb

## 2022-01-31 DIAGNOSIS — R1013 Epigastric pain: Secondary | ICD-10-CM

## 2022-01-31 DIAGNOSIS — Z7982 Long term (current) use of aspirin: Secondary | ICD-10-CM | POA: Insufficient documentation

## 2022-01-31 DIAGNOSIS — R112 Nausea with vomiting, unspecified: Secondary | ICD-10-CM | POA: Diagnosis not present

## 2022-01-31 DIAGNOSIS — R1012 Left upper quadrant pain: Secondary | ICD-10-CM | POA: Diagnosis not present

## 2022-01-31 DIAGNOSIS — R1032 Left lower quadrant pain: Secondary | ICD-10-CM | POA: Diagnosis not present

## 2022-01-31 DIAGNOSIS — R509 Fever, unspecified: Secondary | ICD-10-CM

## 2022-01-31 DIAGNOSIS — R1084 Generalized abdominal pain: Secondary | ICD-10-CM | POA: Diagnosis not present

## 2022-01-31 LAB — URINALYSIS, ROUTINE W REFLEX MICROSCOPIC
Bilirubin Urine: NEGATIVE
Glucose, UA: NEGATIVE mg/dL
Hgb urine dipstick: NEGATIVE
Ketones, ur: NEGATIVE mg/dL
Leukocytes,Ua: NEGATIVE
Nitrite: NEGATIVE
Protein, ur: NEGATIVE mg/dL
Specific Gravity, Urine: 1.009 (ref 1.005–1.030)
pH: 6 (ref 5.0–8.0)

## 2022-01-31 LAB — COMPREHENSIVE METABOLIC PANEL
ALT: 18 U/L (ref 0–44)
AST: 13 U/L — ABNORMAL LOW (ref 15–41)
Albumin: 4.1 g/dL (ref 3.5–5.0)
Alkaline Phosphatase: 77 U/L (ref 38–126)
Anion gap: 8 (ref 5–15)
BUN: 18 mg/dL (ref 6–20)
CO2: 27 mmol/L (ref 22–32)
Calcium: 9.2 mg/dL (ref 8.9–10.3)
Chloride: 104 mmol/L (ref 98–111)
Creatinine, Ser: 1.14 mg/dL (ref 0.61–1.24)
GFR, Estimated: >60 mL/min (ref >60)
Glucose, Bld: 102 mg/dL — ABNORMAL HIGH (ref 70–99)
Potassium: 4.2 mmol/L (ref 3.5–5.1)
Sodium: 139 mmol/L (ref 135–145)
Total Bilirubin: 0.6 mg/dL (ref 0.3–1.2)
Total Protein: 6.7 g/dL (ref 6.5–8.1)

## 2022-01-31 LAB — CBC
HCT: 45 % (ref 39.0–52.0)
Hemoglobin: 15.4 g/dL (ref 13.0–17.0)
MCH: 29.7 pg (ref 26.0–34.0)
MCHC: 34.2 g/dL (ref 30.0–36.0)
MCV: 86.7 fL (ref 80.0–100.0)
Platelets: 220 10*3/uL (ref 150–400)
RBC: 5.19 MIL/uL (ref 4.22–5.81)
RDW: 12.7 % (ref 11.5–15.5)
WBC: 9.7 10*3/uL (ref 4.0–10.5)
nRBC: 0 % (ref 0.0–0.2)

## 2022-01-31 LAB — LIPASE, BLOOD: Lipase: 31 U/L (ref 11–51)

## 2022-01-31 MED ORDER — CIPROFLOXACIN HCL 500 MG PO TABS: 500.0000 mg | ORAL_TABLET | Freq: Two times a day (BID) | ORAL | 0 refills | Status: DC

## 2022-01-31 MED ORDER — METRONIDAZOLE 500 MG PO TABS: 500.0000 mg | ORAL_TABLET | Freq: Two times a day (BID) | ORAL | 0 refills | Status: DC

## 2022-01-31 NOTE — ED Provider Notes (Signed)
[facility] EMERGENCY DEPT Provider Note   CSN: [id number] Arrival date & time: 01/31/22  1627     History Chief Complaint  Patient presents with   Abdominal Pain    Shawn Ball. is a 61 y.o. male patient with history of diverticulitis who presents to the emergency department today for evaluation of generalized abdominal pain and 1 episode of vomiting.  Abdominal pain started almost a week ago shortly after Thanksgiving.  Patient does state this feels similar to his diverticulitis that is flared in the past.  He has also had some associated constipation.  No diarrhea.  He has been self treating with elderberry which seem to improve his abdominal pain for some time but last night he got more severe and had associated chills and subjective fever.  Patient had 1 episode of vomiting today.  He was seen evaluated by his primary care doctor who sent him over here for lab work and scans.   Abdominal Pain      Home Medications Prior to Admission medications   Medication Sig Start Date End Date Taking? Authorizing Provider  ciprofloxacin (CIPRO) 500 MG tablet Take 1 tablet (500 mg total) by mouth every 12 (twelve) hours. 01/31/22  Yes Meredeth Ide, Helio Lack M, PA-C  metroNIDAZOLE (FLAGYL) 500 MG tablet Take 1 tablet (500 mg total) by mouth 2 (two) times daily. 01/31/22  Yes Jasha Hodzic M, PA-C  ASPIRIN 81 PO Take 1 tablet by mouth daily.    [provider]  Biotin 5000 MCG CAPS 2 times a week 12/03/14   [provider]  carvedilol (COREG) 12.5 MG tablet     [provider]  carvedilol (COREG) 6.25 MG tablet Take 1 tablet (6.25 mg total) by mouth 2 (two) times daily with a meal. 01/21/22   Shade Flood, MD  cetirizine (ZYRTEC) 10 MG tablet Take 1 tablet (10 mg total) by mouth daily. 10/28/19   Donita Brooks, MD  Cetirizine HCl (ZYRTEC ALLERGY) 10 MG CAPS Take by oral route.    [provider]  Collagen Hydrolysate, Bovine, POWD by  Does not apply route. Takes Tour manager" for arthritis    [provider]  FIBER COMPLETE PO Take by mouth. Vita Fusion fiber gummies    [provider]  fluticasone (FLONASE) 50 MCG/ACT nasal spray Place 2 sprays into both nostrils daily. 04/27/18   Donita Brooks, MD  Garlic Oil 1000 MG CAPS  10/02/20   [provider]  Glucosamine-Chondroitin-MSM 500-200-150 MG TABS Take by mouth.    [provider]  glucose blood (ONETOUCH VERIO) test strip CHECK BLOOD SUGARS IN THE MORNING AND AT BEDTIME. 07/19/21   Shade Flood, MD  L-Lysine 1000 MG TABS Take by mouth. Patient not taking: Reported on 01/21/2022    [provider]  L-Lysine 1000 MG TABS  03/04/12   [provider]  Lancets (FREESTYLE) lancets Use as instructed 10/28/19   Donita Brooks, MD  linagliptin (TRADJENTA) 5 MG TABS tablet Take 1 tablet (5 mg total) by mouth daily. 01/21/22   Shade Flood, MD  losartan (COZAAR) 100 MG tablet Take 1 tablet (100 mg total) by mouth daily. 01/21/22   Shade Flood, MD  losartan (COZAAR) 100 MG tablet Take 1 tablet by mouth daily.    [provider]  MAGNESIUM OXIDE PO Take 375 mg by mouth daily.    [provider]  mometasone (ELOCON) 0.1 % ointment Apply topically daily.    [provider]  mometasone-formoterol (DULERA) 100-5 MCG/ACT AERO Inhale 2 puffs into the lungs 2 (two) times daily.    [provider]  Multiple Vitamins-Minerals (VISION FORMULA EYE HEALTH PO)  06/02/17   [provider]  omega-3 acid ethyl esters (LOVAZA) 1 g capsule Take 2 capsules (2 g total) by mouth 2 (two) times daily. 08/29/21   Shade FloodGreene, Jeffrey R, MD  omeprazole (PRILOSEC) 20 MG capsule Take 1 capsule (20 mg total) by mouth 2 (two) times daily before a meal. 08/29/21   Shade FloodGreene, Jeffrey R, MD  simvastatin (ZOCOR) 20 MG tablet Take 1 tablet (20 mg total) by mouth at bedtime. 01/21/22   Shade FloodGreene, Jeffrey R, MD      Allergies     Amlodipine, Codeine, Ivp dye [iodinated contrast media], Penicillins, and Sulfa antibiotics    Review of Systems   Review of Systems  Gastrointestinal:  Positive for abdominal pain.  All other systems reviewed and are negative.   Physical Exam Updated Vital Signs BP 118/83   Pulse 72   Temp 97.7 F (36.5 C) (Oral)   Resp 18   Ht 5\' 11"  (1.803 m)   Wt 97.9 kg   SpO2 98%   BMI 30.10 kg/m  Physical Exam Vitals and nursing note reviewed.  Constitutional:      General: He is not in acute distress.    Appearance: Normal appearance.  HENT:     Head: Normocephalic and atraumatic.  Eyes:     General:        Right eye: No discharge.        Left eye: No discharge.  Cardiovascular:     Comments: Regular rate and rhythm.  S1/S2 are distinct without any evidence of murmur, rubs, or gallops.  Radial pulses are 2+ bilaterally.  Dorsalis pedis pulses are 2+ bilaterally.  No evidence of pedal edema. Pulmonary:     Comments: Clear to auscultation bilaterally.  Normal effort.  No respiratory distress.  No evidence of wheezes, rales, or rhonchi heard throughout. Abdominal:     General: Abdomen is flat. Bowel sounds are normal. There is no distension.     Tenderness: There is generalized abdominal tenderness. There is no guarding or rebound.  Musculoskeletal:        General: Normal range of motion.     Cervical back: Neck supple.  Skin:    General: Skin is warm and dry.     Findings: No rash.  Neurological:     General: No focal deficit present.     Mental Status: He is alert.  Psychiatric:        Mood and Affect: Mood normal.        Behavior: Behavior normal.     ED Results / Procedures / Treatments   Labs (all labs ordered are listed, but only abnormal results are displayed) Labs Reviewed  COMPREHENSIVE METABOLIC PANEL - Abnormal; Notable for the following components:      Result Value   Glucose, Bld 102 (*)    AST 13 (*)    All other components within normal limits   LIPASE, BLOOD  CBC  URINALYSIS, ROUTINE W REFLEX MICROSCOPIC    EKG None  Radiology CT ABDOMEN PELVIS WO CONTRAST  Result Date: 01/31/2022 CLINICAL DATA:  Acute abdominal pain for several days, initial encounter EXAM: CT ABDOMEN AND PELVIS WITHOUT CONTRAST TECHNIQUE: Multidetector CT imaging of the abdomen and pelvis was performed following the standard protocol without IV contrast. RADIATION DOSE REDUCTION: This exam was performed  according to the departmental dose-optimization program which includes automated exposure control, adjustment of the mA and/or kV according to patient size and/or use of iterative reconstruction technique. COMPARISON:  None Available. FINDINGS: Lower chest: No acute abnormality. Hepatobiliary: No focal liver abnormality is seen. No gallstones, gallbladder wall thickening, or biliary dilatation. Pancreas: Unremarkable. No pancreatic ductal dilatation or surrounding inflammatory changes. Spleen: Normal in size without focal abnormality. Adrenals/Urinary Tract: Adrenal glands are within normal limits. Kidneys demonstrate a normal appearance without renal calculi or obstructive changes. The bladder is well distended. Stomach/Bowel: Minimal diverticular change of the colon is noted without evidence of diverticulitis. The appendix is air-filled and within normal limits. Small bowel and stomach are unremarkable. Vascular/Lymphatic: Aortic atherosclerosis. No enlarged abdominal or pelvic lymph nodes. Reproductive: Prostate is unremarkable. Other: No abdominal wall hernia or abnormality. No abdominopelvic ascites. Musculoskeletal: No acute or significant osseous findings. IMPRESSION: Mild diverticular change without diverticulitis. No other focal abnormality is noted to correspond with the given clinical history. Electronically Signed   By: Alcide Clever M.D.   On: 01/31/2022 19:09    Procedures Procedures    Medications Ordered in ED Medications - No data to display  ED  Course/ Medical Decision Making/ A&P Clinical Course as of 01/31/22 2020  Thu Jan 31, 2022  2012 CBC Normal. [CF]  2012 Lipase, blood Normal. [CF]  2013 Comprehensive metabolic panel(!) Normal. [CF]  2013 Urinalysis, Routine w reflex microscopic Urine, Clean Catch Normal. [CF]  2013 CT ABDOMEN PELVIS WO CONTRAST I personally ordered and interpreted the study and do not see any evidence of acute diverticulitis however this could be early.  Given the clinical scenario, I will likely give him antibiotics and have him wait 24 hours to see if he improves. [CF]    Clinical Course User Index [CF] Teressa Lower, PA-C                           Medical Decision Making Shawn Ball. is a 61 y.o. male patient who presents to the emergency room today for further evaluation of generalized abdominal pain.  Given the patient's history and physical exam I am concerned for diverticulitis.  Patient does have a contrast allergy and I will plan to get a CT scan without contrast in addition to abdominal pain labs.  Offered patient pain medication which she ultimately declined at this time.  Patient is in no acute distress.  Based on the CT scan there is no clear evidence of diverticulitis at this time.  Given the clinical scenario however, I am going to write him antibiotics for diverticulitis as there is some mild diverticular changes.  This could be that we caught this early.  I will have him follow-up with his primary care doctor for further evaluation.  Vital signs are normal.  Strict turn precautions were discussed.  He is safe for discharge at this time.  Amount and/or Complexity of Data Reviewed Labs: ordered. Decision-making details documented in ED Course. Radiology: ordered. Decision-making details documented in ED Course.  Risk Prescription drug management.    Final Clinical Impression(s) / ED Diagnoses Final diagnoses:  Generalized abdominal pain    Rx / DC Orders ED Discharge  Orders          Ordered    metroNIDAZOLE (FLAGYL) 500 MG tablet  2 times daily        01/31/22 2019    ciprofloxacin (CIPRO) 500 MG tablet  Every 12  hours        01/31/22 2019              Honor Loh Siletz, PA-C 01/31/22 2020    Mardene Sayer, MD 02/01/22 [phone]

## 2022-01-31 NOTE — Progress Notes (Signed)
Subjective:  Patient ID: Shawn Ban., male    DOB: 20-Sep-1960  Age: 61 y.o. MRN: [id number]  CC:  Chief Complaint  Patient presents with   Diverticulitis    Pt states he has had nausea ans threw up one time, stomach hurting and cramping , 1 small BM last night at 1230, and passing gas, pt has also had elderberry syrup     HPI Shawn Ban. presents for   Abdominal pain: Started aching pain day after thanksgiving - 6 days ago. Diffuse pain. No initial fever, n/v. Few bowel movements, but solid that day.  Treated with elderberry syrup lat weekend- improved for a few days then got worse. Sour belching, no vomiting, nausea, more reflux last night. Subjective fever/chills last night. No measured temp.  Ate soup last night, worse overnight. Increased pain, felt bloated last night. Small bowel movement.  Vomited once today this am. No blood. Still having abdominal pain.  Some increased urination but no other difficulty, no hematuria, no back pain.  Several years ago had diverticulitis. Seen at urgent care.  Diverticulosis on prior colonoscopy. Had active diverticulitis years ago.   History Patient Active Problem List   Diagnosis Date Noted   Acute viral conjunctivitis of right eye 08/28/2020   Routine general medical examination at a health care facility 05/01/2020   Hyperlipidemia LDL goal <70 03/15/2020   Hypertriglyceridemia 03/15/2020   Diabetes mellitus without complication (HCC)    Benign essential HTN 08/25/2019   Past Medical History:  Diagnosis Date   Allergy    Arthritis    Chronic bronchitis    Chronic pain    per pt/ right shoulder pain, left leg and ankle   CKD (chronic kidney disease) stage 3, GFR 30-59 ml/min (HCC)    Costochondral chest pain    due to torn tendons.   CTS (carpal tunnel syndrome)    right hand   Diabetes mellitus without complication (HCC)    prediabetes/ no meds   Diverticulitis    GERD (gastroesophageal reflux disease)     History of anal fissures    Hyperlipemia    Hypertension    Rectal bleeding    occasional   Seasonal allergies    Past Surgical History:  Procedure Laterality Date   ANKLE FRACTURE SURGERY  2005 / 2007   left ankle and  left leg/ have screws in leg and ankle   CARPAL TUNNEL RELEASE Left 07/29/2019   Procedure: CARPAL TUNNEL RELEASE;  Surgeon: Cindee Salt, MD;  Location: [facility];  Service: Orthopedics;  Laterality: Left;  FOREARM BLOCK   EXAMINATION UNDER ANESTHESIA N/A 12/15/2012   Procedure: EXAM UNDER ANESTHESIA;  Surgeon: Atilano Ina, MD;  Location: [facility];  Service: General;  Laterality: N/A;   FRACTURE SURGERY  2005 and 2007   lt ankle x2   HEMORRHOIDECTOMY WITH HEMORRHOID BANDING N/A 12/15/2012   Procedure: EXCISIONAL HEMORRHOIDECTOMY WITH HEMORRHOID BANDING;  Surgeon: Atilano Ina, MD;  Location: [facility];  Service: General;  Laterality: N/A;   rotator cuff surgery      x 2/ right shoulder   TRIGGER FINGER RELEASE  07/23/2011   Procedure: Right hand- RELEASE TRIGGER FINGER/A-1 PULLEY;  Surgeon: Wyn Forster., MD;  Location: [facility];  Service: Orthopedics;  Laterality: Right;   WRIST FRACTURE SURGERY  1984   lt with bone graft   WRIST SURGERY     left wrist   Allergies  Allergen Reactions   Amlodipine Other (See Comments)    Per pt - damage to kidney's   Codeine Nausea And Vomiting   Ivp Dye [Iodinated Contrast Media]     Nauseated   Penicillins Nausea And Vomiting    As a child   Sulfa Antibiotics     nauseated   Prior to Admission medications   Medication Sig Start Date End Date Taking? Authorizing Provider  ASPIRIN 81 PO Take 1 tablet by mouth daily.   Yes [provider]  Biotin 5000 MCG CAPS 2 times a week 12/03/14  Yes [provider]  carvedilol (COREG) 6.25 MG tablet Take 1 tablet (6.25 mg total) by mouth 2 (two) times daily with a meal. 01/21/22  Yes Shade Flood, MD  cetirizine (ZYRTEC) 10 MG tablet Take 1 tablet (10 mg total) by mouth daily. 10/28/19  Yes Donita Brooks, MD  Collagen Hydrolysate, Bovine, POWD by Does not apply route. Takes Tour manager" for arthritis   Yes [provider]  FIBER COMPLETE PO Take by mouth. Vita Fusion fiber gummies   Yes [provider]  fluticasone (FLONASE) 50 MCG/ACT nasal spray Place 2 sprays into both nostrils daily. 04/27/18  Yes Donita Brooks, MD  Garlic Oil 1000 MG CAPS  10/02/20  Yes [provider]  Glucosamine-Chondroitin-MSM 500-200-150 MG TABS Take by mouth.   Yes [provider]  glucose blood (ONETOUCH VERIO) test strip CHECK BLOOD SUGARS IN THE MORNING AND AT BEDTIME. 07/19/21  Yes Shade Flood, MD  Lancets (FREESTYLE) lancets Use as instructed 10/28/19  Yes Donita Brooks, MD  linagliptin (TRADJENTA) 5 MG TABS tablet Take 1 tablet (5 mg total) by mouth daily. 01/21/22  Yes Shade Flood, MD  losartan (COZAAR) 100 MG tablet Take 1 tablet (100 mg total) by mouth daily. 01/21/22  Yes Shade Flood, MD  MAGNESIUM OXIDE PO Take 375 mg by mouth daily.   Yes [provider]  mometasone (ELOCON) 0.1 % ointment Apply topically daily.   Yes [provider]  mometasone-formoterol (DULERA) 100-5 MCG/ACT AERO Inhale 2 puffs into the lungs 2 (two) times daily.   Yes [provider]  Multiple Vitamins-Minerals (VISION FORMULA EYE HEALTH PO)  06/02/17  Yes [provider]  omega-3 acid ethyl esters (LOVAZA) 1 g capsule Take 2 capsules (2 g total) by mouth 2 (two) times daily. 08/29/21  Yes Shade Flood, MD  omeprazole (PRILOSEC) 20 MG capsule Take 1 capsule (20 mg total) by mouth 2 (two) times daily before a meal. 08/29/21  Yes Shade Flood, MD  simvastatin (ZOCOR) 20 MG tablet Take 1 tablet (20 mg total) by mouth at bedtime. 01/21/22  Yes Shade Flood, MD  carvedilol (COREG) 12.5 MG tablet     [provider]   Cetirizine HCl (ZYRTEC ALLERGY) 10 MG CAPS Take by oral route.    [provider]  L-Lysine 1000 MG TABS Take by mouth. Patient not taking: Reported on 01/21/2022    [provider]  L-Lysine 1000 MG TABS  03/04/12   [provider]  losartan (COZAAR) 100 MG tablet Take 1 tablet by mouth daily.    [provider]   Social History   Socioeconomic History   Marital status: Divorced    Spouse name: Not on file   Number of children: 1   Years of education: Not on file   Highest education level: Not on file  Occupational History   Occupation:  disabled  Tobacco Use   Smoking status: Never   Smokeless tobacco: Former    Types: Chew   Tobacco comments:    Quit age 61 y.o  Vaping Use   Vaping Use: Never used  Substance and Sexual Activity   Alcohol use: No   Drug use: No   Sexual activity: Not Currently  Other Topics Concern   Not on file  Social History Narrative   Not on file   Social Determinants of Health   Financial Resource Strain: Not on file  Food Insecurity: Not on file  Transportation Needs: Not on file  Physical Activity: Not on file  Stress: Not on file  Social Connections: Not on file  Intimate Partner Violence: Not on file    Review of Systems   Objective:   Vitals:   01/31/22 1536  BP: 122/82  Pulse: 88  Temp: 98.1 F (36.7 C)  SpO2: 97%  Weight: 215 lb 12.8 oz (97.9 kg)  Height: 5\' 11"  (1.803 m)     Physical Exam Vitals reviewed.  Constitutional:      Appearance: He is well-developed.  HENT:     Head: Normocephalic and atraumatic.  Neck:     Vascular: No carotid bruit or JVD.  Cardiovascular:     Rate and Rhythm: Normal rate and regular rhythm.     Heart sounds: Normal heart sounds. No murmur heard. Pulmonary:     Effort: Pulmonary effort is normal.     Breath sounds: Normal breath sounds. No rales.  Abdominal:     General: Abdomen is flat. Bowel sounds are normal. There is no distension.      Tenderness: There is abdominal tenderness (Right upper quadrant, epigastric, left upper and left lower quadrant with guarding.) in the right upper quadrant, epigastric area, left upper quadrant and left lower quadrant. There is guarding. There is no right CVA tenderness or left CVA tenderness. Negative signs include McBurney's sign.  Musculoskeletal:     Right lower leg: No edema.     Left lower leg: No edema.  Skin:    General: Skin is warm and dry.  Neurological:     Mental Status: He is alert and oriented to person, place, and time.  Psychiatric:        Mood and Affect: Mood normal.        Assessment & Plan:  Shawn BanJoseph R Nafziger Jr. is a 61 y.o. male . LLQ abdominal pain  Abdominal pain, LUQ (left upper quadrant)  Epigastric abdominal pain  Nausea and vomiting, unspecified vomiting type  Fever and chills Approximately 6-day history of left lower quadrant abdominal pain, nausea, progressive pain to epigastric and right upper quadrant abdominal pain after some initial improvement.  Now with vomiting this morning, persistent pain today and guarding.  Subjective fever and chills last night.  Reported history of diverticulitis.  Concern for infectious process with some initial improvement then worsening, history of diverticulitis, concern for possible recurrence with worsening, but with vomiting and upper abdominal pain will need further evaluation to rule out gallbladder, pancreas source.  Given worsening symptoms above and likely need for imaging other acute testing tonight, plan for evaluation through emergency room.  Plan discussed with patient with all questions answered and understanding of plan expressed.  No orders of the defined types were placed in this encounter.  Patient Instructions  I am concerned about the location of pain as well as the worsening pain after initial improvement.  With the fevers and chills last  night, vomiting today I do think you should be seen in the  emergency room today for further evaluation, testing and possible imaging with a CT scan.  Please head there after leaving our office.  I will put some notes in the system for them to review.  Hope you feel better soon.  Hang in there!    Signed,   Meredith Staggers, MD [facility], [facility] 01/31/22 4:12 PM

## 2022-01-31 NOTE — ED Triage Notes (Signed)
Patient here POV from PCP Office.  Endorses ABD Pain since Thanksgiving. Became somewhat better after some Elderberry Syrup but worsened over Yesterday PM. Definite Chills and Possible Fever. Some N/V.  No Diarrhea.   NAD Noted during Triage. A&Ox4. GCS 15. Ambulatory.

## 2022-01-31 NOTE — Discharge Instructions (Addendum)
Please fill these prescriptions for tomorrow but wait 24 hours to see if you improved.  I would like for you to follow-up with your primary care doctor for further evaluation.  As we discussed, stick to a liquid diet with soup broth and water.  Slowly incorporate your diet over the next week.  Please return to the emerged department for any worsening symptoms you might have.

## 2022-01-31 NOTE — Patient Instructions (Addendum)
I am concerned about the location of pain as well as the worsening pain after initial improvement.  With the fevers and chills last night, vomiting today I do think you should be seen in the emergency room today for further evaluation, testing and possible imaging with a CT scan.  Please head there after leaving our office.  I will put some notes in the system for them to review.  Hope you feel better soon.  Hang in there!

## 2022-02-05 ENCOUNTER — Encounter: Payer: Self-pay | Admitting: Family Medicine

## 2022-02-07 ENCOUNTER — Ambulatory Visit: Payer: 59 | Admitting: Family Medicine

## 2022-02-07 ENCOUNTER — Encounter (HOSPITAL_COMMUNITY): Payer: Self-pay

## 2022-02-07 ENCOUNTER — Ambulatory Visit (HOSPITAL_COMMUNITY)
Admission: EM | Admit: 2022-02-07 | Discharge: 2022-02-07 | Disposition: A | Discharge status: Home / Self Care | Payer: 59 | Attending: Internal Medicine | Admitting: Internal Medicine

## 2022-02-07 DIAGNOSIS — K2900 Acute gastritis without bleeding: Secondary | ICD-10-CM | POA: Diagnosis not present

## 2022-02-07 MED ORDER — ONDANSETRON 4 MG PO TBDP
ORAL_TABLET | ORAL | Status: AC
  Filled 2022-02-07: qty 1

## 2022-02-07 MED ORDER — ONDANSETRON 4 MG PO TBDP
4.0000 mg | ORAL_TABLET | Freq: Once | ORAL | Status: AC
  Administered 2022-02-07: 4 mg via ORAL

## 2022-02-07 MED ORDER — SUCRALFATE 1 G PO TABS: 1.0000 g | ORAL_TABLET | Freq: Three times a day (TID) | ORAL | 0 refills | Status: DC

## 2022-02-07 MED ORDER — ONDANSETRON 4 MG PO TBDP: 4.0000 mg | ORAL_TABLET | Freq: Three times a day (TID) | ORAL | 0 refills | Status: DC | PRN

## 2022-02-07 NOTE — Telephone Encounter (Signed)
Scheduled for 2:20 this afternoon

## 2022-02-07 NOTE — ED Triage Notes (Signed)
Pt c/o center abdominal pain with vomiting x2 weeks intermittent. States was treated for diverticulitis a week ago and completed antibiotics today. States still feeling the same.

## 2022-02-07 NOTE — Discharge Instructions (Addendum)
Please take medications as prescribed Avoid caffeinated drinks, spicy food and citrus fruits Continue taking omeprazole as prescribed. If you have persistent or worsening pain please return to urgent care to be reevaluated

## 2022-02-08 NOTE — ED Provider Notes (Addendum)
[facility]    CSN: [id number] Arrival date & time: 02/07/22  1117      History   Chief Complaint Chief Complaint  Patient presents with   Abdominal Pain   Emesis    HPI Shawn Ball. is a 61 y.o. male comes to urgent care with complaints of abdominal pain and vomiting of 2 weeks duration.  Patient was seen in the emergency department and treated for mild colitis based on the CT scan of the abdomen.  Patient was prescribed ciprofloxacin and metronidazole.  He started the metronidazole a couple of days after starting Cipro.  He started having vomiting after taking metronidazole.  Patient describes abdominal pain is mainly in the half of the abdomen, aggravated by certain types of food and denies any relieving factors.  No abdominal distention or bloating.  He endorses frequent belching and an acidic taste in his mouth.  Symptoms are worse at nighttime.  He has had some constipation which has been relieved with laxatives.  No blood in stool.  No blood in vomitus.  Patient endorses an episode of bilious vomiting last night.  No yellowing of his eyes.  No fever or chills.  No diarrhea. Patient has a history of gastroesophageal reflux disease currently taking omeprazole.  Patient appears to be taking the omeprazole inconsistently with that way its intended to be taking. HPI  Past Medical History:  Diagnosis Date   Allergy    Arthritis    Chronic bronchitis    Chronic pain    per pt/ right shoulder pain, left leg and ankle   CKD (chronic kidney disease) stage 3, GFR 30-59 ml/min (HCC)    Costochondral chest pain    due to torn tendons.   CTS (carpal tunnel syndrome)    right hand   Diabetes mellitus without complication (HCC)    prediabetes/ no meds   Diverticulitis    GERD (gastroesophageal reflux disease)    History of anal fissures    Hyperlipemia    Hypertension    Rectal bleeding    occasional   Seasonal allergies     Patient Active Problem List    Diagnosis Date Noted   Acute viral conjunctivitis of right eye 08/28/2020   Routine general medical examination at a health care facility 05/01/2020   Hyperlipidemia LDL goal <70 03/15/2020   Hypertriglyceridemia 03/15/2020   Diabetes mellitus without complication (HCC)    Benign essential HTN 08/25/2019    Past Surgical History:  Procedure Laterality Date   ANKLE FRACTURE SURGERY  2005 / 2007   left ankle and  left leg/ have screws in leg and ankle   CARPAL TUNNEL RELEASE Left 07/29/2019   Procedure: CARPAL TUNNEL RELEASE;  Surgeon: Cindee Salt, MD;  Location: [facility];  Service: Orthopedics;  Laterality: Left;  FOREARM BLOCK   EXAMINATION UNDER ANESTHESIA N/A 12/15/2012   Procedure: EXAM UNDER ANESTHESIA;  Surgeon: Atilano Ina, MD;  Location: [facility];  Service: General;  Laterality: N/A;   FRACTURE SURGERY  2005 and 2007   lt ankle x2   HEMORRHOIDECTOMY WITH HEMORRHOID BANDING N/A 12/15/2012   Procedure: EXCISIONAL HEMORRHOIDECTOMY WITH HEMORRHOID BANDING;  Surgeon: Atilano Ina, MD;  Location: [facility];  Service: General;  Laterality: N/A;   rotator cuff surgery      x 2/ right shoulder   TRIGGER FINGER RELEASE  07/23/2011   Procedure: Right hand- RELEASE TRIGGER FINGER/A-1 PULLEY;  Surgeon: Wyn Forster.,  MD;  Location: [facility];  Service: Orthopedics;  Laterality: Right;   WRIST FRACTURE SURGERY  1984   lt with bone graft   WRIST SURGERY     left wrist       Home Medications    Prior to Admission medications   Medication Sig Start Date End Date Taking? Authorizing Provider  ondansetron (ZOFRAN-ODT) 4 MG disintegrating tablet Take 1 tablet (4 mg total) by mouth every 8 (eight) hours as needed for nausea or vomiting. 02/07/22  Yes Rickell Wiehe, Britta Mccreedy, MD  sucralfate (CARAFATE) 1 g tablet Take 1 tablet (1 g total) by mouth 3 (three) times daily before meals. 02/07/22  Yes Aune Adami, Britta Mccreedy, MD  ASPIRIN  81 PO Take 1 tablet by mouth daily.    [provider]  Biotin 5000 MCG CAPS 2 times a week 12/03/14   [provider]  carvedilol (COREG) 12.5 MG tablet     [provider]  carvedilol (COREG) 6.25 MG tablet Take 1 tablet (6.25 mg total) by mouth 2 (two) times daily with a meal. 01/21/22   Shade Flood, MD  cetirizine (ZYRTEC) 10 MG tablet Take 1 tablet (10 mg total) by mouth daily. 10/28/19   Donita Brooks, MD  Cetirizine HCl (ZYRTEC ALLERGY) 10 MG CAPS Take by oral route.    [provider]  ciprofloxacin (CIPRO) 500 MG tablet Take 1 tablet (500 mg total) by mouth every 12 (twelve) hours. 01/31/22   Honor Loh M, PA-C  Collagen Hydrolysate, Bovine, POWD by Does not apply route. Takes Tour manager" for arthritis    [provider]  FIBER COMPLETE PO Take by mouth. Vita Fusion fiber gummies    [provider]  fluticasone (FLONASE) 50 MCG/ACT nasal spray Place 2 sprays into both nostrils daily. 04/27/18   Donita Brooks, MD  Garlic Oil 1000 MG CAPS  10/02/20   [provider]  Glucosamine-Chondroitin-MSM 500-200-150 MG TABS Take by mouth.    [provider]  glucose blood (ONETOUCH VERIO) test strip CHECK BLOOD SUGARS IN THE MORNING AND AT BEDTIME. 07/19/21   Shade Flood, MD  L-Lysine 1000 MG TABS Take by mouth. Patient not taking: Reported on 01/21/2022    [provider]  L-Lysine 1000 MG TABS  03/04/12   [provider]  Lancets (FREESTYLE) lancets Use as instructed 10/28/19   Donita Brooks, MD  linagliptin (TRADJENTA) 5 MG TABS tablet Take 1 tablet (5 mg total) by mouth daily. 01/21/22   Shade Flood, MD  losartan (COZAAR) 100 MG tablet Take 1 tablet (100 mg total) by mouth daily. 01/21/22   Shade Flood, MD  losartan (COZAAR) 100 MG tablet Take 1 tablet by mouth daily.    [provider]  MAGNESIUM OXIDE PO Take 375 mg by mouth daily.    [provider]   metroNIDAZOLE (FLAGYL) 500 MG tablet Take 1 tablet (500 mg total) by mouth 2 (two) times daily. 01/31/22   Honor Loh M, PA-C  mometasone (ELOCON) 0.1 % ointment Apply topically daily.    [provider]  mometasone-formoterol (DULERA) 100-5 MCG/ACT AERO Inhale 2 puffs into the lungs 2 (two) times daily.    [provider]  Multiple Vitamins-Minerals (VISION FORMULA EYE HEALTH PO)  06/02/17   [provider]  omega-3 acid ethyl esters (LOVAZA) 1 g capsule Take 2 capsules (2 g total) by mouth 2 (two) times daily. 08/29/21   Shade Flood, MD  omeprazole (  PRILOSEC) 20 MG capsule Take 1 capsule (20 mg total) by mouth 2 (two) times daily before a meal. 08/29/21   Shade Flood, MD  simvastatin (ZOCOR) 20 MG tablet Take 1 tablet (20 mg total) by mouth at bedtime. 01/21/22   Shade Flood, MD    Family History Family History  Problem Relation Age of Onset   Heart disease Mother    Hyperlipidemia Mother    Diabetes Mother    Macular degeneration Mother    Cancer Father        skin   Macular degeneration Sister    Diabetes Brother    Colon cancer Neg Hx    Stomach cancer Neg Hx    Esophageal cancer Neg Hx    Rectal cancer Neg Hx     Social History Social History   Tobacco Use   Smoking status: Never   Smokeless tobacco: Former    Types: Chew   Tobacco comments:    Quit age 26 y.o  Vaping Use   Vaping Use: Never used  Substance Use Topics   Alcohol use: No   Drug use: No     Allergies   Amlodipine, Codeine, Ivp dye [iodinated contrast media], Penicillins, and Sulfa antibiotics   Review of Systems Review of Systems As per HPI  Physical Exam Triage Vital Signs ED Triage Vitals  Enc Vitals Group     BP 02/07/22 1442 115/86     Pulse Rate 02/07/22 1442 88     Resp 02/07/22 1442 18     Temp 02/07/22 1442 98.4 F (36.9 C)     Temp Source 02/07/22 1442 Oral     SpO2 02/07/22 1442 98 %     Weight --      Height --      Head  Circumference --      Peak Flow --      Pain Score 02/07/22 1443 5     Pain Loc --      Pain Edu? --      Excl. in GC? --    No data found.  Updated Vital Signs BP 115/86 (BP Location: Left Arm)   Pulse 88   Temp 98.4 F (36.9 C) (Oral)   Resp 18   SpO2 98%   Visual Acuity Right Eye Distance:   Left Eye Distance:   Bilateral Distance:    Right Eye Near:   Left Eye Near:    Bilateral Near:     Physical Exam Vitals and nursing note reviewed.  Constitutional:      Appearance: He is well-developed. He is not ill-appearing.  Cardiovascular:     Rate and Rhythm: Normal rate and regular rhythm.  Abdominal:     General: Bowel sounds are normal.     Palpations: Abdomen is soft. There is no hepatomegaly or splenomegaly.     Tenderness: There is no abdominal tenderness. There is no guarding or rebound. Negative signs include Murphy's sign.     Hernia: No hernia is present.  Neurological:     Mental Status: He is alert.      UC Treatments / Results  Labs (all labs ordered are listed, but only abnormal results are displayed) Labs Reviewed - No data to display  EKG   Radiology No results found.  Procedures Procedures (including critical care time)  Medications Ordered in UC Medications  ondansetron (ZOFRAN-ODT) disintegrating tablet 4 mg (4 mg Oral Given 02/07/22 1513)    Initial Impression / Assessment and  Plan / UC Course  I have reviewed the triage vital signs and the nursing notes.  Pertinent labs & imaging results that were available during my care of the patient were reviewed by me and considered in my medical decision making (see chart for details).     1.  Acute gastritis without hemorrhage: Patient is advised to take omeprazole as prescribed Carafate 1 g 3 times daily Zofran as needed for nausea/vomiting Patient is advised to go on a bland diet until abdominal symptoms improve or resolve If patient had symptoms in spite of of antacids and Carafate,  patient may benefit from gastroenterology evaluation. Adequate hydration recommended Return precautions given. Final Clinical Impressions(s) / UC Diagnoses   Final diagnoses:  Acute superficial gastritis without hemorrhage     Discharge Instructions      Please take medications as prescribed Avoid caffeinated drinks, spicy food and citrus fruits Continue taking omeprazole as prescribed. If you have persistent or worsening pain please return to urgent care to be reevaluated   ED Prescriptions     Medication Sig Dispense Auth. Provider   ondansetron (ZOFRAN-ODT) 4 MG disintegrating tablet Take 1 tablet (4 mg total) by mouth every 8 (eight) hours as needed for nausea or vomiting. 20 tablet Jeanise Durfey, Britta MccreedyPhilip O, MD   sucralfate (CARAFATE) 1 g tablet Take 1 tablet (1 g total) by mouth 3 (three) times daily before meals. 30 tablet Kenishia Plack, Britta MccreedyPhilip O, MD      PDMP not reviewed this encounter.   Merrilee JanskyLamptey, Ayani Ospina O, MD 02/08/22 [id number]    Merrilee JanskyLamptey, Ahijah Devery O, MD 02/08/22 [phone]

## 2022-02-12 ENCOUNTER — Encounter: Payer: Self-pay | Admitting: Family Medicine

## 2022-02-12 ENCOUNTER — Ambulatory Visit
Admission: EM | Admit: 2022-02-12 | Discharge: 2022-02-12 | Disposition: A | Discharge status: Home / Self Care | Payer: 59 | Attending: Family Medicine | Admitting: Family Medicine

## 2022-02-12 DIAGNOSIS — R1030 Lower abdominal pain, unspecified: Secondary | ICD-10-CM

## 2022-02-12 MED ORDER — ONDANSETRON 4 MG PO TBDP: 4.0000 mg | ORAL_TABLET | Freq: Three times a day (TID) | ORAL | 0 refills | Status: DC | PRN

## 2022-02-12 MED ORDER — SUCRALFATE 1 G PO TABS: 1.0000 g | ORAL_TABLET | Freq: Three times a day (TID) | ORAL | 0 refills | Status: DC

## 2022-02-12 NOTE — ED Triage Notes (Signed)
Pt presents with c/o abdominal pain that began after thanksgiving. Pt states he was seen at [facility] and was diagnosed with diverticulitis. Pt states this is a flare up.

## 2022-02-12 NOTE — ED Provider Notes (Signed)
[facility]    CSN: [id number] Arrival date & time: 02/12/22  1514      History   Chief Complaint Chief Complaint  Patient presents with   Abdominal Pain    HPI Shawn Tews. is a 61 y.o. male.   HPI 61 year old male presents with left lower abdominal pain that began after Thanksgiving.  Patient reports was previously diagnosed with diverticulitis at [facility] and believes this is quite possibly the same flareup.  PMH significant for previous diverticulitis, HTN, CKD stage III, and T2DM.  CT of abdomen and pelvis without contrast of 1130/23 revealed mild diverticular change without diverticulitis.  Past Medical History:  Diagnosis Date   Allergy    Arthritis    Chronic bronchitis    Chronic pain    per pt/ right shoulder pain, left leg and ankle   CKD (chronic kidney disease) stage 3, GFR 30-59 ml/min (HCC)    Costochondral chest pain    due to torn tendons.   CTS (carpal tunnel syndrome)    right hand   Diabetes mellitus without complication (HCC)    prediabetes/ no meds   Diverticulitis    GERD (gastroesophageal reflux disease)    History of anal fissures    Hyperlipemia    Hypertension    Rectal bleeding    occasional   Seasonal allergies     Patient Active Problem List   Diagnosis Date Noted   Acute viral conjunctivitis of right eye 08/28/2020   Routine general medical examination at a health care facility 05/01/2020   Hyperlipidemia LDL goal <70 03/15/2020   Hypertriglyceridemia 03/15/2020   Diabetes mellitus without complication (HCC)    Benign essential HTN 08/25/2019    Past Surgical History:  Procedure Laterality Date   ANKLE FRACTURE SURGERY  2005 / 2007   left ankle and  left leg/ have screws in leg and ankle   CARPAL TUNNEL RELEASE Left 07/29/2019   Procedure: CARPAL TUNNEL RELEASE;  Surgeon: Cindee Salt, MD;  Location: [facility];  Service: Orthopedics;  Laterality: Left;  FOREARM BLOCK   EXAMINATION UNDER  ANESTHESIA N/A 12/15/2012   Procedure: EXAM UNDER ANESTHESIA;  Surgeon: Atilano Ina, MD;  Location: [facility];  Service: General;  Laterality: N/A;   FRACTURE SURGERY  2005 and 2007   lt ankle x2   HEMORRHOIDECTOMY WITH HEMORRHOID BANDING N/A 12/15/2012   Procedure: EXCISIONAL HEMORRHOIDECTOMY WITH HEMORRHOID BANDING;  Surgeon: Atilano Ina, MD;  Location: [facility];  Service: General;  Laterality: N/A;   rotator cuff surgery      x 2/ right shoulder   TRIGGER FINGER RELEASE  07/23/2011   Procedure: Right hand- RELEASE TRIGGER FINGER/A-1 PULLEY;  Surgeon: Wyn Forster., MD;  Location: [facility];  Service: Orthopedics;  Laterality: Right;   WRIST FRACTURE SURGERY  1984   lt with bone graft   WRIST SURGERY     left wrist       Home Medications    Prior to Admission medications   Medication Sig Start Date End Date Taking? Authorizing Provider  ASPIRIN 81 PO Take 1 tablet by mouth daily.    [provider]  Biotin 5000 MCG CAPS 2 times a week 12/03/14   [provider]  carvedilol (COREG) 12.5 MG tablet     [provider]  carvedilol (COREG) 6.25 MG tablet Take 1 tablet (6.25 mg total) by mouth 2 (two) times daily  with a meal. 01/21/22   Wendie Agreste, MD  cetirizine (ZYRTEC) 10 MG tablet Take 1 tablet (10 mg total) by mouth daily. 10/28/19   Susy Frizzle, MD  Cetirizine HCl (ZYRTEC ALLERGY) 10 MG CAPS Take by oral route.    [provider]  Collagen Hydrolysate, Bovine, POWD by Does not apply route. Takes Psychologist, counselling" for arthritis    [provider]  FIBER COMPLETE PO Take by mouth. Vita Fusion fiber gummies    [provider]  fluticasone (FLONASE) 50 MCG/ACT nasal spray Place 2 sprays into both nostrils daily. 04/27/18   Susy Frizzle, MD  Garlic Oil 123XX123 MG CAPS  10/02/20   [provider]  Glucosamine-Chondroitin-MSM 500-200-150 MG TABS Take by mouth.     [provider]  glucose blood (ONETOUCH VERIO) test strip CHECK BLOOD SUGARS IN THE MORNING AND AT BEDTIME. 07/19/21   Wendie Agreste, MD  L-Lysine 1000 MG TABS Take by mouth. Patient not taking: Reported on 01/21/2022    [provider]  L-Lysine 1000 MG TABS  03/04/12   [provider]  Lancets (FREESTYLE) lancets Use as instructed 10/28/19   Susy Frizzle, MD  linagliptin (TRADJENTA) 5 MG TABS tablet Take 1 tablet (5 mg total) by mouth daily. 01/21/22   Wendie Agreste, MD  losartan (COZAAR) 100 MG tablet Take 1 tablet (100 mg total) by mouth daily. 01/21/22   Wendie Agreste, MD  losartan (COZAAR) 100 MG tablet Take 1 tablet by mouth daily.    [provider]  MAGNESIUM OXIDE PO Take 375 mg by mouth daily.    [provider]  mometasone (ELOCON) 0.1 % ointment Apply topically daily.    [provider]  mometasone-formoterol (DULERA) 100-5 MCG/ACT AERO Inhale 2 puffs into the lungs 2 (two) times daily.    [provider]  Multiple Vitamins-Minerals ([place])  06/02/17   [provider]  omega-3 acid ethyl esters (LOVAZA) 1 g capsule Take 2 capsules (2 g total) by mouth 2 (two) times daily. 08/29/21   Wendie Agreste, MD  omeprazole (PRILOSEC) 20 MG capsule Take 1 capsule (20 mg total) by mouth 2 (two) times daily before a meal. 08/29/21   Wendie Agreste, MD  ondansetron (ZOFRAN-ODT) 4 MG disintegrating tablet Take 1 tablet (4 mg total) by mouth every 8 (eight) hours as needed for nausea or vomiting. 02/12/22   Eliezer Lofts, FNP  simvastatin (ZOCOR) 20 MG tablet Take 1 tablet (20 mg total) by mouth at bedtime. 01/21/22   Wendie Agreste, MD  sucralfate (CARAFATE) 1 g tablet Take 1 tablet (1 g total) by mouth 3 (three) times daily before meals. 02/12/22   Eliezer Lofts, FNP    Family History Family History  Problem Relation Age of Onset   Heart disease Mother    Hyperlipidemia Mother     Diabetes Mother    Macular degeneration Mother    Cancer Father        skin   Macular degeneration Sister    Diabetes Brother    Colon cancer Neg Hx    Stomach cancer Neg Hx    Esophageal cancer Neg Hx    Rectal cancer Neg Hx     Social History Social History   Tobacco Use   Smoking status: Never   Smokeless tobacco: Former    Types: Chew   Tobacco comments:    Quit age 75 y.o  Vaping Use  Vaping Use: Never used  Substance Use Topics   Alcohol use: No   Drug use: No     Allergies   Amlodipine, Codeine, Ivp dye [iodinated contrast media], Penicillins, and Sulfa antibiotics   Review of Systems Review of Systems  Gastrointestinal:  Positive for abdominal pain.  All other systems reviewed and are negative.    Physical Exam Triage Vital Signs ED Triage Vitals  Enc Vitals Group     BP 02/12/22 1528 132/86     Pulse Rate 02/12/22 1528 87     Resp 02/12/22 1528 14     Temp 02/12/22 1528 98.3 F (36.8 C)     Temp Source 02/12/22 1528 Oral     SpO2 02/12/22 1528 96 %     Weight --      Height --      Head Circumference --      Peak Flow --      Pain Score 02/12/22 1530 7     Pain Loc --      Pain Edu? --      Excl. in [place]? --    No data found.  Updated Vital Signs BP 132/86 (BP Location: Right Arm)   Pulse 87   Temp 98.3 F (36.8 C) (Oral)   Resp 14   SpO2 96%      Physical Exam Vitals and nursing note reviewed.  Constitutional:      General: He is not in acute distress.    Appearance: He is obese. He is ill-appearing.  HENT:     Head: Normocephalic and atraumatic.     Mouth/Throat:     Mouth: Mucous membranes are moist.     Pharynx: Oropharynx is clear.  Eyes:     Extraocular Movements: Extraocular movements intact.     Pupils: Pupils are equal, round, and reactive to light.  Cardiovascular:     Rate and Rhythm: Normal rate and regular rhythm.     Heart sounds: Normal heart sounds.  Pulmonary:     Effort: Pulmonary effort is normal.      Breath sounds: Normal breath sounds. No wheezing, rhonchi or rales.  Abdominal:     General: Abdomen is flat. Bowel sounds are increased. There is no distension.     Palpations: Abdomen is soft. There is no shifting dullness, fluid wave, hepatomegaly, splenomegaly, mass or pulsatile mass.     Tenderness: There is abdominal tenderness in the suprapubic area and left lower quadrant. There is no right CVA tenderness, left CVA tenderness, guarding or rebound. Negative signs include Murphy's sign, Rovsing's sign and McBurney's sign.     Hernia: No hernia is present.  Skin:    General: Skin is warm and dry.  Neurological:     General: No focal deficit present.     Mental Status: He is alert. He is disoriented.      UC Treatments / Results  Labs (all labs ordered are listed, but only abnormal results are displayed) Labs Reviewed - No data to display  EKG   Radiology No results found.  Procedures Procedures (including critical care time)  Medications Ordered in UC Medications - No data to display  Initial Impression / Assessment and Plan / UC Course  I have reviewed the triage vital signs and the nursing notes.  Pertinent labs & imaging results that were available during my care of the patient were reviewed by me and considered in my medical decision making (see chart for details).  MDM: 1.  Lower abdominal pain-refilled Zofran and Carafate. Advised patient if lower abdominal pain worsens please go to nearest [facility] ED for further evaluation.  Advised patient as abdominal pain is mild now may return to our clinic tomorrow morning for CT of abdomen pelvis with contrast for further evaluation of ongoing lower abdominal pain.  Discussion with patient that CT study on 01/31/2022 was without contrast and with ongoing abdominal pain a contrasted study now should be performed.  Patient was insistent on receiving another Cipro prescription, advised risk of C. difficile was too  high with double quinolone prescription so advised patient we would not refill prescription today without further evaluation with CT of abdomen and pelvis with contrast. Final Clinical Impressions(s) / UC Diagnoses   Final diagnoses:  Lower abdominal pain     Discharge Instructions      Advised patient if lower abdominal pain worsens please go to nearest [facility] ED for further evaluation.  Advised patient as abdominal pain is mild now may return to our clinic tomorrow morning for CT of abdomen pelvis with contrast for further evaluation of ongoing lower abdominal pain.     ED Prescriptions     Medication Sig Dispense Auth. Provider   ondansetron (ZOFRAN-ODT) 4 MG disintegrating tablet Take 1 tablet (4 mg total) by mouth every 8 (eight) hours as needed for nausea or vomiting. 20 tablet Eliezer Lofts, FNP   sucralfate (CARAFATE) 1 g tablet Take 1 tablet (1 g total) by mouth 3 (three) times daily before meals. 30 tablet Eliezer Lofts, FNP      PDMP not reviewed this encounter.   Eliezer Lofts, [place] 02/12/22 [phone]

## 2022-02-12 NOTE — Discharge Instructions (Addendum)
Advised patient if lower abdominal pain worsens please go to nearest [facility] ED for further evaluation.  Advised patient as abdominal pain is mild now may return to our clinic tomorrow morning for CT of abdomen pelvis with contrast for further evaluation of ongoing lower abdominal pain.

## 2022-02-12 NOTE — Telephone Encounter (Signed)
See mychart message.  He was treated presumptively for diverticulitis at ER visit on November 30 with Cipro/Flagyl, however there was no clear evidence of diverticulitis on his CT scan at that time.  He was seen again December 7 at urgent care, treated with sucralfate and ondansetron for gastritis. If he is still having symptoms should be seen in office to determine next step.  Next few days. If any fevers, or worsening symptoms should be seen today, either in office, urgent care or ER. Thanks.

## 2022-02-13 ENCOUNTER — Encounter: Payer: Self-pay | Admitting: Emergency Medicine

## 2022-02-13 ENCOUNTER — Ambulatory Visit
Admission: EM | Admit: 2022-02-13 | Discharge: 2022-02-13 | Disposition: A | Discharge status: Home / Self Care | Payer: 59 | Attending: Family Medicine | Admitting: Family Medicine

## 2022-02-13 DIAGNOSIS — R1032 Left lower quadrant pain: Secondary | ICD-10-CM | POA: Diagnosis not present

## 2022-02-13 MED ORDER — AMOXICILLIN-POT CLAVULANATE 875-125 MG PO TABS: 1.0000 | ORAL_TABLET | Freq: Two times a day (BID) | ORAL | 0 refills | Status: DC

## 2022-02-13 NOTE — ED Provider Notes (Signed)
[facility]    CSN: [id number] Arrival date & time: 02/13/22  2355      History   Chief Complaint Chief Complaint  Patient presents with   Requesting CT Scan    HPI Shawn Ball. is a 60 y.o. male.   HPI  Patient has lower abdominal pain that he states has been present since Thanksgiving.  It is sometimes in the central low abdomen and sometimes in the left lower abdomen.  He has known diverticulosis.  It was found on colonoscopy and on CT scanning.  He has an episode of diverticulitis in the past.  For this episode of abdominal pain he has seen his primary care doctor, has had 3 urgent care visits now, and has had an emergency room visit.  He had a CT scan of his abdomen performed which failed to show diverticulitis.  His white blood count was normal.  CMP was normal.  He was treated with Cipro and metronidazole based on his symptoms.  He states that the Cipro helped but the metronidazole made him very sick to his stomach. The records from his prior visits are reviewed.  His recent CT scan and lab work is reviewed. Patient is here today requesting a CT scan.  He states he was told he needed an additional CT scan before he could have antibiotic treatment.  His abdominal pain is moderate.  States he has not had a bowel movement for 5 days.  Has not been drinking enough water, states that he does not have any appetite.  No fever or chills.  No loose bowels. Past Medical History:  Diagnosis Date   Allergy    Arthritis    Chronic bronchitis    Chronic pain    per pt/ right shoulder pain, left leg and ankle   CKD (chronic kidney disease) stage 3, GFR 30-59 ml/min (HCC)    Costochondral chest pain    due to torn tendons.   CTS (carpal tunnel syndrome)    right hand   Diabetes mellitus without complication (HCC)    prediabetes/ no meds   Diverticulitis    GERD (gastroesophageal reflux disease)    History of anal fissures    Hyperlipemia    Hypertension     Rectal bleeding    occasional   Seasonal allergies     Patient Active Problem List   Diagnosis Date Noted   Acute viral conjunctivitis of right eye 08/28/2020   Routine general medical examination at a health care facility 05/01/2020   Hyperlipidemia LDL goal <70 03/15/2020   Hypertriglyceridemia 03/15/2020   Diabetes mellitus without complication (HCC)    Benign essential HTN 08/25/2019    Past Surgical History:  Procedure Laterality Date   ANKLE FRACTURE SURGERY  2005 / 2007   left ankle and  left leg/ have screws in leg and ankle   CARPAL TUNNEL RELEASE Left 07/29/2019   Procedure: CARPAL TUNNEL RELEASE;  Surgeon: Cindee Salt, MD;  Location: [facility];  Service: Orthopedics;  Laterality: Left;  FOREARM BLOCK   EXAMINATION UNDER ANESTHESIA N/A 12/15/2012   Procedure: EXAM UNDER ANESTHESIA;  Surgeon: Atilano Ina, MD;  Location: [facility];  Service: General;  Laterality: N/A;   FRACTURE SURGERY  2005 and 2007   lt ankle x2   HEMORRHOIDECTOMY WITH HEMORRHOID BANDING N/A 12/15/2012   Procedure: EXCISIONAL HEMORRHOIDECTOMY WITH HEMORRHOID BANDING;  Surgeon: Atilano Ina, MD;  Location: [facility];  Service: General;  Laterality: N/A;   rotator cuff surgery      x 2/ right shoulder   TRIGGER FINGER RELEASE  07/23/2011   Procedure: Right hand- RELEASE TRIGGER FINGER/A-1 PULLEY;  Surgeon: Wyn Forster., MD;  Location: [facility];  Service: Orthopedics;  Laterality: Right;   WRIST FRACTURE SURGERY  1984   lt with bone graft   WRIST SURGERY     left wrist       Home Medications    Prior to Admission medications   Medication Sig Start Date End Date Taking? Authorizing Provider  amoxicillin-clavulanate (AUGMENTIN) 875-125 MG tablet Take 1 tablet by mouth every 12 (twelve) hours. 02/13/22  Yes Eustace Moore, MD  ASPIRIN 81 PO Take 1 tablet by mouth daily.   Yes [provider]  Biotin 5000 MCG CAPS 2  times a week 12/03/14  Yes [provider]  carvedilol (COREG) 12.5 MG tablet    Yes [provider]  carvedilol (COREG) 6.25 MG tablet Take 1 tablet (6.25 mg total) by mouth 2 (two) times daily with a meal. 01/21/22  Yes Shade Flood, MD  cetirizine (ZYRTEC) 10 MG tablet Take 1 tablet (10 mg total) by mouth daily. 10/28/19  Yes Donita Brooks, MD  Cetirizine HCl (ZYRTEC ALLERGY) 10 MG CAPS Take by oral route.   Yes [provider]  Collagen Hydrolysate, Bovine, POWD by Does not apply route. Takes Tour manager" for arthritis   Yes [provider]  FIBER COMPLETE PO Take by mouth. Vita Fusion fiber gummies   Yes [provider]  fluticasone (FLONASE) 50 MCG/ACT nasal spray Place 2 sprays into both nostrils daily. 04/27/18  Yes Donita Brooks, MD  Garlic Oil 1000 MG CAPS  10/02/20  Yes [provider]  Glucosamine-Chondroitin-MSM 500-200-150 MG TABS Take by mouth.   Yes [provider]  glucose blood (ONETOUCH VERIO) test strip CHECK BLOOD SUGARS IN THE MORNING AND AT BEDTIME. 07/19/21  Yes Shade Flood, MD  L-Lysine 1000 MG TABS Take by mouth.   Yes [provider]  L-Lysine 1000 MG TABS  03/04/12  Yes [provider]  Lancets (FREESTYLE) lancets Use as instructed 10/28/19  Yes Donita Brooks, MD  linagliptin (TRADJENTA) 5 MG TABS tablet Take 1 tablet (5 mg total) by mouth daily. 01/21/22  Yes Shade Flood, MD  losartan (COZAAR) 100 MG tablet Take 1 tablet (100 mg total) by mouth daily. 01/21/22  Yes Shade Flood, MD  losartan (COZAAR) 100 MG tablet Take 1 tablet by mouth daily.   Yes [provider]  MAGNESIUM OXIDE PO Take 375 mg by mouth daily.   Yes [provider]  mometasone (ELOCON) 0.1 % ointment Apply topically daily.   Yes [provider]  mometasone-formoterol (DULERA) 100-5 MCG/ACT AERO Inhale 2 puffs into the lungs 2 (two) times daily.   Yes [provider]   Multiple Vitamins-Minerals ([employer] FORMULA EYE HEALTH PO)  06/02/17  Yes [provider]  omega-3 acid ethyl esters (LOVAZA) 1 g capsule Take 2 capsules (2 g total) by mouth 2 (two) times daily. 08/29/21  Yes Shade Flood, MD  omeprazole (PRILOSEC) 20 MG capsule Take 1 capsule (20 mg total) by mouth 2 (two) times daily before a meal. 08/29/21  Yes Shade Flood, MD  ondansetron (ZOFRAN-ODT) 4 MG disintegrating tablet Take 1 tablet (4 mg total) by mouth every 8 (eight) hours as needed for nausea or vomiting. 02/12/22  Yes Ragan,  Casimiro Needle, FNP  simvastatin (ZOCOR) 20 MG tablet Take 1 tablet (20 mg total) by mouth at bedtime. 01/21/22  Yes Shade Flood, MD  sucralfate (CARAFATE) 1 g tablet Take 1 tablet (1 g total) by mouth 3 (three) times daily before meals. 02/12/22  Yes Trevor Iha, FNP    Family History Family History  Problem Relation Age of Onset   Heart disease Mother    Hyperlipidemia Mother    Diabetes Mother    Macular degeneration Mother    Cancer Father        skin   Macular degeneration Sister    Diabetes Brother    Colon cancer Neg Hx    Stomach cancer Neg Hx    Esophageal cancer Neg Hx    Rectal cancer Neg Hx     Social History Social History   Tobacco Use   Smoking status: Never   Smokeless tobacco: Former    Types: Chew   Tobacco comments:    Quit age 35 y.o  Vaping Use   Vaping Use: Never used  Substance Use Topics   Alcohol use: No   Drug use: No     Allergies   Amlodipine, Codeine, Ivp dye [iodinated contrast media], Penicillins, and Sulfa antibiotics   Review of Systems Review of Systems See HPI  Physical Exam Triage Vital Signs ED Triage Vitals  Enc Vitals Group     BP 02/13/22 0837 (!) 131/90     Pulse Rate 02/13/22 0837 70     Resp 02/13/22 0837 18     Temp 02/13/22 0837 98.4 F (36.9 C)     Temp Source 02/13/22 0837 Oral     SpO2 02/13/22 0837 95 %     Weight 02/13/22 0839 215 lb 13.3 oz (97.9 kg)     Height  02/13/22 0839 5\' 11"  (1.803 m)     Head Circumference --      Peak Flow --      Pain Score 02/13/22 0914 4     Pain Loc --      Pain Edu? --      Excl. in [place]? --    No data found.  Updated Vital Signs BP (!) 131/90 (BP Location: Right Arm)   Pulse 70   Temp 98.4 F (36.9 C) (Oral)   Resp 18   Ht 5\' 11"  (1.803 m)   Wt 97.9 kg   SpO2 95%   BMI 30.10 kg/m       Physical Exam Constitutional:      General: He is not in acute distress.    Appearance: Normal appearance. He is well-developed. He is not ill-appearing.  HENT:     Head: Normocephalic and atraumatic.  Eyes:     Conjunctiva/sclera: Conjunctivae normal.     Pupils: Pupils are equal, round, and reactive to light.  Cardiovascular:     Rate and Rhythm: Normal rate.  Pulmonary:     Effort: Pulmonary effort is normal. No respiratory distress.  Abdominal:     General: Abdomen is flat. Bowel sounds are normal. There is no distension.     Palpations: Abdomen is soft. There is no mass.     Tenderness: There is no abdominal tenderness. There is no guarding or rebound.     Comments: No mass.  No organomegaly.  No tenderness to deep palpation of the lower abdomen  Musculoskeletal:        General: Normal range of motion.     Cervical back: Normal range  of motion.  Skin:    General: Skin is warm and dry.  Neurological:     Mental Status: He is alert.     Gait: Gait normal.      UC Treatments / Results  Labs (all labs ordered are listed, but only abnormal results are displayed) Labs Reviewed - No data to display  EKG   Radiology No results found.  Procedures Procedures (including critical care time)  Medications Ordered in UC Medications - No data to display  Initial Impression / Assessment and Plan / UC Course  I have reviewed the triage vital signs and the nursing notes.  Pertinent labs & imaging results that were available during my care of the patient were reviewed by me and considered in my medical  decision making (see chart for details).     I believe a course of Augmentin is safer than a CT Scan with contrast.  If she continues to have abdominal pain he may need to see his gastroenterologist again.  I told him I would give him the Augmentin, and if this resolves follow-up is not needed.  If he continues to have abdominal pain he needs to make an appointment with his PCP for further workup.  I reminded him to drink more water.  I reminded him to continue with a high-fiber diet, and fiber supplements. Final Clinical Impressions(s) / UC Diagnoses   Final diagnoses:  Abdominal pain, left lower quadrant     Discharge Instructions      Continue the zofran for nausea Drink more water Continue the fiber Take antibiotic 2 x a day for a week Call for problems    ED Prescriptions     Medication Sig Dispense Auth. Provider   amoxicillin-clavulanate (AUGMENTIN) 875-125 MG tablet Take 1 tablet by mouth every 12 (twelve) hours. 14 tablet Eustace MooreNelson, Cybil [place] Sue, MD      PDMP not reviewed this encounter.   Eustace MooreNelson, Devarius Nelles Sue, MD 02/13/22 [phone]

## 2022-02-13 NOTE — Discharge Instructions (Addendum)
Continue the zofran for nausea Drink more water Continue the fiber Take antibiotic 2 x a day for a week Call for problems

## 2022-02-13 NOTE — ED Triage Notes (Signed)
Patient here for a f/u from his visit on yesterday.  Patient c/o abdominal pain since thanksgiving.  Patient requesting a CT scan with contrast.

## 2022-02-14 ENCOUNTER — Ambulatory Visit: Payer: 59 | Admitting: Family Medicine

## 2022-03-12 DIAGNOSIS — S52501A Unspecified fracture of the lower end of right radius, initial encounter for closed fracture: Secondary | ICD-10-CM | POA: Insufficient documentation

## 2022-03-12 DIAGNOSIS — M25531 Pain in right wrist: Secondary | ICD-10-CM | POA: Insufficient documentation

## 2022-03-12 DIAGNOSIS — M654 Radial styloid tenosynovitis [de Quervain]: Secondary | ICD-10-CM | POA: Insufficient documentation

## 2022-05-02 ENCOUNTER — Other Ambulatory Visit: Payer: Self-pay | Admitting: Family Medicine

## 2022-05-02 DIAGNOSIS — E1122 Type 2 diabetes mellitus with diabetic chronic kidney disease: Secondary | ICD-10-CM

## 2022-05-08 ENCOUNTER — Encounter: Payer: Self-pay | Admitting: Family Medicine

## 2022-05-08 ENCOUNTER — Ambulatory Visit (INDEPENDENT_AMBULATORY_CARE_PROVIDER_SITE_OTHER): Payer: 59 | Admitting: Family Medicine

## 2022-05-08 VITALS — BP 102/68 | HR 97 | Temp 97.4°F | Ht 71.0 in | Wt 216.6 lb

## 2022-05-08 DIAGNOSIS — E781 Pure hyperglyceridemia: Secondary | ICD-10-CM | POA: Diagnosis not present

## 2022-05-08 DIAGNOSIS — Z125 Encounter for screening for malignant neoplasm of prostate: Secondary | ICD-10-CM

## 2022-05-08 DIAGNOSIS — Z0000 Encounter for general adult medical examination without abnormal findings: Secondary | ICD-10-CM | POA: Diagnosis not present

## 2022-05-08 DIAGNOSIS — E1122 Type 2 diabetes mellitus with diabetic chronic kidney disease: Secondary | ICD-10-CM

## 2022-05-08 DIAGNOSIS — R051 Acute cough: Secondary | ICD-10-CM

## 2022-05-08 DIAGNOSIS — I10 Essential (primary) hypertension: Secondary | ICD-10-CM

## 2022-05-08 DIAGNOSIS — Z130 Encounter for screening for diseases of the blood and blood-forming organs and certain disorders involving the immune mechanism: Secondary | ICD-10-CM

## 2022-05-08 DIAGNOSIS — G4700 Insomnia, unspecified: Secondary | ICD-10-CM

## 2022-05-08 DIAGNOSIS — N183 Chronic kidney disease, stage 3 unspecified: Secondary | ICD-10-CM | POA: Diagnosis not present

## 2022-05-08 NOTE — Patient Instructions (Addendum)
I recommend not using the nasal decongestant more than 3-4 days in a row.  Follow up in next month to review labs and discuss sleep issues.  Return to the clinic or go to the nearest emergency room if any of your symptoms worsen or new symptoms occur.   Insomnia Insomnia is a sleep disorder that makes it difficult to fall asleep or stay asleep. Insomnia can cause fatigue, low energy, difficulty concentrating, mood swings, and poor performance at work or school. There are three different ways to classify insomnia: Difficulty falling asleep. Difficulty staying asleep. Waking up too early in the morning. Any type of insomnia can be long-term (chronic) or short-term (acute). Both are common. Short-term insomnia usually lasts for 3 months or less. Chronic insomnia occurs at least three times a week for longer than 3 months. What are the causes? Insomnia may be caused by another condition, situation, or substance, such as: Having certain mental health conditions, such as anxiety and depression. Using caffeine, alcohol, tobacco, or drugs. Having gastrointestinal conditions, such as gastroesophageal reflux disease (GERD). Having certain medical conditions. These include: Asthma. Alzheimer's disease. Stroke. Chronic pain. An overactive thyroid gland (hyperthyroidism). Other sleep disorders, such as restless legs syndrome and sleep apnea. Menopause. Sometimes, the cause of insomnia may not be known. What increases the risk? Risk factors for insomnia include: Gender. Females are affected more often than males. Age. Insomnia is more common as people get older. Stress and certain medical and mental health conditions. Lack of exercise. Having an irregular work schedule. This may include working night shifts and traveling between different time zones. What are the signs or symptoms? If you have insomnia, the main symptom is having trouble falling asleep or having trouble staying asleep. This may  lead to other symptoms, such as: Feeling tired or having low energy. Feeling nervous about going to sleep. Not feeling rested in the morning. Having trouble concentrating. Feeling irritable, anxious, or depressed. How is this diagnosed? This condition may be diagnosed based on: Your symptoms and medical history. Your health care provider may ask about: Your sleep habits. Any medical conditions you have. Your mental health. A physical exam. How is this treated? Treatment for insomnia depends on the cause. Treatment may focus on treating an underlying condition that is causing the insomnia. Treatment may also include: Medicines to help you sleep. Counseling or therapy. Lifestyle adjustments to help you sleep better. Follow these instructions at home: Eating and drinking  Limit or avoid alcohol, caffeinated beverages, and products that contain nicotine and tobacco, especially close to bedtime. These can disrupt your sleep. Do not eat a large meal or eat spicy foods right before bedtime. This can lead to digestive discomfort that can make it hard for you to sleep. Sleep habits  Keep a sleep diary to help you and your health care provider figure out what could be causing your insomnia. Write down: When you sleep. When you wake up during the night. How well you sleep and how rested you feel the next day. Any side effects of medicines you are taking. What you eat and drink. Make your bedroom a dark, comfortable place where it is easy to fall asleep. Put up shades or blackout curtains to block light from outside. Use a white noise machine to block noise. Keep the temperature cool. Limit screen use before bedtime. This includes: Not watching TV. Not using your smartphone, tablet, or computer. Stick to a routine that includes going to bed and waking up at the  same times every day and night. This can help you fall asleep faster. Consider making a quiet activity, such as reading, part of  your nighttime routine. Try to avoid taking naps during the day so that you sleep better at night. Get out of bed if you are still awake after 15 minutes of trying to sleep. Keep the lights down, but try reading or doing a quiet activity. When you feel sleepy, go back to bed. General instructions Take over-the-counter and prescription medicines only as told by your health care provider. Exercise regularly as told by your health care provider. However, avoid exercising in the hours right before bedtime. Use relaxation techniques to manage stress. Ask your health care provider to suggest some techniques that may work well for you. These may include: Breathing exercises. Routines to release muscle tension. Visualizing peaceful scenes. Make sure that you drive carefully. Do not drive if you feel very sleepy. Keep all follow-up visits. This is important. Contact a health care provider if: You are tired throughout the day. You have trouble in your daily routine due to sleepiness. You continue to have sleep problems, or your sleep problems get worse. Get help right away if: You have thoughts about hurting yourself or someone else. Get help right away if you feel like you may hurt yourself or others, or have thoughts about taking your own life. Go to your nearest emergency room or: Call 911. Call the [facility] at (320)542-7380 or 988. This is open 24 hours a day. Text the Crisis Text Line at (937) 275-3105. Summary Insomnia is a sleep disorder that makes it difficult to fall asleep or stay asleep. Insomnia can be long-term (chronic) or short-term (acute). Treatment for insomnia depends on the cause. Treatment may focus on treating an underlying condition that is causing the insomnia. Keep a sleep diary to help you and your health care provider figure out what could be causing your insomnia. This information is not intended to replace advice given to you by your health care  provider. Make sure you discuss any questions you have with your health care provider. Document Revised: 01/29/2021 Document Reviewed: 01/29/2021 [employer] Patient Education  [place].

## 2022-05-08 NOTE — Progress Notes (Unsigned)
Subjective:  Patient ID: Shawn Ball., male    DOB: [birth year]  Age: 62 y.o. MRN: [id number]  CC:  Chief Complaint  Patient presents with   Annual Exam    Pt is fasting since 11     HPI Shawn Ball. presents for Annual Exam  PCP, [facility], Dr. Amedeo Plenty, right radial tenosynovitis  Allergic rhinitis: Started 4 days ago. More nasal congestion, pnd, some cough. No bodyache/headache. No sick contacts. No f/c. Otherwise feels well.  Started nasacort for possible allergies, still having some nasal congestion - using otc nasal decongestant since yesterday.    Diabetes: With hyperglycemia, microalbuminuria, CKD.  Diet/herbal approach previously been treated with Tradjenta starting last year.  He is on statin, ACE inhibitor. Home readings. 120-130 in am Postprandial 150-160, up to 253 once only  No sx lows.  Microalbumin: Ratio 37.7, microalbumin 52.5 on 05/04/2021 Optho, foot exam, pneumovax:  Up to date.  Lab Results  Component Value Date   HGBA1C 6.7 (H) 01/21/2022   HGBA1C 6.4 (A) 08/29/2021   HGBA1C 6.7 (H) 05/04/2021   Lab Results  Component Value Date   MICROALBUR 52.5 (H) 05/04/2021   LDLCALC 84 01/21/2022   CREATININE 1.14 01/31/2022    Hypertension: Carvedilol 6.25 mg twice daily.  Losartan 100 mg daily.  Also treated with collagen supplement. Home readings: 119/82 range.  BP Readings from Last 3 Encounters:  05/08/22 102/68  02/13/22 (!) 131/90  02/12/22 132/86   Lab Results  Component Value Date   CREATININE 1.14 01/31/2022   Hyperlipidemia: Simvastatin 20 mg daily, Lovaza 2 capsules twice daily.  Also on lipid muscle supplement.  No new myalgias or side effects with current regimen. Lab Results  Component Value Date   CHOL 144 01/21/2022   HDL 34.20 (L) 01/21/2022   LDLCALC 84 01/21/2022   LDLDIRECT 75.0 06/25/2021   TRIG 129.0 01/21/2022   CHOLHDL 4 01/21/2022   Lab Results  Component Value Date   ALT 18 01/31/2022   AST 13  (L) 01/31/2022   ALKPHOS 77 01/31/2022   BILITOT 0.6 01/31/2022   GERD, probiotic and omeprazole 1 to 2/day has been stable treatment.  Sleep issues - goes pt bed late, nighttime wakening for years. No recent changes. Attributes to covid vaccines.     05/08/2022    2:15 PM 01/31/2022    3:34 PM 01/21/2022    9:31 AM 08/29/2021    8:17 AM 06/25/2021    1:12 PM  Depression screen PHQ 2/9  Decreased Interest 0 0 0 0 0  Down, Depressed, Hopeless 0 0 0 0 0  PHQ - 2 Score 0 0 0 0 0  Altered sleeping 3 0 0  1  Tired, decreased energy 0 0 0  0  Change in appetite 0 0 0  0  Feeling bad or failure about yourself  0 0 0  0  Trouble concentrating 0 0 0  0  Moving slowly or fidgety/restless 0 0 0  0  Suicidal thoughts 0 0 0  0  PHQ-9 Score 3 0 0  1    Health Maintenance  Topic Date Due   Diabetic kidney evaluation - Urine ACR  05/05/2022   COVID-19 Vaccine (4 - 2023-24 season) 05/24/2022 (Originally 11/02/2021)   OPHTHALMOLOGY EXAM  07/17/2022   HEMOGLOBIN A1C  07/22/2022   FOOT EXAM  01/22/2023   Diabetic kidney evaluation - eGFR measurement  02/01/2023   COLONOSCOPY (Pts 45-29yr Insurance coverage will need to  be confirmed)  06/12/2027   DTaP/Tdap/Td (3 - Td or Tdap) 12/11/2031   INFLUENZA VACCINE  Completed   Hepatitis C Screening  Completed   HIV Screening  Completed   Zoster Vaccines- Shingrix  Completed   HPV VACCINES  Aged Out  Colonoscopy: 2019. Diverticulitis, repeat 10 years.  Prostate: does not have family history of prostate cancer The natural history of prostate cancer and ongoing controversy regarding screening and potential treatment outcomes of prostate cancer has been discussed with the patient. The meaning of a false positive PSA and a false negative PSA has been discussed. He indicates understanding of the limitations of this screening test and wishes to proceed with screening PSA testing. Lab Results  Component Value Date   PSA 0.32 05/04/2021   PSA 0.33 05/01/2020    PSA 0.4 04/29/2019   Immunization History  Administered Date(s) Administered   Influenza Inj Mdck Quad Pf 11/24/2019   Influenza,inj,Quad PF,6+ Mos 04/20/2018, 11/03/2018, 12/29/2019, 12/22/2020   Influenza-Unspecified 12/10/2021   Moderna Sars-Covid-2 Vaccination 05/11/2019, 06/08/2019, 07/24/2020   Pneumococcal Conjugate-13 03/15/2020   Pneumococcal Polysaccharide-23 04/14/2017   Tdap 03/15/2020   Tetanus 12/10/2021   Zoster Recombinat (Shingrix) 05/22/2017, 09/09/2017  Declines further covid vaccines.  Flu vaccine 12/10/21.  RSV vaccine - declines.   No results found. Optho - appt on 5/24.   Dental: due - has not yet scheduled - recommended.   Alcohol: none  Tobacco: none  Exercise: less recently, plans on increasing.  On protein/liquid diet at times when blood sugar is high, and other regular meals.  Wt Readings from Last 3 Encounters:  05/08/22 216 lb 9.6 oz (98.2 kg)  02/13/22 215 lb 13.3 oz (97.9 kg)  01/31/22 215 lb 13.3 oz (97.9 kg)      History Patient Active Problem List   Diagnosis Date Noted   Acute viral conjunctivitis of right eye 08/28/2020   Routine general medical examination at a health care facility 05/01/2020   Hyperlipidemia LDL goal <70 03/15/2020   Hypertriglyceridemia 03/15/2020   Diabetes mellitus without complication ([place])    Benign essential HTN 08/25/2019   Past Medical History:  Diagnosis Date   Allergy    Arthritis    Chronic bronchitis    Chronic pain    per pt/ right shoulder pain, left leg and ankle   CKD (chronic kidney disease) stage 3, GFR 30-59 ml/min ([facility])    Costochondral chest pain    due to torn tendons.   CTS (carpal tunnel syndrome)    right hand   Diabetes mellitus without complication ([place])    prediabetes/ no meds   Diverticulitis    GERD (gastroesophageal reflux disease)    History of anal fissures    Hyperlipemia    Hypertension    Rectal bleeding    occasional   Seasonal allergies    Past Surgical  History:  Procedure Laterality Date   ANKLE FRACTURE SURGERY  2005 / 2007   left ankle and  left leg/ have screws in leg and ankle   CARPAL TUNNEL RELEASE Left 07/29/2019   Procedure: CARPAL TUNNEL RELEASE;  Surgeon: Daryll Brod, MD;  Location: [facility];  Service: Orthopedics;  Laterality: Left;  FOREARM BLOCK   EXAMINATION UNDER ANESTHESIA N/A 12/15/2012   Procedure: EXAM UNDER ANESTHESIA;  Surgeon: Gayland Curry, MD;  Location: [facility];  Service: General;  Laterality: N/A;   FRACTURE SURGERY  2005 and 2007   lt ankle x2   HEMORRHOIDECTOMY WITH HEMORRHOID  BANDING N/A 12/15/2012   Procedure: EXCISIONAL HEMORRHOIDECTOMY WITH HEMORRHOID BANDING;  Surgeon: Gayland Curry, MD;  Location: [facility];  Service: General;  Laterality: N/A;   rotator cuff surgery      x 2/ right shoulder   TRIGGER FINGER RELEASE  07/23/2011   Procedure: Right hand- RELEASE TRIGGER FINGER/A-1 PULLEY;  Surgeon: Cammie Sickle., MD;  Location: [facility];  Service: Orthopedics;  Laterality: Right;   WRIST FRACTURE SURGERY  1984   lt with bone graft   WRIST SURGERY     left wrist   Allergies  Allergen Reactions   Amlodipine Other (See Comments)    Per pt - damage to kidney's   Codeine Nausea And Vomiting   Ivp Dye [Iodinated Contrast Media] Nausea Only    Nauseated   Penicillins Nausea And Vomiting    As a child   Sulfa Antibiotics Nausea Only    nauseated   Prior to Admission medications   Medication Sig Start Date End Date Taking? Authorizing Provider  amoxicillin-clavulanate (AUGMENTIN) 875-125 MG tablet Take 1 tablet by mouth every 12 (twelve) hours. 02/13/22   Raylene Everts, MD  ASPIRIN 81 PO Take 1 tablet by mouth daily.    [provider]  Biotin 5000 MCG CAPS 2 times a week 12/03/14   [provider]  carvedilol (COREG) 12.5 MG tablet     [provider]  carvedilol (COREG) 6.25 MG tablet Take 1 tablet  (6.25 mg total) by mouth 2 (two) times daily with a meal. 01/21/22   Wendie Agreste, MD  cetirizine (ZYRTEC) 10 MG tablet Take 1 tablet (10 mg total) by mouth daily. 10/28/19   Susy Frizzle, MD  Cetirizine HCl (ZYRTEC ALLERGY) 10 MG CAPS Take by oral route.    [provider]  Collagen Hydrolysate, Bovine, POWD by Does not apply route. Takes Psychologist, counselling" for arthritis    [provider]  FIBER COMPLETE PO Take by mouth. Vita Fusion fiber gummies    [provider]  fluticasone (FLONASE) 50 MCG/ACT nasal spray Place 2 sprays into both nostrils daily. 04/27/18   Susy Frizzle, MD  Garlic Oil 123XX123 MG CAPS  10/02/20   [provider]  Glucosamine-Chondroitin-MSM 500-200-150 MG TABS Take by mouth.    [provider]  L-Lysine 1000 MG TABS Take by mouth.    [provider]  L-Lysine 1000 MG TABS  03/04/12   [provider]  Lancets (FREESTYLE) lancets Use as instructed 10/28/19   Susy Frizzle, MD  linagliptin (TRADJENTA) 5 MG TABS tablet Take 1 tablet (5 mg total) by mouth daily. 01/21/22   Wendie Agreste, MD  losartan (COZAAR) 100 MG tablet Take 1 tablet (100 mg total) by mouth daily. 01/21/22   Wendie Agreste, MD  losartan (COZAAR) 100 MG tablet Take 1 tablet by mouth daily.    [provider]  MAGNESIUM OXIDE PO Take 375 mg by mouth daily.    [provider]  mometasone (ELOCON) 0.1 % ointment Apply topically daily.    [provider]  mometasone-formoterol (DULERA) 100-5 MCG/ACT AERO Inhale 2 puffs into the lungs 2 (two) times daily.    [provider]  Multiple Vitamins-Minerals ([place])  06/02/17   [provider]  omega-3 acid ethyl esters (LOVAZA) 1 g capsule Take 2 capsules (2 g total) by mouth 2 (two) times daily. 08/29/21   Wendie Agreste, MD  omeprazole (PRILOSEC) 20 MG capsule Take 1 capsule (20 mg total) by mouth 2 (two) times daily before a meal.  08/29/21   Wendie Agreste, MD  ondansetron (ZOFRAN-ODT) 4 MG disintegrating tablet Take 1 tablet (4 mg total) by mouth every 8 (eight) hours as needed for nausea or vomiting. 02/12/22   Eliezer Lofts, FNP  ONETOUCH VERIO test strip CHECK BLOOD SUGARS IN THE MORNING AND AT BEDTIME. 05/02/22   Wendie Agreste, MD  simvastatin (ZOCOR) 20 MG tablet Take 1 tablet (20 mg total) by mouth at bedtime. 01/21/22   Wendie Agreste, MD  sucralfate (CARAFATE) 1 g tablet Take 1 tablet (1 g total) by mouth 3 (three) times daily before meals. 02/12/22   Eliezer Lofts, FNP   Social History   Socioeconomic History   Marital status: Divorced    Spouse name: Not on file   Number of children: 1   Years of education: Not on file   Highest education level: Not on file  Occupational History   Occupation: disabled  Tobacco Use   Smoking status: Never   Smokeless tobacco: Former    Types: Chew   Tobacco comments:    Quit age 31 y.o  Vaping Use   Vaping Use: Never used  Substance and Sexual Activity   Alcohol use: No   Drug use: No   Sexual activity: Not Currently  Other Topics Concern   Not on file  Social History Narrative   Not on file   Social Determinants of Health   Financial Resource Strain: Not on file  Food Insecurity: Not on file  Transportation Needs: Not on file  Physical Activity: Not on file  Stress: Not on file  Social Connections: Not on file  Intimate Partner Violence: Not on file    Review of Systems  Constitutional:  Negative for fatigue and unexpected weight change.  Eyes:  Negative for visual disturbance.  Respiratory:  Negative for cough, chest tightness and shortness of breath.   Cardiovascular:  Negative for chest pain, palpitations and leg swelling.  Gastrointestinal:  Negative for abdominal pain and blood in stool.  Neurological:  Negative for dizziness, light-headedness and headaches.  13 point review of systems per patient health survey noted.  Negative other  than as indicated above or in HPI.     Objective:   Vitals:   05/08/22 1417  BP: 102/68  Pulse: 97  Temp: (!) 97.4 F (36.3 C)  TempSrc: Oral  SpO2: 98%  Weight: 216 lb 9.6 oz (98.2 kg)  Height: '5\' 11"'$  (1.803 m)     Physical Exam Vitals reviewed.  Constitutional:      Appearance: He is well-developed.  HENT:     Head: Normocephalic and atraumatic.     Right Ear: External ear normal.     Left Ear: External ear normal.     Nose:     Comments: Sinuses nontender.  Eyes:     Conjunctiva/sclera: Conjunctivae normal.     Pupils: Pupils are equal, round, and reactive to light.  Neck:     Thyroid: No thyromegaly.  Cardiovascular:     Rate and Rhythm: Normal rate and regular rhythm.     Heart sounds: Normal heart sounds.  Pulmonary:     Effort: Pulmonary effort is normal. No respiratory distress.     Breath sounds: Normal breath sounds. No wheezing.  Abdominal:     General: There is no distension.     Palpations: Abdomen is soft.  Tenderness: There is no abdominal tenderness.  Musculoskeletal:        General: No tenderness. Normal range of motion.     Cervical back: Normal range of motion and neck supple.  Lymphadenopathy:     Cervical: No cervical adenopathy.  Skin:    General: Skin is warm and dry.  Neurological:     Mental Status: He is alert and oriented to person, place, and time.     Deep Tendon Reflexes: Reflexes are normal and symmetric.  Psychiatric:        Behavior: Behavior normal.        Assessment & Plan:  Shawn Ball. is a 62 y.o. male . Annual physical exam  - -anticipatory guidance as below in AVS, screening labs above. Health maintenance items as above in HPI discussed/recommended as applicable.   Controlled type 2 diabetes mellitus with stage 3 chronic kidney disease, without long-term current use of insulin ([place]) - Plan: Comprehensive metabolic panel, Hemoglobin A1c, Urine Microalbumin w/creat. ratio  -Check A1c, labs above with  medication adjustments accordingly.  Tolerating Tradjenta at this time.  Screening for prostate cancer - Plan: PSA  Hypertriglyceridemia - Plan: Lipid panel  -Check lipids to decide on medication recommendations or changes.  Continue same regimen with simvastatin and Lovaza for now.  Screening for deficiency anemia - Plan: CBC with Differential/Platelet  Essential hypertension  -Stable with current regimen, no changes.  Okay to refill meds if needed, 59-monthfollow-up.  Acute cough Likely viral illness versus allergies., testing declined.  Over-the-counter nasal decongestant limitations discussed, no more than 3 to 4 days.  RTC precautions if not continuing to improve.  Insomnia, unspecified type Handout given on insomnia with sleep hygiene, RTC precautions if persistent  No orders of the defined types were placed in this encounter.  Patient Instructions  I recommend not using the nasal decongestant more than 3-4 days in a row.  Follow up in next month to review labs and discuss sleep issues.  Return to the clinic or go to the nearest emergency room if any of your symptoms worsen or new symptoms occur.   Insomnia Insomnia is a sleep disorder that makes it difficult to fall asleep or stay asleep. Insomnia can cause fatigue, low energy, difficulty concentrating, mood swings, and poor performance at work or school. There are three different ways to classify insomnia: Difficulty falling asleep. Difficulty staying asleep. Waking up too early in the morning. Any type of insomnia can be long-term (chronic) or short-term (acute). Both are common. Short-term insomnia usually lasts for 3 months or less. Chronic insomnia occurs at least three times a week for longer than 3 months. What are the causes? Insomnia may be caused by another condition, situation, or substance, such as: Having certain mental health conditions, such as anxiety and depression. Using caffeine, alcohol, tobacco, or  drugs. Having gastrointestinal conditions, such as gastroesophageal reflux disease (GERD). Having certain medical conditions. These include: Asthma. Alzheimer's disease. Stroke. Chronic pain. An overactive thyroid gland (hyperthyroidism). Other sleep disorders, such as restless legs syndrome and sleep apnea. Menopause. Sometimes, the cause of insomnia may not be known. What increases the risk? Risk factors for insomnia include: Gender. Females are affected more often than males. Age. Insomnia is more common as people get older. Stress and certain medical and mental health conditions. Lack of exercise. Having an irregular work schedule. This may include working night shifts and traveling between different time zones. What are the signs or symptoms? If you have  insomnia, the main symptom is having trouble falling asleep or having trouble staying asleep. This may lead to other symptoms, such as: Feeling tired or having low energy. Feeling nervous about going to sleep. Not feeling rested in the morning. Having trouble concentrating. Feeling irritable, anxious, or depressed. How is this diagnosed? This condition may be diagnosed based on: Your symptoms and medical history. Your health care provider may ask about: Your sleep habits. Any medical conditions you have. Your mental health. A physical exam. How is this treated? Treatment for insomnia depends on the cause. Treatment may focus on treating an underlying condition that is causing the insomnia. Treatment may also include: Medicines to help you sleep. Counseling or therapy. Lifestyle adjustments to help you sleep better. Follow these instructions at home: Eating and drinking  Limit or avoid alcohol, caffeinated beverages, and products that contain nicotine and tobacco, especially close to bedtime. These can disrupt your sleep. Do not eat a large meal or eat spicy foods right before bedtime. This can lead to digestive  discomfort that can make it hard for you to sleep. Sleep habits  Keep a sleep diary to help you and your health care provider figure out what could be causing your insomnia. Write down: When you sleep. When you wake up during the night. How well you sleep and how rested you feel the next day. Any side effects of medicines you are taking. What you eat and drink. Make your bedroom a dark, comfortable place where it is easy to fall asleep. Put up shades or blackout curtains to block light from outside. Use a white noise machine to block noise. Keep the temperature cool. Limit screen use before bedtime. This includes: Not watching TV. Not using your smartphone, tablet, or computer. Stick to a routine that includes going to bed and waking up at the same times every day and night. This can help you fall asleep faster. Consider making a quiet activity, such as reading, part of your nighttime routine. Try to avoid taking naps during the day so that you sleep better at night. Get out of bed if you are still awake after 15 minutes of trying to sleep. Keep the lights down, but try reading or doing a quiet activity. When you feel sleepy, go back to bed. General instructions Take over-the-counter and prescription medicines only as told by your health care provider. Exercise regularly as told by your health care provider. However, avoid exercising in the hours right before bedtime. Use relaxation techniques to manage stress. Ask your health care provider to suggest some techniques that may work well for you. These may include: Breathing exercises. Routines to release muscle tension. Visualizing peaceful scenes. Make sure that you drive carefully. Do not drive if you feel very sleepy. Keep all follow-up visits. This is important. Contact a health care provider if: You are tired throughout the day. You have trouble in your daily routine due to sleepiness. You continue to have sleep problems, or your  sleep problems get worse. Get help right away if: You have thoughts about hurting yourself or someone else. Get help right away if you feel like you may hurt yourself or others, or have thoughts about taking your own life. Go to your nearest emergency room or: Call 911. Call the [facility] at [phone] or 988. This is open 24 hours a day. Text the Crisis Text Line at 251-211-8134. Summary Insomnia is a sleep disorder that makes it difficult to fall asleep or stay  asleep. Insomnia can be long-term (chronic) or short-term (acute). Treatment for insomnia depends on the cause. Treatment may focus on treating an underlying condition that is causing the insomnia. Keep a sleep diary to help you and your health care provider figure out what could be causing your insomnia. This information is not intended to replace advice given to you by your health care provider. Make sure you discuss any questions you have with your health care provider. Document Revised: 01/29/2021 Document Reviewed: 01/29/2021 Elsevier Patient Education  2023 [place],   Merri Ray, MD [place], [facility] 05/08/22 3:25 PM

## 2022-05-09 ENCOUNTER — Encounter: Payer: Self-pay | Admitting: Family Medicine

## 2022-05-09 LAB — COMPREHENSIVE METABOLIC PANEL
ALT: 20 U/L (ref 0–53)
AST: 17 U/L (ref 0–37)
Albumin: 3.8 g/dL (ref 3.5–5.2)
Alkaline Phosphatase: 60 U/L (ref 39–117)
BUN: 17 mg/dL (ref 6–23)
CO2: 29 mEq/L (ref 19–32)
Calcium: 9.4 mg/dL (ref 8.4–10.5)
Chloride: 103 mEq/L (ref 96–112)
Creatinine, Ser: 1.24 mg/dL (ref 0.40–1.50)
GFR: 63.29 mL/min (ref >60.00)
Glucose, Bld: 142 mg/dL — ABNORMAL HIGH (ref 70–99)
Potassium: 4.5 mEq/L (ref 3.5–5.1)
Sodium: 140 mEq/L (ref 135–145)
Total Bilirubin: 0.6 mg/dL (ref 0.2–1.2)
Total Protein: 6.4 g/dL (ref 6.0–8.3)

## 2022-05-09 LAB — LIPID PANEL
Cholesterol: 130 mg/dL (ref 0–200)
HDL: 32.5 mg/dL — ABNORMAL LOW (ref >39.00)
LDL Cholesterol: 73 mg/dL (ref 0–99)
NonHDL: 97.21
Total CHOL/HDL Ratio: 4
Triglycerides: 119 mg/dL (ref 0.0–149.0)
VLDL: 23.8 mg/dL (ref 0.0–40.0)

## 2022-05-09 LAB — CBC WITH DIFFERENTIAL/PLATELET
Basophils Absolute: 0 10*3/uL (ref 0.0–0.1)
Basophils Relative: 0.5 % (ref 0.0–3.0)
Eosinophils Absolute: 0.1 10*3/uL (ref 0.0–0.7)
Eosinophils Relative: 1.9 % (ref 0.0–5.0)
HCT: 42.1 % (ref 39.0–52.0)
Hemoglobin: 14.5 g/dL (ref 13.0–17.0)
Lymphocytes Relative: 19 % (ref 12.0–46.0)
Lymphs Abs: 1.4 10*3/uL (ref 0.7–4.0)
MCHC: 34.5 g/dL (ref 30.0–36.0)
MCV: 85.3 fl (ref 78.0–100.0)
Monocytes Absolute: 0.8 10*3/uL (ref 0.1–1.0)
Monocytes Relative: 10.5 % (ref 3.0–12.0)
Neutro Abs: 5 10*3/uL (ref 1.4–7.7)
Neutrophils Relative %: 68.1 % (ref 43.0–77.0)
Platelets: 216 10*3/uL (ref 150.0–400.0)
RBC: 4.93 Mil/uL (ref 4.22–5.81)
RDW: 13.8 % (ref 11.5–15.5)
WBC: 7.4 10*3/uL (ref 4.0–10.5)

## 2022-05-09 LAB — MICROALBUMIN / CREATININE URINE RATIO
Creatinine,U: 90 mg/dL
Microalb Creat Ratio: 12.3 mg/g (ref 0.0–30.0)
Microalb, Ur: 11.1 mg/dL — ABNORMAL HIGH (ref 0.0–1.9)

## 2022-05-09 LAB — HEMOGLOBIN A1C: Hgb A1c MFr Bld: 6.4 % (ref 4.6–6.5)

## 2022-05-09 LAB — PSA: PSA: 0.34 ng/mL (ref 0.10–4.00)

## 2022-05-16 ENCOUNTER — Telehealth: Payer: Self-pay | Admitting: Family Medicine

## 2022-05-16 ENCOUNTER — Other Ambulatory Visit: Payer: Self-pay

## 2022-05-16 DIAGNOSIS — E1122 Type 2 diabetes mellitus with diabetic chronic kidney disease: Secondary | ICD-10-CM

## 2022-05-16 NOTE — Telephone Encounter (Signed)
I do not see in note where he is to test 3 times daily okay for me to adjust script to accommodate TID checks?

## 2022-05-16 NOTE — Telephone Encounter (Signed)
Although he is welcome to check more frequently, his diabetes was well-controlled and without being on insulin may not be covered by insurance to check it that frequently.  I am okay with once per day at various times, including fasting or 2 hours after meals.  He can vary the times of day over the course of the week to get a better idea of trends.  Let me know if there are questions.

## 2022-05-16 NOTE — Telephone Encounter (Signed)
Encourage patient to contact the pharmacy for refills or they can request refills through [facility]: [place] ([place]), [place] - [address]   MEDICATION NAME & DOSE:ONETOUCH VERIO test strip    NOTES/COMMENTS FROM PATIENT:2 packs of strips a month if his insurance covers because Dr Carlota Raspberry is asking to check his BS 3xdaily. Pt is asking did Dr Carlota Raspberry refill ALL scripts at the last physical.      [facility] office please notify patient: It takes 48-72 hours to process rx refill requests Ask patient to call pharmacy to ensure rx is ready before heading there.

## 2022-05-17 NOTE — Telephone Encounter (Signed)
Pt informed

## 2022-05-18 ENCOUNTER — Ambulatory Visit
Admission: EM | Admit: 2022-05-18 | Discharge: 2022-05-18 | Disposition: A | Discharge status: Home / Self Care | Payer: 59 | Attending: Nurse Practitioner | Admitting: Nurse Practitioner

## 2022-05-18 ENCOUNTER — Ambulatory Visit: Payer: Self-pay

## 2022-05-18 ENCOUNTER — Encounter: Payer: Self-pay | Admitting: Emergency Medicine

## 2022-05-18 DIAGNOSIS — B349 Viral infection, unspecified: Secondary | ICD-10-CM | POA: Diagnosis present

## 2022-05-18 DIAGNOSIS — Z8709 Personal history of other diseases of the respiratory system: Secondary | ICD-10-CM | POA: Insufficient documentation

## 2022-05-18 DIAGNOSIS — Z1152 Encounter for screening for COVID-19: Secondary | ICD-10-CM | POA: Insufficient documentation

## 2022-05-18 LAB — POCT RAPID STREP A (OFFICE): Rapid Strep A Screen: NEGATIVE

## 2022-05-18 LAB — POCT INFLUENZA A/B
Influenza A, POC: NEGATIVE
Influenza B, POC: NEGATIVE

## 2022-05-18 MED ORDER — ALUM & MAG HYDROXIDE-SIMETH 200-200-20 MG/5ML PO SUSP
30.0000 mL | Freq: Once | ORAL | Status: AC
  Administered 2022-05-18: 30 mL via ORAL

## 2022-05-18 MED ORDER — PREDNISONE 20 MG PO TABS: 40.0000 mg | ORAL_TABLET | Freq: Every day | ORAL | 0 refills | Status: AC

## 2022-05-18 MED ORDER — PSEUDOEPH-BROMPHEN-DM 30-2-10 MG/5ML PO SYRP: 5.0000 mL | ORAL_SOLUTION | Freq: Four times a day (QID) | ORAL | 0 refills | Status: DC | PRN

## 2022-05-18 NOTE — ED Provider Notes (Signed)
[facility]    CSN: [id number] Arrival date & time: 05/18/22  1111      History   Chief Complaint No chief complaint on file.   HPI Shawn Ball. is a 62 y.o. male.   The history is provided by the patient.   The patient presents for complaints of fever, nasal drainage, and sore throat.  Patient states fever started over the past 24 hours.  Tmax 103.  He states he took Advil to help control his fever.  He also states that he has had nasal drainage for the past 2 weeks.  He denies headache, ear pain, ear drainage, cough, abdominal pain, nausea, vomiting, or diarrhea.  Patient states that his grandson was recently diagnosed with strep throat, and he picked him up from school.  Reports history chronic seasonal allergies..  Patient also states that for his nasal drainage, he has been taking Nasacort.  He states that he did take Benadryl last evening as well.  Patient reports a history of diabetes.  States his last A1c was 6.4.  Past Medical History:  Diagnosis Date   Allergy    Arthritis    Chronic bronchitis    Chronic pain    per pt/ right shoulder pain, left leg and ankle   CKD (chronic kidney disease) stage 3, GFR 30-59 ml/min (HCC)    Costochondral chest pain    due to torn tendons.   CTS (carpal tunnel syndrome)    right hand   Diabetes mellitus without complication ([place])    prediabetes/ no meds   Diverticulitis    GERD (gastroesophageal reflux disease)    History of anal fissures    Hyperlipemia    Hypertension    Rectal bleeding    occasional   Seasonal allergies     Patient Active Problem List   Diagnosis Date Noted   Acute viral conjunctivitis of right eye 08/28/2020   Routine general medical examination at a health care facility 05/01/2020   Hyperlipidemia LDL goal <70 03/15/2020   Hypertriglyceridemia 03/15/2020   Diabetes mellitus without complication ([place])    Benign essential HTN 08/25/2019    Past Surgical History:  Procedure  Laterality Date   ANKLE FRACTURE SURGERY  2005 / 2007   left ankle and  left leg/ have screws in leg and ankle   CARPAL TUNNEL RELEASE Left 07/29/2019   Procedure: CARPAL TUNNEL RELEASE;  Surgeon: Daryll Brod, MD;  Location: [facility];  Service: Orthopedics;  Laterality: Left;  FOREARM BLOCK   EXAMINATION UNDER ANESTHESIA N/A 12/15/2012   Procedure: EXAM UNDER ANESTHESIA;  Surgeon: Gayland Curry, MD;  Location: [facility];  Service: General;  Laterality: N/A;   FRACTURE SURGERY  2005 and 2007   lt ankle x2   HEMORRHOIDECTOMY WITH HEMORRHOID BANDING N/A 12/15/2012   Procedure: EXCISIONAL HEMORRHOIDECTOMY WITH HEMORRHOID BANDING;  Surgeon: Gayland Curry, MD;  Location: [facility];  Service: General;  Laterality: N/A;   rotator cuff surgery      x 2/ right shoulder   TRIGGER FINGER RELEASE  07/23/2011   Procedure: Right hand- RELEASE TRIGGER FINGER/A-1 PULLEY;  Surgeon: Cammie Sickle., MD;  Location: [facility];  Service: Orthopedics;  Laterality: Right;   WRIST FRACTURE SURGERY  1984   lt with bone graft   WRIST SURGERY     left wrist       Home Medications    Prior to Admission medications  Medication Sig Start Date End Date Taking? Authorizing Provider  brompheniramine-pseudoephedrine-DM 30-2-10 MG/5ML syrup Take 5 mLs by mouth 4 (four) times daily as needed. 05/18/22  Yes Isaih Bulger-Warren, Alda Lea, NP  predniSONE (DELTASONE) 20 MG tablet Take 2 tablets (40 mg total) by mouth daily with breakfast for 5 days. 05/18/22 05/23/22 Yes Ivory Maduro-Warren, Alda Lea, NP  ASPIRIN 81 PO Take 1 tablet by mouth daily.    [provider]  Biotin 5000 MCG CAPS 2 times a week 12/03/14   [provider]  carvedilol (COREG) 12.5 MG tablet     [provider]  carvedilol (COREG) 6.25 MG tablet Take 1 tablet (6.25 mg total) by mouth 2 (two) times daily with a meal. 01/21/22   Wendie Agreste, MD  cetirizine (ZYRTEC)  10 MG tablet Take 1 tablet (10 mg total) by mouth daily. 10/28/19   Susy Frizzle, MD  Cetirizine HCl (ZYRTEC ALLERGY) 10 MG CAPS Take by oral route.    [provider]  Collagen Hydrolysate, Bovine, POWD by Does not apply route. Takes Psychologist, counselling" for arthritis    [provider]  FIBER COMPLETE PO Take by mouth. Vita Fusion fiber gummies    [provider]  fluticasone (FLONASE) 50 MCG/ACT nasal spray Place 2 sprays into both nostrils daily. 04/27/18   Susy Frizzle, MD  Garlic Oil 123XX123 MG CAPS  10/02/20   [provider]  Glucosamine-Chondroitin-MSM 500-200-150 MG TABS Take by mouth.    [provider]  L-Lysine 1000 MG TABS Take by mouth.    [provider]  L-Lysine 1000 MG TABS  03/04/12   [provider]  Lancets (FREESTYLE) lancets Use as instructed 10/28/19   Susy Frizzle, MD  linagliptin (TRADJENTA) 5 MG TABS tablet Take 1 tablet (5 mg total) by mouth daily. 01/21/22   Wendie Agreste, MD  losartan (COZAAR) 100 MG tablet Take 1 tablet (100 mg total) by mouth daily. 01/21/22   Wendie Agreste, MD  losartan (COZAAR) 100 MG tablet Take 1 tablet by mouth daily.    [provider]  MAGNESIUM OXIDE PO Take 375 mg by mouth daily.    [provider]  mometasone (ELOCON) 0.1 % ointment Apply topically daily.    [provider]  mometasone-formoterol (DULERA) 100-5 MCG/ACT AERO Inhale 2 puffs into the lungs 2 (two) times daily.    [provider]  Multiple Vitamins-Minerals ([place])  06/02/17   [provider]  omega-3 acid ethyl esters (LOVAZA) 1 g capsule Take 2 capsules (2 g total) by mouth 2 (two) times daily. 08/29/21   Wendie Agreste, MD  omeprazole (PRILOSEC) 20 MG capsule Take 1 capsule (20 mg total) by mouth 2 (two) times daily before a meal. 08/29/21   Wendie Agreste, MD  ondansetron (ZOFRAN-ODT) 4 MG disintegrating tablet Take 1 tablet (4 mg total) by  mouth every 8 (eight) hours as needed for nausea or vomiting. 02/12/22   Eliezer Lofts, FNP  ONETOUCH VERIO test strip CHECK BLOOD SUGARS IN THE MORNING AND AT BEDTIME. 05/02/22   Wendie Agreste, MD  simvastatin (ZOCOR) 20 MG tablet Take 1 tablet (20 mg total) by mouth at bedtime. 01/21/22   Wendie Agreste, MD  sucralfate (CARAFATE) 1 g tablet Take 1 tablet (1 g total) by mouth 3 (three) times daily before meals. 02/12/22   Eliezer Lofts, FNP    Family History Family History  Problem Relation Age of Onset  Heart disease Mother    Hyperlipidemia Mother    Diabetes Mother    Macular degeneration Mother    Cancer Father        skin   Macular degeneration Sister    Diabetes Brother    Colon cancer Neg Hx    Stomach cancer Neg Hx    Esophageal cancer Neg Hx    Rectal cancer Neg Hx     Social History Social History   Tobacco Use   Smoking status: Never   Smokeless tobacco: Former    Types: Chew   Tobacco comments:    Quit age 62 y.o  Vaping Use   Vaping Use: Never used  Substance Use Topics   Alcohol use: No   Drug use: No     Allergies   Amlodipine, Codeine, Ivp dye [iodinated contrast media], Penicillins, and Sulfa antibiotics   Review of Systems Review of Systems   Physical Exam Triage Vital Signs ED Triage Vitals  Enc Vitals Group     BP 05/18/22 1126 134/86     Pulse Rate 05/18/22 1125 87     Resp 05/18/22 1125 18     Temp 05/18/22 1125 98 F (36.7 C)     Temp Source 05/18/22 1125 Oral     SpO2 05/18/22 1125 95 %     Weight --      Height --      Head Circumference --      Peak Flow --      Pain Score 05/18/22 1127 5     Pain Loc --      Pain Edu? --      Excl. in [place]? --    No data found.  Updated Vital Signs BP 134/86 (BP Location: Right Arm)   Pulse 87   Temp 98 F (36.7 C) (Oral)   Resp 18   SpO2 95%   Visual Acuity Right Eye Distance:   Left Eye Distance:   Bilateral Distance:    Right Eye Near:   Left Eye Near:     Bilateral Near:     Physical Exam Vitals and nursing note reviewed.  Constitutional:      General: He is not in acute distress.    Appearance: Normal appearance.  HENT:     Head: Normocephalic.     Right Ear: Tympanic membrane, ear canal and external ear normal.     Left Ear: Tympanic membrane, ear canal and external ear normal.     Nose: Congestion present. No rhinorrhea.     Right Turbinates: Enlarged and swollen.     Left Turbinates: Enlarged and swollen.     Right Sinus: No maxillary sinus tenderness or frontal sinus tenderness.     Left Sinus: No maxillary sinus tenderness or frontal sinus tenderness.     Mouth/Throat:     Lips: Pink.     Mouth: Mucous membranes are moist.     Pharynx: Uvula midline. Posterior oropharyngeal erythema present. No pharyngeal swelling, oropharyngeal exudate or uvula swelling.     Comments: Cobblestoning on posterior oropharnyx Eyes:     Extraocular Movements: Extraocular movements intact.     Pupils: Pupils are equal, round, and reactive to light.  Cardiovascular:     Rate and Rhythm: Normal rate and regular rhythm.     Pulses: Normal pulses.     Heart sounds: Normal heart sounds.  Pulmonary:     Effort: Pulmonary effort is normal. No respiratory distress.     Breath sounds:  Normal breath sounds. No stridor. No wheezing, rhonchi or rales.  Abdominal:     General: Bowel sounds are normal.     Palpations: Abdomen is soft.     Tenderness: There is no abdominal tenderness.  Musculoskeletal:     Cervical back: Normal range of motion.  Lymphadenopathy:     Cervical: No cervical adenopathy.  Skin:    General: Skin is warm and dry.  Neurological:     General: No focal deficit present.     Mental Status: He is alert and oriented to person, place, and time.  Psychiatric:        Mood and Affect: Mood normal.        Behavior: Behavior normal.      UC Treatments / Results  Labs (all labs ordered are listed, but only abnormal results are  displayed) Labs Reviewed  SARS CORONAVIRUS 2 (TAT 6-24 HRS)  CULTURE, GROUP A STREP [facility])  POCT RAPID STREP A (OFFICE)  POCT INFLUENZA A/B    EKG   Radiology No results found.  Procedures Procedures (including critical care time)  Medications Ordered in UC Medications  alum & mag hydroxide-simeth (MAALOX/MYLANTA) 200-200-20 MG/5ML suspension 30 mL (30 mLs Oral Given 05/18/22 1159)    Initial Impression / Assessment and Plan / UC Course  I have reviewed the triage vital signs and the nursing notes.  Pertinent labs & imaging results that were available during my care of the patient were reviewed by me and considered in my medical decision making (see chart for details).  The patient is well-appearing, he is in no acute distress, vital signs are stable.  Rapid strep test and influenza test were negative.  A COVID test and throat culture are pending.  The patient is a candidate to receive molnupiravir if his COVID test is positive.  Suspect a viral illness with regard to his symptoms of fever.  With regard to his nasal drainage, sore throat most likely caused by postnasal drip.  For patient's cough, Bromfed-DM was prescribed, for his chronic allergic rhinitis, patient was prescribed a 5-day course of prednisone 40 mg.  Supportive care recommendations were provided to the patient along with indications of when follow-up may be necessary.  Patient is in agreement with this plan of care and verbalizes understanding.  All questions were answered.  Patient stable for discharge.   Final Clinical Impressions(s) / UC Diagnoses   Final diagnoses:  Viral illness  History of allergic rhinitis  Encounter for screening for COVID-19     Discharge Instructions      The rapid strep test and influenza test were negative.  A throat culture and COVID test are pending.  You will be contacted if your pending test results are positive.  You are a candidate to receive molnupiravir if your COVID test  is positive. Take medication as prescribed. Monitor your blood glucose levels while taking prednisone. You can take Tylenol when taking Prednisone.  Take your allergy medication daily to help prevent allergy symptoms.  Increase fluids and allow for plenty of rest. May take over-the-counter Tylenol as needed for pain, fever, or general discomfort. Recommend normal saline nasal spray to help with nasal congestion and runny nose. Warm salt water gargles 3-4 times daily to help with sore throat. It appears this may be a viral illness.  Please be advised that a viral illness can last 7 to 14 days.  If your symptoms suddenly worsen before that time, or extend beyond that time, please follow-up with  your primary care physician for further evaluation. Follow-up as needed.     ED Prescriptions     Medication Sig Dispense Auth. Provider   predniSONE (DELTASONE) 20 MG tablet Take 2 tablets (40 mg total) by mouth daily with breakfast for 5 days. 10 tablet Shelah Heatley-Warren, Alda Lea, NP   brompheniramine-pseudoephedrine-DM 30-2-10 MG/5ML syrup Take 5 mLs by mouth 4 (four) times daily as needed. 140 mL Sheyna Pettibone-Warren, Alda Lea, NP      PDMP not reviewed this encounter.   Tish Men, NP 05/18/22 1230

## 2022-05-18 NOTE — Discharge Instructions (Addendum)
The rapid strep test and influenza test were negative.  A throat culture and COVID test are pending.  You will be contacted if your pending test results are positive.  You are a candidate to receive molnupiravir if your COVID test is positive. Take medication as prescribed. Monitor your blood glucose levels while taking prednisone. You can take Tylenol when taking Prednisone.  Take your allergy medication daily to help prevent allergy symptoms.  Increase fluids and allow for plenty of rest. May take over-the-counter Tylenol as needed for pain, fever, or general discomfort. Recommend normal saline nasal spray to help with nasal congestion and runny nose. Warm salt water gargles 3-4 times daily to help with sore throat. It appears this may be a viral illness.  Please be advised that a viral illness can last 7 to 14 days.  If your symptoms suddenly worsen before that time, or extend beyond that time, please follow-up with your primary care physician for further evaluation. Follow-up as needed.

## 2022-05-18 NOTE — ED Triage Notes (Signed)
Fever last night nasal drainage and sore throat.  Fever started yesterday.  Grandson has strep throat.  Has had nasal drainage x 2 weeks.  Has been taking Nasacort.

## 2022-05-19 LAB — SARS CORONAVIRUS 2 (TAT 6-24 HRS): SARS Coronavirus 2: NEGATIVE

## 2022-05-20 LAB — CULTURE, GROUP A STREP (THRC)

## 2022-05-22 ENCOUNTER — Telehealth (HOSPITAL_COMMUNITY): Payer: Self-pay | Admitting: Emergency Medicine

## 2022-05-22 MED ORDER — AMOXICILLIN 500 MG PO CAPS: 500.0000 mg | ORAL_CAPSULE | Freq: Two times a day (BID) | ORAL | 0 refills | Status: AC

## 2022-05-22 NOTE — Telephone Encounter (Signed)
Received return call from patient, he is still having sore throat, and was not able to get his cough medicine.

## 2022-06-12 ENCOUNTER — Ambulatory Visit: Payer: 59 | Admitting: Family Medicine

## 2022-06-12 ENCOUNTER — Encounter: Payer: Self-pay | Admitting: Family Medicine

## 2022-06-12 VITALS — BP 128/72 | HR 78 | Temp 98.4°F | Ht 71.0 in | Wt 231.6 lb

## 2022-06-12 DIAGNOSIS — G4700 Insomnia, unspecified: Secondary | ICD-10-CM | POA: Diagnosis not present

## 2022-06-12 DIAGNOSIS — R351 Nocturia: Secondary | ICD-10-CM

## 2022-06-12 NOTE — Progress Notes (Signed)
Subjective:  Patient ID: Shawn Ban., male    DOB: 1960/12/16  Age: 62 y.o. MRN: [id number]  CC:  Chief Complaint  Patient presents with   Insomnia    Return to discuss insomnia and discuss lab work. Pt states that he sleeps ok and wakes up to use restroom and can't go back to sleep.     HPI Shawn Ball   Insomnia Discussed at his March 6 physical.  At that time he was going to bed late and nighttime wakening Ball years without recent changes.  Handout was given on sleep hygiene.  He notes that he will wake up, have to use the restroom and then trouble getting back to sleep. If sugar runs a little higher in 140-150 - drinks a bottle of water to help get numbers down.  PSA normal 05/08/2022.  Denies recent changes. Nocturia 1-2 times per night. Incomplete empting at times, no retention. No daytime symptoms.  Sometimes drinks water before bedtime. Has been trying to cut back on fluids before bedtime - some help with nocturia. Plans otc prostate supplement.  Has been cutting back on caffeine in the evening.  No PND/orthopnea in middle of night.  Taking benadryl 1.5 at night Ball allergies.    History Patient Active Problem List   Diagnosis Date Noted   Acute viral conjunctivitis of right eye 08/28/2020   Routine general medical examination at a health care facility 05/01/2020   Hyperlipidemia LDL goal <70 03/15/2020   Hypertriglyceridemia 03/15/2020   Diabetes mellitus without complication    Benign essential HTN 08/25/2019   Past Medical History:  Diagnosis Date   Allergy    Arthritis    Chronic bronchitis    Chronic pain    per pt/ right shoulder pain, left leg and ankle   CKD (chronic kidney disease) stage 3, GFR 30-59 ml/min    Costochondral chest pain    due to torn tendons.   CTS (carpal tunnel syndrome)    right hand   Diabetes mellitus without complication    prediabetes/ no meds   Diverticulitis    GERD (gastroesophageal reflux  disease)    History of anal fissures    Hyperlipemia    Hypertension    Rectal bleeding    occasional   Seasonal allergies    Past Surgical History:  Procedure Laterality Date   ANKLE FRACTURE SURGERY  2005 / 2007   left ankle and  left leg/ have screws in leg and ankle   CARPAL TUNNEL RELEASE Left 07/29/2019   Procedure: CARPAL TUNNEL RELEASE;  Surgeon: Cindee Salt, MD;  Location: [facility];  Service: Orthopedics;  Laterality: Left;  FOREARM BLOCK   EXAMINATION UNDER ANESTHESIA N/A 12/15/2012   Procedure: EXAM UNDER ANESTHESIA;  Surgeon: Atilano Ina, MD;  Location: [facility];  Service: General;  Laterality: N/A;   FRACTURE SURGERY  2005 and 2007   lt ankle x2   HEMORRHOIDECTOMY WITH HEMORRHOID BANDING N/A 12/15/2012   Procedure: EXCISIONAL HEMORRHOIDECTOMY WITH HEMORRHOID BANDING;  Surgeon: Atilano Ina, MD;  Location: [facility];  Service: General;  Laterality: N/A;   rotator cuff surgery      x 2/ right shoulder   TRIGGER FINGER RELEASE  07/23/2011   Procedure: Right hand- RELEASE TRIGGER FINGER/A-1 PULLEY;  Surgeon: Wyn Forster., MD;  Location:  SURGERY CENTER;  Service: Orthopedics;  Laterality: Right;   WRIST FRACTURE SURGERY  1984  lt with bone graft   WRIST SURGERY     left wrist   Allergies  Allergen Reactions   Amlodipine Other (See Comments)    Per pt - damage to kidney's   Codeine Nausea And Vomiting   Ivp Dye [Iodinated Contrast Media] Nausea Only    Nauseated   Penicillins Nausea And Vomiting    Patient states he has taken Amoxicillin without issue recently   Sulfa Antibiotics Nausea Only    nauseated   Prior to Admission medications   Medication Sig Start Date End Date Taking? Authorizing Provider  ASPIRIN 81 PO Take 1 tablet by mouth daily.   Yes [provider]  Biotin 5000 MCG CAPS 2 times a week 12/03/14  Yes [provider]  brompheniramine-pseudoephedrine-DM 30-2-10  MG/5ML syrup Take 5 mLs by mouth 4 (four) times daily as needed. 05/18/22  Yes Leath-Warren, Sadie Haber, NP  carvedilol (COREG) 6.25 MG tablet Take 1 tablet (6.25 mg total) by mouth 2 (two) times daily with a meal. 01/21/22  Yes Shade Flood, MD  cetirizine (ZYRTEC) 10 MG tablet Take 1 tablet (10 mg total) by mouth daily. 10/28/19  Yes Donita Brooks, MD  Collagen Hydrolysate, Bovine, POWD by Does not apply route. Takes Tour manager" Ball arthritis   Yes [provider]  FIBER COMPLETE PO Take by mouth. Vita Fusion fiber gummies   Yes [provider]  fluticasone (FLONASE) 50 MCG/ACT nasal spray Place 2 sprays into both nostrils daily. 04/27/18  Yes Donita Brooks, MD  Garlic Oil 1000 MG CAPS  10/02/20  Yes [provider]  Glucosamine-Chondroitin-MSM 500-200-150 MG TABS Take by mouth.   Yes [provider]  L-Lysine 1000 MG TABS  03/04/12  Yes [provider]  Lancets (FREESTYLE) lancets Use as instructed 10/28/19  Yes Donita Brooks, MD  linagliptin (TRADJENTA) 5 MG TABS tablet Take 1 tablet (5 mg total) by mouth daily. 01/21/22  Yes Shade Flood, MD  losartan (COZAAR) 100 MG tablet Take 1 tablet by mouth daily.   Yes [provider]  MAGNESIUM OXIDE PO Take 375 mg by mouth daily.   Yes [provider]  mometasone (ELOCON) 0.1 % ointment Apply topically daily.   Yes [provider]  mometasone-formoterol (DULERA) 100-5 MCG/ACT AERO Inhale 2 puffs into the lungs 2 (two) times daily.   Yes [provider]  Multiple Vitamins-Minerals (VISION FORMULA EYE HEALTH PO)  06/02/17  Yes [provider]  omega-3 acid ethyl esters (LOVAZA) 1 g capsule Take 2 capsules (2 g total) by mouth 2 (two) times daily. 08/29/21  Yes Shade Flood, MD  omeprazole (PRILOSEC) 20 MG capsule Take 1 capsule (20 mg total) by mouth 2 (two) times daily before a meal. 08/29/21  Yes Shade Flood, MD  ondansetron (ZOFRAN-ODT) 4 MG  disintegrating tablet Take 1 tablet (4 mg total) by mouth every 8 (eight) hours as needed Ball nausea or vomiting. 02/12/22  Yes Trevor Iha, FNP  ONETOUCH VERIO test strip CHECK BLOOD SUGARS IN THE MORNING AND AT BEDTIME. 05/02/22  Yes Shade Flood, MD  simvastatin (ZOCOR) 20 MG tablet Take 1 tablet (20 mg total) by mouth at bedtime. 01/21/22  Yes Shade Flood, MD  sucralfate (CARAFATE) 1 g tablet Take 1 tablet (1 g total) by mouth 3 (three) times daily before meals. 02/12/22  Yes Trevor Iha, FNP  carvedilol (COREG) 12.5 MG tablet     [provider]  Cetirizine HCl (ZYRTEC ALLERGY)  10 MG CAPS Take by oral route.    [provider]  L-Lysine 1000 MG TABS Take by mouth.    [provider]  losartan (COZAAR) 100 MG tablet Take 1 tablet (100 mg total) by mouth daily. 01/21/22   Shade Flood, MD   Social History   Socioeconomic History   Marital status: Divorced    Spouse name: Not on file   Number of children: 1   Years of education: Not on file   Highest education level: Some college, no degree  Occupational History   Occupation: disabled  Tobacco Use   Smoking status: Never   Smokeless tobacco: Former    Types: Chew   Tobacco comments:    Quit age 24 y.o  Vaping Use   Vaping Use: Never used  Substance and Sexual Activity   Alcohol use: No   Drug use: No   Sexual activity: Not Currently  Other Topics Concern   Not on file  Social History Narrative   Not on file   Social Determinants of Health   Financial Resource Strain: Low Risk  (06/10/2022)   Overall Financial Resource Strain (CARDIA)    Difficulty of Paying Living Expenses: Not hard at all  Food Insecurity: No Food Insecurity (06/10/2022)   Hunger Vital Sign    Worried About Running Out of Food in the Last Year: Never true    Ran Out of Food in the Last Year: Never true  Transportation Needs: No Transportation Needs (06/10/2022)   PRAPARE - Scientist, research (physical sciences) (Medical): No    Lack of Transportation (Non-Medical): No  Physical Activity: Unknown (06/10/2022)   Exercise Vital Sign    Days of Exercise per Week: 0 days    Minutes of Exercise per Session: Not on file  Stress: No Stress Concern Present (06/10/2022)   [employer] of Occupational Health - Occupational Stress Questionnaire    Feeling of Stress : Not at all  Social Connections: Moderately Integrated (06/10/2022)   Social Connection and Isolation Panel [NHANES]    Frequency of Communication with Friends and Family: Twice a week    Frequency of Social Gatherings with Friends and Family: Twice a week    Attends Religious Services: More than 4 times per year    Active Member of [employer] or Organizations: Yes    Attends Engineer, structural: More than 4 times per year    Marital Status: Divorced  Catering manager Violence: Not on file    Review of Systems   Objective:   Vitals:   06/12/22 0951  BP: 128/72  Pulse: 78  Temp: 98.4 F (36.9 C)  TempSrc: Temporal  SpO2: 98%  Weight: 231 lb 9.6 oz (105.1 kg)  Height: 5\' 11"  (1.803 m)     Physical Exam Constitutional:      General: He is not in acute distress.    Appearance: Normal appearance. He is well-developed.  HENT:     Head: Normocephalic and atraumatic.  Cardiovascular:     Rate and Rhythm: Normal rate.  Pulmonary:     Effort: Pulmonary effort is normal.  Abdominal:     General: There is no distension.     Tenderness: There is no abdominal tenderness.  Neurological:     Mental Status: He is alert and oriented to person, place, and time.  Psychiatric:        Mood and Affect: Mood normal.        Assessment & Plan:  Shawn BanJoseph R Kachel Jr. is a 62 y.o. male . Nocturia  Insomnia, unspecified type Insomnia appears to be due to nocturia 1-2 episodes per night then difficulty falling back asleep.  Sleep onset seems to be okay.  Nocturia may be related to enlarged prostate, recent PSA  reassuring, no significant daytime symptoms.  Recommended against drinking a bottle of water Ball blood sugars in the 140-150 range and discussed goal postprandials and most recent A1c looks good.  -Decrease fluid intake 1 to 2 hours prior to bedtime.  Saw palmetto supplement over-the-counter okay Ball now but would consider urology eval or trial of alpha-blocker or other meds if persistent nocturia  -Recheck 2 months Ball repeat labs, RTC precautions if new or worsening symptoms sooner.  No orders of the defined types were placed in this encounter.  Patient Instructions  Try cutting back on fluids within an hour to bedtime.  Saw palmetto over-the-counter is an option Ball prostate health but if you continue to have nighttime urination in spite of cutting back on fluids before bedtime, I would consider meeting with urology or discussion of a prescription medicine Ball possible enlarged prostate.  If sleep does not improve with decreased urination at night, follow-up and we can discuss other options.  Blood work was overall reassuring last visit.  Remember that the goal Ball postprandial or after meals blood sugar is under 180, so not concerned with an occasional 140 or 150. Follow-up with me in 2 months to recheck sleep and diabetes.  Take care!      Signed,   Meredith StaggersJeffrey Geroge Gilliam, MD [facility], [facility] 06/12/22 10:42 AM

## 2022-06-12 NOTE — Patient Instructions (Signed)
Try cutting back on fluids within an hour to bedtime.  Saw palmetto over-the-counter is an option for prostate health but if you continue to have nighttime urination in spite of cutting back on fluids before bedtime, I would consider meeting with urology or discussion of a prescription medicine for possible enlarged prostate.  If sleep does not improve with decreased urination at night, follow-up and we can discuss other options.  Blood work was overall reassuring last visit.  Remember that the goal for postprandial or after meals blood sugar is under 180, so not concerned with an occasional 140 or 150. Follow-up with me in 2 months to recheck sleep and diabetes.  Take care!

## 2022-06-27 ENCOUNTER — Other Ambulatory Visit: Payer: Self-pay | Admitting: Family Medicine

## 2022-06-27 DIAGNOSIS — E1122 Type 2 diabetes mellitus with diabetic chronic kidney disease: Secondary | ICD-10-CM

## 2022-07-01 ENCOUNTER — Other Ambulatory Visit: Payer: Self-pay | Admitting: Family Medicine

## 2022-07-01 DIAGNOSIS — E781 Pure hyperglyceridemia: Secondary | ICD-10-CM

## 2022-07-22 ENCOUNTER — Encounter: Payer: Self-pay | Admitting: Podiatry

## 2022-07-22 ENCOUNTER — Ambulatory Visit: Payer: 59 | Admitting: Podiatry

## 2022-07-22 DIAGNOSIS — G5762 Lesion of plantar nerve, left lower limb: Secondary | ICD-10-CM | POA: Diagnosis not present

## 2022-07-24 LAB — HM DIABETES EYE EXAM

## 2022-07-24 NOTE — Progress Notes (Signed)
Subjective:   Patient ID: Shawn Ban., male   DOB: 62 y.o.   MRN: [id number]   HPI Patient states he is developed shooting pain in his left third and fourth toes and he had this years ago we use medication which worked successfully at the time.  Patient does not smoke likes to be active   Review of Systems  All other systems reviewed and are negative.       Objective:  Physical Exam Vitals and nursing note reviewed.  Constitutional:      Appearance: He is well-developed.  Pulmonary:     Effort: Pulmonary effort is normal.  Musculoskeletal:        General: Normal range of motion.  Skin:    General: Skin is warm.  Neurological:     Mental Status: He is alert.     Neurovascular status intact muscle strength adequate range of motion adequate with exquisite discomfort left third interspace positive Mulder sign in the area with radiating discomfort into the adjacent digits.  Good digital perfusion well-oriented x 3     Assessment:  For neuroma symptomatology left with possibility for inflammatory capsulitis stress fracture     Plan:  H&P x-ray taken went ahead today did sterile prep injected the third interspace with a purified alcohol solution Marcaine that have been done about 15 years ago and was successful at the time  X-rays were negative for signs of fracture or other indications of bony pathology associated with condition

## 2022-08-07 ENCOUNTER — Ambulatory Visit: Payer: 59 | Admitting: Podiatry

## 2022-08-08 ENCOUNTER — Ambulatory Visit: Payer: 59 | Admitting: Podiatry

## 2022-08-08 ENCOUNTER — Encounter: Payer: Self-pay | Admitting: Podiatry

## 2022-08-08 DIAGNOSIS — G5762 Lesion of plantar nerve, left lower limb: Secondary | ICD-10-CM

## 2022-08-09 NOTE — Progress Notes (Signed)
Subjective:   Patient ID: Shawn Ban., male   DOB: 62 y.o.   MRN: [id number]   HPI Patient states he is improved with 1 area of discomfort still noted between the third and fourth toes left   ROS      Objective:  Physical Exam  Neurovascular status intact improvement in discomfort of the third interspace left foot mild discomfort still only upon deep palpation     Assessment:  Neuroma symptomatology left improved still mild present     Plan:  H&P reviewed condition and discussed.  At this point we are going to do 1 more injection and I did do sterile prep and using a purified alcohol Marcaine solution I injected 1.5 cc neurolysis injection third interspace left applied sterile dressing reappoint as symptoms indicate

## 2022-08-11 ENCOUNTER — Other Ambulatory Visit: Payer: Self-pay | Admitting: Family Medicine

## 2022-08-11 DIAGNOSIS — E1122 Type 2 diabetes mellitus with diabetic chronic kidney disease: Secondary | ICD-10-CM

## 2022-08-14 ENCOUNTER — Other Ambulatory Visit: Payer: Self-pay | Admitting: Family Medicine

## 2022-08-14 DIAGNOSIS — E781 Pure hyperglyceridemia: Secondary | ICD-10-CM

## 2022-08-15 ENCOUNTER — Ambulatory Visit: Payer: 59 | Admitting: Family Medicine

## 2022-08-15 ENCOUNTER — Encounter: Payer: Self-pay | Admitting: Family Medicine

## 2022-08-15 VITALS — BP 118/76 | HR 72 | Temp 97.6°F | Ht 71.0 in | Wt 214.8 lb

## 2022-08-15 DIAGNOSIS — Z7984 Long term (current) use of oral hypoglycemic drugs: Secondary | ICD-10-CM

## 2022-08-15 DIAGNOSIS — N182 Chronic kidney disease, stage 2 (mild): Secondary | ICD-10-CM

## 2022-08-15 DIAGNOSIS — E1122 Type 2 diabetes mellitus with diabetic chronic kidney disease: Secondary | ICD-10-CM

## 2022-08-15 DIAGNOSIS — L989 Disorder of the skin and subcutaneous tissue, unspecified: Secondary | ICD-10-CM | POA: Diagnosis not present

## 2022-08-15 DIAGNOSIS — I10 Essential (primary) hypertension: Secondary | ICD-10-CM | POA: Diagnosis not present

## 2022-08-15 DIAGNOSIS — K219 Gastro-esophageal reflux disease without esophagitis: Secondary | ICD-10-CM

## 2022-08-15 DIAGNOSIS — R351 Nocturia: Secondary | ICD-10-CM

## 2022-08-15 LAB — BASIC METABOLIC PANEL
BUN: 16 mg/dL (ref 6–23)
CO2: 29 mEq/L (ref 19–32)
Calcium: 9.8 mg/dL (ref 8.4–10.5)
Chloride: 102 mEq/L (ref 96–112)
Creatinine, Ser: 1.17 mg/dL (ref 0.40–1.50)
GFR: 67.73 mL/min (ref >60.00)
Glucose, Bld: 124 mg/dL — ABNORMAL HIGH (ref 70–99)
Potassium: 4.1 mEq/L (ref 3.5–5.1)
Sodium: 139 mEq/L (ref 135–145)

## 2022-08-15 LAB — HEMOGLOBIN A1C: Hgb A1c MFr Bld: 6.2 % (ref 4.6–6.5)

## 2022-08-15 MED ORDER — OMEPRAZOLE 20 MG PO CPDR: 20.0000 mg | DELAYED_RELEASE_CAPSULE | Freq: Two times a day (BID) | ORAL | 3 refills | Status: DC

## 2022-08-15 MED ORDER — LINAGLIPTIN 5 MG PO TABS: 5.0000 mg | ORAL_TABLET | Freq: Every day | ORAL | 2 refills | Status: DC

## 2022-08-15 MED ORDER — CARVEDILOL 6.25 MG PO TABS: 6.2500 mg | ORAL_TABLET | Freq: Two times a day (BID) | ORAL | 2 refills | Status: DC

## 2022-08-15 NOTE — Patient Instructions (Signed)
I will refer you to urology to discuss the nighttime urination and dermatology for the right forearm.  Labs today.  If any concerns I will let you know, 18-month follow-up.  No med changes for now.  Take care!

## 2022-08-15 NOTE — Progress Notes (Signed)
Subjective:  Patient ID: Shawn Ban., male    DOB: 09/15/1960  Age: 62 y.o. MRN: [id number]  CC:  Chief Complaint  Patient presents with   Medical Management of Chronic Issues   Skin Problem    Pt has a spot he would like looked at unknown if wart or other skin issue didn't know who he should see     HPI Shawn Ball. presents for   Skin concern New problem. R forearm, past few months. Elevated skin lesion. Irritated and bleeding with messing with it. No creams/lotions.  No hx of skin CA.   Nocturia, insomnia Discussed at April visit.  Insomnia felt to be due to nocturia, sleep onset.  Be okay at that time.  Recent PSA had been reassuring but possible enlarged prostate is contributing.  Recommended decrease fluid intake 1 to 2 hours prior to bedtime.  Option of urology eval versus alpha-blocker if persistent. Still 1-2 nocturia episodes per night. Decreasing fluids before bedtime.  Tried saw palmetto - had some side effects.   Lab Results  Component Value Date   PSA 0.34 05/08/2022   PSA 0.32 05/04/2021   PSA 0.33 05/01/2020    Diabetes: Complicated by hyperglycemia, microalbuminuria, CKD. Treated with Tradjenta.  Prior diet/herbal approach.  He is on statin and ACE inhibitor.   Home readings fasting: 127-137 Postprandial: 167 No symptomatic lows. Microalbumin: 11.1 on 05/08/2022 Optho, foot exam, pneumovax:  Ophthalmology exam - recent visit- Dr. Randon Goldsmith.  Lab Results  Component Value Date   HGBA1C 6.4 05/08/2022   HGBA1C 6.7 (H) 01/21/2022   HGBA1C 6.4 (A) 08/29/2021   Lab Results  Component Value Date   MICROALBUR 11.1 (H) 05/08/2022   LDLCALC 73 05/08/2022   CREATININE 1.24 05/08/2022     History Patient Active Problem List   Diagnosis Date Noted   Acute viral conjunctivitis of right eye 08/28/2020   Routine general medical examination at a health care facility 05/01/2020   Hyperlipidemia LDL goal <70 03/15/2020   Hypertriglyceridemia  03/15/2020   Diabetes mellitus without complication (HCC)    Benign essential HTN 08/25/2019   Past Medical History:  Diagnosis Date   Allergy    Arthritis    Chronic bronchitis    Chronic pain    per pt/ right shoulder pain, left leg and ankle   CKD (chronic kidney disease) stage 3, GFR 30-59 ml/min (HCC)    Costochondral chest pain    due to torn tendons.   CTS (carpal tunnel syndrome)    right hand   Diabetes mellitus without complication (HCC)    prediabetes/ no meds   Diverticulitis    GERD (gastroesophageal reflux disease)    History of anal fissures    Hyperlipemia    Hypertension    Rectal bleeding    occasional   Seasonal allergies    Past Surgical History:  Procedure Laterality Date   ANKLE FRACTURE SURGERY  2005 / 2007   left ankle and  left leg/ have screws in leg and ankle   CARPAL TUNNEL RELEASE Left 07/29/2019   Procedure: CARPAL TUNNEL RELEASE;  Surgeon: Cindee Salt, MD;  Location: [facility];  Service: Orthopedics;  Laterality: Left;  FOREARM BLOCK   EXAMINATION UNDER ANESTHESIA N/A 12/15/2012   Procedure: EXAM UNDER ANESTHESIA;  Surgeon: Atilano Ina, MD;  Location: [facility];  Service: General;  Laterality: N/A;   FRACTURE SURGERY  2005 and 2007   lt ankle x2  HEMORRHOIDECTOMY WITH HEMORRHOID BANDING N/A 12/15/2012   Procedure: EXCISIONAL HEMORRHOIDECTOMY WITH HEMORRHOID BANDING;  Surgeon: Atilano Ina, MD;  Location: [facility];  Service: General;  Laterality: N/A;   rotator cuff surgery      x 2/ right shoulder   TRIGGER FINGER RELEASE  07/23/2011   Procedure: Right hand- RELEASE TRIGGER FINGER/A-1 PULLEY;  Surgeon: Wyn Forster., MD;  Location: [facility];  Service: Orthopedics;  Laterality: Right;   WRIST FRACTURE SURGERY  1984   lt with bone graft   WRIST SURGERY     left wrist   Allergies  Allergen Reactions   Amlodipine Other (See Comments)    Per pt - damage to kidney's    Codeine Nausea And Vomiting   Ivp Dye [Iodinated Contrast Media] Nausea Only    Nauseated   Penicillins Nausea And Vomiting    Patient states he has taken Amoxicillin without issue recently   Sulfa Antibiotics Nausea Only    nauseated   Prior to Admission medications   Medication Sig Start Date End Date Taking? Authorizing Provider  ASPIRIN 81 PO Take 1 tablet by mouth daily.    [provider]  Biotin 5000 MCG CAPS 2 times a week 12/03/14   [provider]  brompheniramine-pseudoephedrine-DM 30-2-10 MG/5ML syrup Take 5 mLs by mouth 4 (four) times daily as needed. 05/18/22   Leath-Warren, Sadie Haber, NP  carvedilol (COREG) 6.25 MG tablet Take 1 tablet (6.25 mg total) by mouth 2 (two) times daily with a meal. 01/21/22   Shade Flood, MD  cetirizine (ZYRTEC) 10 MG tablet Take 1 tablet (10 mg total) by mouth daily. 10/28/19   Donita Brooks, MD  Cetirizine HCl (ZYRTEC ALLERGY) 10 MG CAPS Take by oral route.    [provider]  Collagen Hydrolysate, Bovine, POWD by Does not apply route. Takes Tour manager" for arthritis    [provider]  FIBER COMPLETE PO Take by mouth. Vita Fusion fiber gummies    [provider]  fluticasone (FLONASE) 50 MCG/ACT nasal spray Place 2 sprays into both nostrils daily. 04/27/18   Donita Brooks, MD  Garlic Oil 1000 MG CAPS  10/02/20   [provider]  Glucosamine-Chondroitin-MSM 500-200-150 MG TABS Take by mouth.    [provider]  L-Lysine 1000 MG TABS Take by mouth.    [provider]  L-Lysine 1000 MG TABS  03/04/12   [provider]  Lancets (FREESTYLE) lancets Use as instructed 10/28/19   Donita Brooks, MD  linagliptin (TRADJENTA) 5 MG TABS tablet Take 1 tablet (5 mg total) by mouth daily. 01/21/22   Shade Flood, MD  losartan (COZAAR) 100 MG tablet Take 1 tablet by mouth daily.    [provider]  MAGNESIUM OXIDE PO Take 375 mg by mouth daily.    [provider]  mometasone (ELOCON) 0.1 % ointment Apply topically daily.    [provider]  mometasone-formoterol (DULERA) 100-5 MCG/ACT AERO Inhale 2 puffs into the lungs 2 (two) times daily.    [provider]  Multiple Vitamins-Minerals (VISION FORMULA EYE HEALTH PO)  06/02/17   [provider]  omega-3 acid ethyl esters (LOVAZA) 1 g capsule Take 2 capsules by mouth twice daily 08/14/22   Shade Flood, MD  omeprazole (PRILOSEC) 20 MG capsule Take 1 capsule (20 mg total) by mouth 2 (two) times daily before a meal. 08/29/21   Shade Flood, MD  ondansetron (  ZOFRAN-ODT) 4 MG disintegrating tablet Take 1 tablet (4 mg total) by mouth every 8 (eight) hours as needed for nausea or vomiting. 02/12/22   Trevor Iha, FNP  ONETOUCH VERIO test strip USE 1 STRIP TO CHECK GLUCOSE IN THE MORNING AND AT BEDTIME 08/12/22   Shade Flood, MD  simvastatin (ZOCOR) 20 MG tablet Take 1 tablet (20 mg total) by mouth at bedtime. 01/21/22   Shade Flood, MD  sucralfate (CARAFATE) 1 g tablet Take 1 tablet (1 g total) by mouth 3 (three) times daily before meals. 02/12/22   Trevor Iha, FNP   Social History   Socioeconomic History   Marital status: Divorced    Spouse name: Not on file   Number of children: 1   Years of education: Not on file   Highest education level: Some college, no degree  Occupational History   Occupation: disabled  Tobacco Use   Smoking status: Never   Smokeless tobacco: Former    Types: Chew   Tobacco comments:    Quit age 5 y.o  Vaping Use   Vaping Use: Never used  Substance and Sexual Activity   Alcohol use: No   Drug use: No   Sexual activity: Not Currently  Other Topics Concern   Not on file  Social History Narrative   Not on file   Social Determinants of Health   Financial Resource Strain: Low Risk  (06/10/2022)   Overall Financial Resource Strain (CARDIA)    Difficulty of Paying Living Expenses: Not hard at all  Food  Insecurity: No Food Insecurity (06/10/2022)   Hunger Vital Sign    Worried About Running Out of Food in the Last Year: Never true    Ran Out of Food in the Last Year: Never true  Transportation Needs: No Transportation Needs (06/10/2022)   PRAPARE - Administrator, Civil Service (Medical): No    Lack of Transportation (Non-Medical): No  Physical Activity: Unknown (06/10/2022)   Exercise Vital Sign    Days of Exercise per Week: 0 days    Minutes of Exercise per Session: Not on file  Stress: No Stress Concern Present (06/10/2022)   [employer] of Occupational Health - Occupational Stress Questionnaire    Feeling of Stress : Not at all  Social Connections: Moderately Integrated (06/10/2022)   Social Connection and Isolation Panel [NHANES]    Frequency of Communication with Friends and Family: Twice a week    Frequency of Social Gatherings with Friends and Family: Twice a week    Attends Religious Services: More than 4 times per year    Active Member of [employer] or Organizations: Yes    Attends Engineer, structural: More than 4 times per year    Marital Status: Divorced  Catering manager Violence: Not on file    Review of Systems Per HPI.   Objective:   Vitals:   08/15/22 0823  BP: 118/76  Pulse: 72  Temp: 97.6 F (36.4 C)  TempSrc: Temporal  SpO2: 97%  Weight: 214 lb 12.8 oz (97.4 kg)  Height: 5\' 11"  (1.803 m)     Physical Exam Vitals reviewed.  Constitutional:      Appearance: He is well-developed.  HENT:     Head: Normocephalic and atraumatic.  Neck:     Vascular: No carotid bruit or JVD.  Cardiovascular:     Rate and Rhythm: Normal rate and regular rhythm.     Heart sounds: Normal heart sounds. No murmur heard. Pulmonary:  Effort: Pulmonary effort is normal.     Breath sounds: Normal breath sounds. No rales.  Musculoskeletal:     Right lower leg: No edema.     Left lower leg: No edema.  Skin:    General: Skin is warm and dry.      Comments: R forearm elevated, round 3mm flesh colored lesion. See photo  Neurological:     Mental Status: He is alert and oriented to person, place, and time.  Psychiatric:        Mood and Affect: Mood normal.       Assessment & Plan:  Shawn Regis. is a 62 y.o. male . Skin lesion of right arm - Plan: Ambulatory referral to Dermatology  -The lesion past few months, growing, irritated.  Suspicious for possible squamous cell carcinoma, less likely verrucal lesion.  Refer to dermatology for likely excision/biopsy.  Benign essential HTN - Plan: carvedilol (COREG) 6.25 MG tablet  -Stable, continue same med regimen  Gastroesophageal reflux disease, unspecified whether esophagitis present - Plan: omeprazole (PRILOSEC) 20 MG capsule  -Prilosec refilled.  Type 2 diabetes mellitus with stage 2 chronic kidney disease, without long-term current use of insulin (HCC) - Plan: linagliptin (TRADJENTA) 5 MG TABS tablet  -Tolerating current med regimen as above, check updated A1c.  Nocturia - Plan: Ambulatory referral to Urology  -Possible BPH, will refer to urology for exam further discussion, consideration of alpha-blocker.  No new meds for now.  Meds ordered this encounter  Medications   carvedilol (COREG) 6.25 MG tablet    Sig: Take 1 tablet (6.25 mg total) by mouth 2 (two) times daily with a meal.    Dispense:  180 tablet    Refill:  2   omeprazole (PRILOSEC) 20 MG capsule    Sig: Take 1 capsule (20 mg total) by mouth 2 (two) times daily before a meal.    Dispense:  180 capsule    Refill:  3   linagliptin (TRADJENTA) 5 MG TABS tablet    Sig: Take 1 tablet (5 mg total) by mouth daily.    Dispense:  90 tablet    Refill:  2   Patient Instructions  I will refer you to urology to discuss the nighttime urination and dermatology for the right forearm.  Labs today.  If any concerns I will let you know, 40-month follow-up.  No med changes for now.  Take care!    Signed,   Meredith Staggers, MD [facility], [facility] 08/15/22 8:29 AM

## 2022-08-23 ENCOUNTER — Encounter: Payer: Self-pay | Admitting: Family Medicine

## 2022-08-23 ENCOUNTER — Ambulatory Visit: Payer: 59 | Admitting: Family Medicine

## 2022-08-23 VITALS — BP 128/70 | HR 85 | Temp 98.0°F | Ht 71.0 in | Wt 216.0 lb

## 2022-08-23 DIAGNOSIS — S70362A Insect bite (nonvenomous), left thigh, initial encounter: Secondary | ICD-10-CM

## 2022-08-23 DIAGNOSIS — R21 Rash and other nonspecific skin eruption: Secondary | ICD-10-CM

## 2022-08-23 DIAGNOSIS — W57XXXA Bitten or stung by nonvenomous insect and other nonvenomous arthropods, initial encounter: Secondary | ICD-10-CM

## 2022-08-23 MED ORDER — TRIAMCINOLONE ACETONIDE 0.1 % EX CREA: 1.0000 | TOPICAL_CREAM | Freq: Two times a day (BID) | CUTANEOUS | 0 refills | Status: DC

## 2022-08-23 MED ORDER — DOXYCYCLINE HYCLATE 100 MG PO TABS: 100.0000 mg | ORAL_TABLET | Freq: Two times a day (BID) | ORAL | 0 refills | Status: DC

## 2022-08-23 NOTE — Patient Instructions (Addendum)
Area around tick bite could be some redness or bruising from the initial injury and removing the tick, but I think it is reasonable to start antibiotics to cover for possible tickborne illness or less likely early erythema migrans (bull's-eye rash seen with Lyme disease).  Again this is unlikely but can try antibiotic initially.  If you are unable to tolerate the antibiotic, let me know and we may try coming off of it.  Make sure to avoid the sun or use more sunblock on the antibiotic as it does make you more prone to sunburn.  Can take it with food if needed.   Triamcinolone steroid cream can be used for itching if needed at this area or other bites or stings.  Follow-up if that is needed persistently.  If any new or worsening symptoms be seen.  Take care!  Tick Bite Information, Adult  Ticks are insects that draw blood for food. They climb onto people and animals that brush against the leaves and grasses that they live in. They then bite and attach to the skin. Most ticks are harmless, but some ticks may carry germs that can cause disease. These germs are spread to a person through a bite. To lower your risk of getting a disease from a tick bite, make sure you: Take steps to prevent tick bites. Check for ticks after being outdoors where ticks live. Watch for symptoms of disease if a tick attached to you or if you think a tick bit you. How can I prevent tick bites? Take these steps to help prevent tick bites when you go outdoors in an area where ticks live: Before you go outdoors: Wear long sleeves and long pants to protect your skin from ticks. Wear light-colored clothing so you can see ticks easier. Tuck your pant legs into your socks. Apply insect repellent that has DEET (20% or higher), picaridin, or IR3535 in it to the following areas: Any bare skin. Avoid areas around the eyes and mouth. Edges of clothing, like the top of your boots, the bottom of your pant legs, and your sleeve  cuffs. Consider applying an insect repellant that contains permethrin. Follow the instructions on the label. Do not apply permethrin directly to the skin. Instead, apply to the following areas: Clothing and shoes. Outdoor gear and tents. When you are outdoors: Avoid walking through areas with long grass. If you are walking on a trail, stay in the middle of the trail so your skin, hair, and clothing do not touch the bushes. Check for ticks on your clothing, hair, and skin often while you are outdoors. Check again before you go inside. When you go indoors: Check your clothing for ticks. Tumble dry clothes in a dryer on high heat for at least 10 minutes. If clothes are damp, additional time may be needed. If clothes require washing, use hot water. Check your gear and pets. Shower soon after being outdoors. Check your body for ticks. Do a full body check using a mirror. Be sure to check your scalp, neck, armpits, waist, groin, and joint areas. These are the spots where ticks attach themselves most often. What is the best way to remove a tick?  Remove the tick as soon as possible. Removing it can prevent germs from passing to your body. Do not remove the tick with your bare fingers. Do not try to remove a tick with heat, alcohol, petroleum jelly, or fingernail polish. These things can cause the tick to salivate and regurgitate into your  bloodstream, increasing your risk of getting a disease. To remove a tick that is crawling on your skin: Go outside and brush the tick off. Use tape or a lint roller. To remove a tick that is attached to your skin: Wash your hands. If you have gloves, put them on. Use a fine-tipped tweezer, curved forceps, or a tick-removal tool to gently grasp the tick as close to your skin and the tick's head as possible. Gently pull with a steady, upward, and even pressure until the tick lets go. While removing the tick: Take care to keep the tick's head attached to its  body. Do not twist or jerk the tick. This can make the tick's head or mouth parts break off and stay in your skin. If this happens, try to remove the mouth parts with tweezers. If you cannot remove them, leave the area alone and let the skin heal. Do not squeeze or crush the tick's body. This could force disease-carrying fluids from the tick into your body. What should I do after removing a tick? Clean the bite area and your hands with soap and water, rubbing alcohol, or an iodine scrub. If an antiseptic cream or ointment is available, put a small amount on the bite area. Wash and disinfect any tools that you used to remove the tick. How should I dispose of a tick? To dispose of a live tick, use one of these methods: Place it in rubbing alcohol. Place it in a sealed bag or container, and throw it away. Wrap it tightly in tape, and throw it away. Flush it down the toilet. Where to find more information Centers for Disease Control and Prevention: GyrateAtrophy.si U.[place]. Environmental Protection Agency: RelocationNetworking.fi Contact a health care provider if: You have symptoms of a disease after a tick bite. Symptoms of a tick-borne disease can occur from moments after the tick bites to 30 days after a tick is removed. Symptoms include: Fever or chills. A red rash that makes a circle (bull's-eye rash) in the bite area. Redness and swelling in the bite area. Headache or stiff neck. Muscle, joint, or bone pain. Abnormal tiredness. Numbness in your legs or trouble walking or moving your legs. Tender or swollen lymph glands. Abdominal pain, vomiting, diarrhea, or weight loss. Get help right away if: You are not able to remove a tick. You have muscle weakness or paralysis. Your symptoms get worse or you experience new symptoms. You find an engorged tick on your skin and you are in an area where there is a higher risk of disease from ticks. Summary Ticks may carry germs that can spread to a  person through a bite. These germs can cause disease. Wear protective clothing and use insect repellent to prevent tick bites. Follow the instructions on the label. If you find a tick on your body, remove it as soon as possible. If the tick is attached, do not try to remove it with heat, alcohol, petroleum jelly, or fingernail polish. If you have symptoms of a disease after being bitten by a tick, contact a health care provider. This information is not intended to replace advice given to you by your health care provider. Make sure you discuss any questions you have with your health care provider. Document Revised: 05/21/2021 Document Reviewed: 05/21/2021 Elsevier Patient Education  2024 [employer].

## 2022-08-23 NOTE — Telephone Encounter (Signed)
Pt coming in this Am for check on tick bite, sent a photo unsure if you wanted to look it over

## 2022-08-23 NOTE — Telephone Encounter (Signed)
Pt has appt

## 2022-08-23 NOTE — Progress Notes (Signed)
Subjective:  Patient ID: Shawn Ban., male    DOB: March 19, 1960  Age: 62 y.o. MRN: [id number]  CC:  Chief Complaint  Patient presents with   Tick Removal    Pt notes got tick off leg 3 days ago, thinks he got all of it out, large red spot on Lt leg, itching some,     HPI Shawn Ball. presents for   Tick bite Left leg, embedded tick on left leg 3 days ago. Likely there less than once day. Removed with tweezers. Came out in parts, feels like he removed all of it. Raw, blood speck appearance afterward. Applied some leftover mometasone cream few times - old rx.  Has noticed a red spot around area of tick today. see photo in my chart. Noticed this am.  No fever. Min nausea. No headache. No other rash.  Had R arm lesion frozen yesterday by dermatology. Doing ok.    History Patient Active Problem List   Diagnosis Date Noted   Acute viral conjunctivitis of right eye 08/28/2020   Routine general medical examination at a health care facility 05/01/2020   Hyperlipidemia LDL goal <70 03/15/2020   Hypertriglyceridemia 03/15/2020   Diabetes mellitus without complication (HCC)    Benign essential HTN 08/25/2019   Past Medical History:  Diagnosis Date   Allergy    Arthritis    Chronic bronchitis    Chronic pain    per pt/ right shoulder pain, left leg and ankle   CKD (chronic kidney disease) stage 3, GFR 30-59 ml/min (HCC)    Costochondral chest pain    due to torn tendons.   CTS (carpal tunnel syndrome)    right hand   Diabetes mellitus without complication (HCC)    prediabetes/ no meds   Diverticulitis    GERD (gastroesophageal reflux disease)    History of anal fissures    Hyperlipemia    Hypertension    Rectal bleeding    occasional   Seasonal allergies    Past Surgical History:  Procedure Laterality Date   ANKLE FRACTURE SURGERY  2005 / 2007   left ankle and  left leg/ have screws in leg and ankle   CARPAL TUNNEL RELEASE Left 07/29/2019   Procedure:  CARPAL TUNNEL RELEASE;  Surgeon: Cindee Salt, MD;  Location: [facility];  Service: Orthopedics;  Laterality: Left;  FOREARM BLOCK   EXAMINATION UNDER ANESTHESIA N/A 12/15/2012   Procedure: EXAM UNDER ANESTHESIA;  Surgeon: Atilano Ina, MD;  Location: [facility];  Service: General;  Laterality: N/A;   FRACTURE SURGERY  2005 and 2007   lt ankle x2   HEMORRHOIDECTOMY WITH HEMORRHOID BANDING N/A 12/15/2012   Procedure: EXCISIONAL HEMORRHOIDECTOMY WITH HEMORRHOID BANDING;  Surgeon: Atilano Ina, MD;  Location: [facility];  Service: General;  Laterality: N/A;   rotator cuff surgery      x 2/ right shoulder   TRIGGER FINGER RELEASE  07/23/2011   Procedure: Right hand- RELEASE TRIGGER FINGER/A-1 PULLEY;  Surgeon: Wyn Forster., MD;  Location:  S[facility]ERY [facility];  Service: Orthopedics;  Laterality: Right;   WRIST FRACTURE SURGERY  1984   lt with bone graft   WRIST SURGERY     left wrist   Allergies  Allergen Reactions   Amlodipine Other (See Comments)    Per pt - damage to kidney's   Codeine Nausea And Vomiting   Ivp Dye [Iodinated Contrast Media] Nausea Only  Nauseated   Penicillins Nausea And Vomiting    Patient states he has taken Amoxicillin without issue recently   Sulfa Antibiotics Nausea Only    nauseated   Prior to Admission medications   Medication Sig Start Date End Date Taking? Authorizing Provider  ASPIRIN 81 PO Take 1 tablet by mouth daily.   Yes [provider]  Biotin 5000 MCG CAPS 2 times a week 12/03/14  Yes [provider]  brompheniramine-pseudoephedrine-DM 30-2-10 MG/5ML syrup Take 5 mLs by mouth 4 (four) times daily as needed. 05/18/22  Yes Leath-Warren, Sadie Haber, NP  carvedilol (COREG) 6.25 MG tablet Take 1 tablet (6.25 mg total) by mouth 2 (two) times daily with a meal. 08/15/22  Yes Shade Flood, MD  cetirizine (ZYRTEC) 10 MG tablet Take 1 tablet (10 mg total) by mouth daily. 10/28/19   Yes Donita Brooks, MD  Collagen Hydrolysate, Bovine, POWD by Does not apply route. Takes Tour manager" for arthritis   Yes [provider]  FIBER COMPLETE PO Take by mouth. Vita Fusion fiber gummies   Yes [provider]  fluticasone (FLONASE) 50 MCG/ACT nasal spray Place 2 sprays into both nostrils daily. 04/27/18  Yes Donita Brooks, MD  Garlic Oil 1000 MG CAPS  10/02/20  Yes [provider]  Glucosamine-Chondroitin-MSM 500-200-150 MG TABS Take by mouth.   Yes [provider]  L-Lysine 1000 MG TABS Take by mouth.   Yes [provider]  L-Lysine 1000 MG TABS  03/04/12  Yes [provider]  Lancets (FREESTYLE) lancets Use as instructed 10/28/19  Yes Donita Brooks, MD  linagliptin (TRADJENTA) 5 MG TABS tablet Take 1 tablet (5 mg total) by mouth daily. 08/15/22  Yes Shade Flood, MD  losartan (COZAAR) 100 MG tablet Take 1 tablet by mouth daily.   Yes [provider]  MAGNESIUM OXIDE PO Take 375 mg by mouth daily.   Yes [provider]  mometasone (ELOCON) 0.1 % ointment Apply topically daily.   Yes [provider]  mometasone-formoterol (DULERA) 100-5 MCG/ACT AERO Inhale 2 puffs into the lungs 2 (two) times daily.   Yes [provider]  Multiple Vitamins-Minerals (VISION FORMULA EYE HEALTH PO)  06/02/17  Yes [provider]  omega-3 acid ethyl esters (LOVAZA) 1 g capsule Take 2 capsules by mouth twice daily 08/14/22  Yes Shade Flood, MD  omeprazole (PRILOSEC) 20 MG capsule Take 1 capsule (20 mg total) by mouth 2 (two) times daily before a meal. 08/15/22  Yes Shade Flood, MD  [facility] VERIO test strip USE 1 STRIP TO CHECK GLUCOSE IN THE MORNING AND AT BEDTIME 08/12/22  Yes Shade Flood, MD  simvastatin (ZOCOR) 20 MG tablet Take 1 tablet (20 mg total) by mouth at bedtime. 01/21/22  Yes Shade Flood, MD   Social History   Socioeconomic History   Marital status: Divorced    Spouse  name: Not on file   Number of children: 1   Years of education: Not on file   Highest education level: Some college, no degree  Occupational History   Occupation: disabled  Tobacco Use   Smoking status: Never   Smokeless tobacco: Former    Types: Chew   Tobacco comments:    Quit age 41 y.o  Vaping Use   Vaping Use: Never used  Substance and Sexual Activity   Alcohol use: No   Drug use: No   Sexual activity: Not Currently  Other Topics Concern  Not on file  Social History Narrative   Not on file   Social Determinants of Health   Financial Resource Strain: Low Risk  (06/10/2022)   Overall Financial Resource Strain (CARDIA)    Difficulty of Paying Living Expenses: Not hard at all  Food Insecurity: No Food Insecurity (06/10/2022)   Hunger Vital Sign    Worried About Running Out of Food in the Last Year: Never true    Ran Out of Food in the Last Year: Never true  Transportation Needs: No Transportation Needs (06/10/2022)   PRAPARE - Administrator, Civil Service (Medical): No    Lack of Transportation (Non-Medical): No  Physical Activity: Unknown (06/10/2022)   Exercise Vital Sign    Days of Exercise per Week: 0 days    Minutes of Exercise per Session: Not on file  Stress: No Stress Concern Present (06/10/2022)   [employer] of Occupational Health - Occupational Stress Questionnaire    Feeling of Stress : Not at all  Social Connections: Moderately Integrated (06/10/2022)   Social Connection and Isolation Panel [NHANES]    Frequency of Communication with Friends and Family: Twice a week    Frequency of Social Gatherings with Friends and Family: Twice a week    Attends Religious Services: More than 4 times per year    Active Member of [employer] or Organizations: Yes    Attends Engineer, structural: More than 4 times per year    Marital Status: Divorced  Catering manager Violence: Not on file    Review of Systems Per HPI.   Objective:   Vitals:    08/23/22 1152  BP: 128/70  Pulse: 85  Temp: 98 F (36.7 C)  TempSrc: Temporal  SpO2: 98%  Weight: 216 lb (98 kg)  Height: 5\' 11"  (1.803 m)     Physical Exam Vitals reviewed.  Constitutional:      General: He is not in acute distress.    Appearance: Normal appearance. He is well-developed.  HENT:     Head: Normocephalic and atraumatic.  Cardiovascular:     Rate and Rhythm: Normal rate.  Pulmonary:     Effort: Pulmonary effort is normal.  Skin:    Comments: Left thigh, rounded erythematous lesion surrounding central opening from prior tick bite.  No tick parts remaining.  No foreign bodies appreciated.  See photo.  Minimal induration.  Approximately 14 mm wide.  No other rash of skin appreciated.  Neurological:     Mental Status: He is alert and oriented to person, place, and time.  Psychiatric:        Mood and Affect: Mood normal.        Assessment & Plan:  Shawn Caba. is a 62 y.o. male . Rash and nonspecific skin eruption - Plan: doxycycline (VIBRA-TABS) 100 MG tablet, triamcinolone cream (KENALOG) 0.1 %  Tick bite of left thigh, initial encounter - Plan: doxycycline (VIBRA-TABS) 100 MG tablet, triamcinolone cream (KENALOG) 0.1 %  -Tick bite with possible abrasion/bruising from initial tick removal process versus cellulitis versus less likely early erythema migrans.  Minimal nausea but no other systemic symptoms and no other rash.  Treatment options discussed, will start doxycycline 100 mg twice daily for 10 days with potential side effects and risk discussed.  Symptomatic care with option of triamcinolone for itching.  RTC precautions.  Handout given on tick bites, preventative techniques discussed.  Meds ordered this encounter  Medications   doxycycline (VIBRA-TABS) 100 MG tablet  Sig: Take 1 tablet (100 mg total) by mouth 2 (two) times daily.    Dispense:  20 tablet    Refill:  0   triamcinolone cream (KENALOG) 0.1 %    Sig: Apply 1 Application topically  2 (two) times daily.    Dispense:  30 g    Refill:  0   Patient Instructions  Area around tick bite could be some redness or bruising from the initial injury and removing the tick, but I think it is reasonable to start antibiotics to cover for possible tickborne illness or less likely early erythema migrans (bull's-eye rash seen with Lyme disease).  Again this is unlikely but can try antibiotic initially.  If you are unable to tolerate the antibiotic, let me know and we may try coming off of it.  Make sure to avoid the sun or use more sunblock on the antibiotic as it does make you more prone to sunburn.  Can take it with food if needed.   Triamcinolone steroid cream can be used for itching if needed at this area or other bites or stings.  Follow-up if that is needed persistently.  If any new or worsening symptoms be seen.  Take care!  Tick Bite Information, Adult  Ticks are insects that draw blood for food. They climb onto people and animals that brush against the leaves and grasses that they live in. They then bite and attach to the skin. Most ticks are harmless, but some ticks may carry germs that can cause disease. These germs are spread to a person through a bite. To lower your risk of getting a disease from a tick bite, make sure you: Take steps to prevent tick bites. Check for ticks after being outdoors where ticks live. Watch for symptoms of disease if a tick attached to you or if you think a tick bit you. How can I prevent tick bites? Take these steps to help prevent tick bites when you go outdoors in an area where ticks live: Before you go outdoors: Wear long sleeves and long pants to protect your skin from ticks. Wear light-colored clothing so you can see ticks easier. Tuck your pant legs into your socks. Apply insect repellent that has DEET (20% or higher), picaridin, or IR3535 in it to the following areas: Any bare skin. Avoid areas around the eyes and mouth. Edges of clothing,  like the top of your boots, the bottom of your pant legs, and your sleeve cuffs. Consider applying an insect repellant that contains permethrin. Follow the instructions on the label. Do not apply permethrin directly to the skin. Instead, apply to the following areas: Clothing and shoes. Outdoor gear and tents. When you are outdoors: Avoid walking through areas with long grass. If you are walking on a trail, stay in the middle of the trail so your skin, hair, and clothing do not touch the bushes. Check for ticks on your clothing, hair, and skin often while you are outdoors. Check again before you go inside. When you go indoors: Check your clothing for ticks. Tumble dry clothes in a dryer on high heat for at least 10 minutes. If clothes are damp, additional time may be needed. If clothes require washing, use hot water. Check your gear and pets. Shower soon after being outdoors. Check your body for ticks. Do a full body check using a mirror. Be sure to check your scalp, neck, armpits, waist, groin, and joint areas. These are the spots where ticks attach themselves most  often. What is the best way to remove a tick?  Remove the tick as soon as possible. Removing it can prevent germs from passing to your body. Do not remove the tick with your bare fingers. Do not try to remove a tick with heat, alcohol, petroleum jelly, or fingernail polish. These things can cause the tick to salivate and regurgitate into your bloodstream, increasing your risk of getting a disease. To remove a tick that is crawling on your skin: Go outside and brush the tick off. Use tape or a lint roller. To remove a tick that is attached to your skin: Wash your hands. If you have gloves, put them on. Use a fine-tipped tweezer, curved forceps, or a tick-removal tool to gently grasp the tick as close to your skin and the tick's head as possible. Gently pull with a steady, upward, and even pressure until the tick lets go. While  removing the tick: Take care to keep the tick's head attached to its body. Do not twist or jerk the tick. This can make the tick's head or mouth parts break off and stay in your skin. If this happens, try to remove the mouth parts with tweezers. If you cannot remove them, leave the area alone and let the skin heal. Do not squeeze or crush the tick's body. This could force disease-carrying fluids from the tick into your body. What should I do after removing a tick? Clean the bite area and your hands with soap and water, rubbing alcohol, or an iodine scrub. If an antiseptic cream or ointment is available, put a small amount on the bite area. Wash and disinfect any tools that you used to remove the tick. How should I dispose of a tick? To dispose of a live tick, use one of these methods: Place it in rubbing alcohol. Place it in a sealed bag or container, and throw it away. Wrap it tightly in tape, and throw it away. Flush it down the toilet. Where to find more information Centers for Disease Control and Prevention: GyrateAtrophy.si U.S. Environmental Protection Agency: RelocationNetworking.fi Contact a health care provider if: You have symptoms of a disease after a tick bite. Symptoms of a tick-borne disease can occur from moments after the tick bites to 30 days after a tick is removed. Symptoms include: Fever or chills. A red rash that makes a circle (bull's-eye rash) in the bite area. Redness and swelling in the bite area. Headache or stiff neck. Muscle, joint, or bone pain. Abnormal tiredness. Numbness in your legs or trouble walking or moving your legs. Tender or swollen lymph glands. Abdominal pain, vomiting, diarrhea, or weight loss. Get help right away if: You are not able to remove a tick. You have muscle weakness or paralysis. Your symptoms get worse or you experience new symptoms. You find an engorged tick on your skin and you are in an area where there is a higher risk of  disease from ticks. Summary Ticks may carry germs that can spread to a person through a bite. These germs can cause disease. Wear protective clothing and use insect repellent to prevent tick bites. Follow the instructions on the label. If you find a tick on your body, remove it as soon as possible. If the tick is attached, do not try to remove it with heat, alcohol, petroleum jelly, or fingernail polish. If you have symptoms of a disease after being bitten by a tick, contact a health care provider. This information is not intended to replace  advice given to you by your health care provider. Make sure you discuss any questions you have with your health care provider. Document Revised: 05/21/2021 Document Reviewed: 05/21/2021 Elsevier Patient Education  2024 Elsevier Inc.     Signed,   Meredith Staggers, MD [facility], [facility] 08/23/22 12:34 PM

## 2022-08-23 NOTE — Telephone Encounter (Signed)
Noted - will eval at visit this am

## 2022-09-14 ENCOUNTER — Other Ambulatory Visit: Payer: Self-pay | Admitting: Family Medicine

## 2022-09-14 DIAGNOSIS — E781 Pure hyperglyceridemia: Secondary | ICD-10-CM

## 2022-09-16 ENCOUNTER — Other Ambulatory Visit: Payer: Self-pay | Admitting: Family Medicine

## 2022-09-16 DIAGNOSIS — E781 Pure hyperglyceridemia: Secondary | ICD-10-CM

## 2022-09-20 DIAGNOSIS — M19279 Secondary osteoarthritis, unspecified ankle and foot: Secondary | ICD-10-CM | POA: Insufficient documentation

## 2022-09-29 ENCOUNTER — Other Ambulatory Visit: Payer: Self-pay | Admitting: Family Medicine

## 2022-09-29 DIAGNOSIS — E782 Mixed hyperlipidemia: Secondary | ICD-10-CM

## 2022-09-30 ENCOUNTER — Other Ambulatory Visit: Payer: Self-pay | Admitting: Family Medicine

## 2022-09-30 DIAGNOSIS — E1122 Type 2 diabetes mellitus with diabetic chronic kidney disease: Secondary | ICD-10-CM

## 2022-10-13 ENCOUNTER — Other Ambulatory Visit: Payer: Self-pay | Admitting: Family Medicine

## 2022-10-13 DIAGNOSIS — E781 Pure hyperglyceridemia: Secondary | ICD-10-CM

## 2022-10-17 ENCOUNTER — Encounter (INDEPENDENT_AMBULATORY_CARE_PROVIDER_SITE_OTHER): Payer: Self-pay

## 2022-11-12 ENCOUNTER — Other Ambulatory Visit: Payer: Self-pay | Admitting: Family Medicine

## 2022-11-12 DIAGNOSIS — E781 Pure hyperglyceridemia: Secondary | ICD-10-CM

## 2022-11-19 IMAGING — DX DG SHOULDER 2+V*L*
3 series · 3 of 3 positions shown · non-contrast
Comparison: None.

CLINICAL DATA: Left shoulder pain/popping for 1 month

EXAM:
LEFT SHOULDER - 2+ VIEW

[grashey]
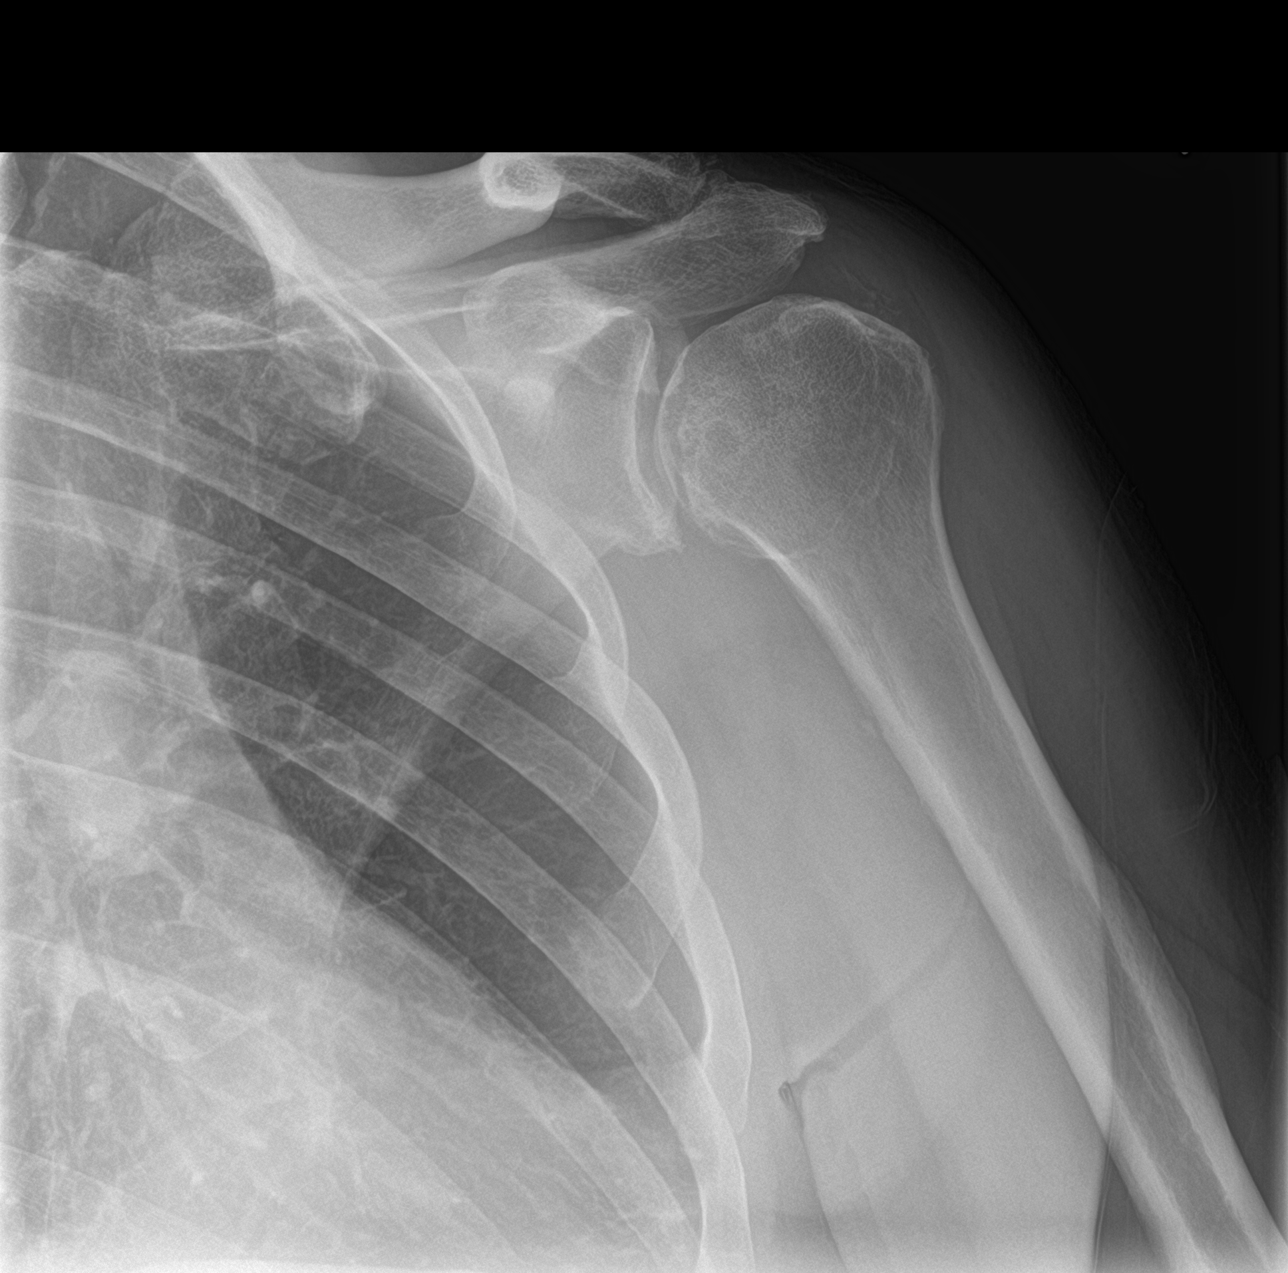

[y view]
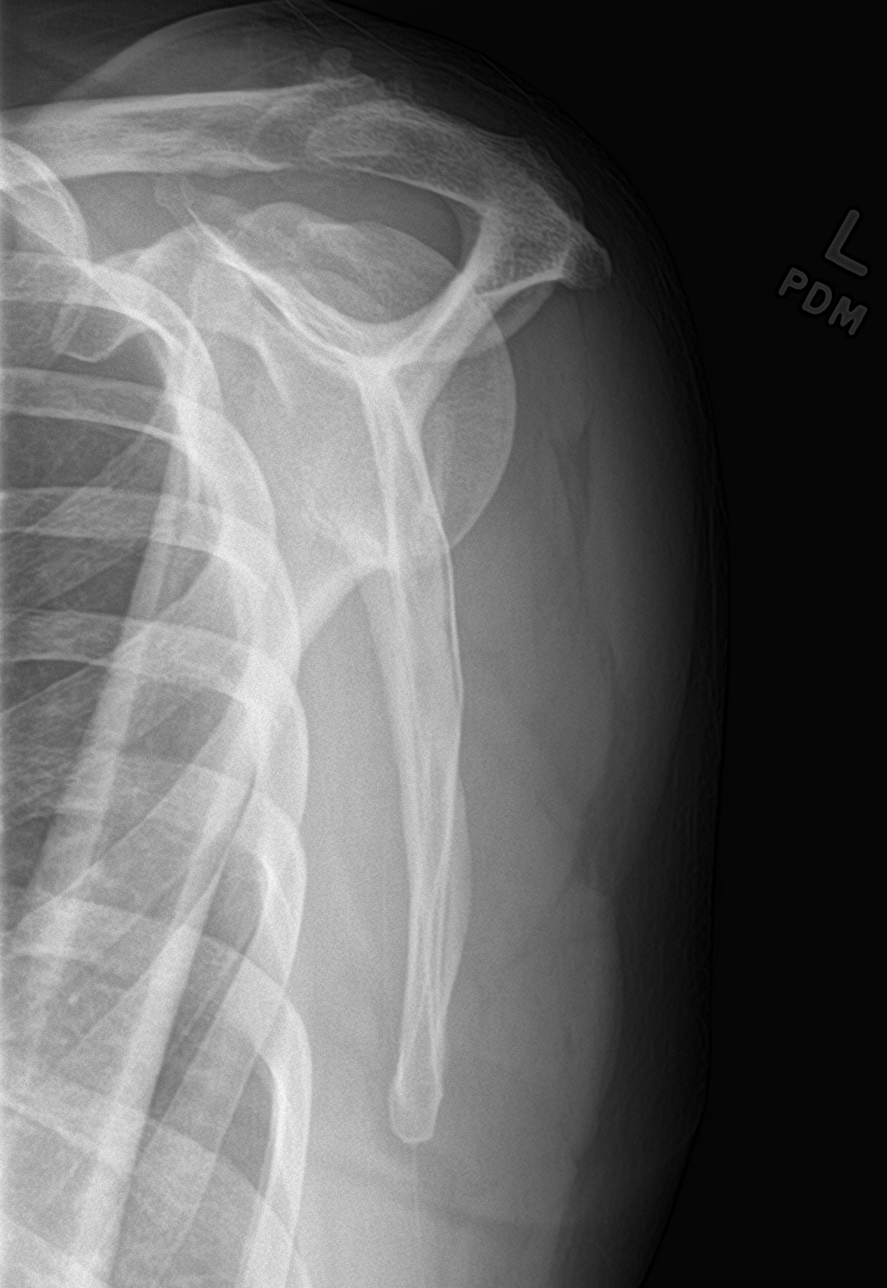

[shoulder axial]
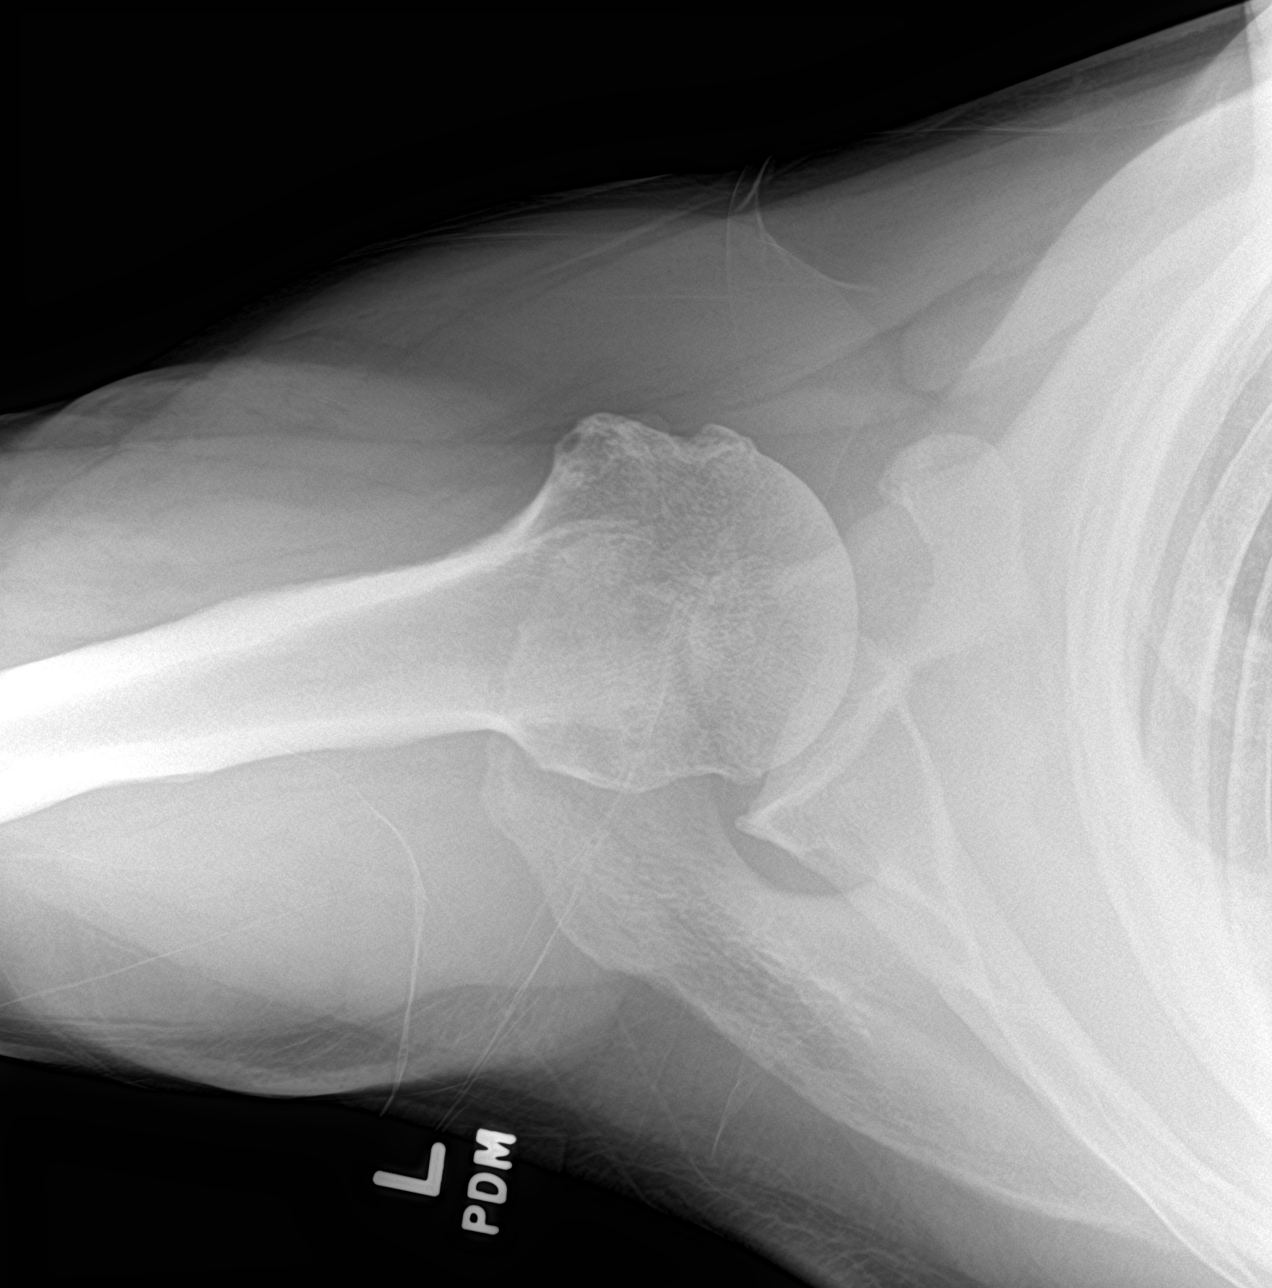

[3 of 3 positions shown; findings below may reference images not displayed]

FINDINGS: There is no acute fracture or dislocation. Glenohumeral and
acromioclavicular alignment is maintained. There is mild
glenohumeral and moderate acromioclavicular joint space narrowing
with associated subchondral sclerosis and osteophytosis, consistent
with osteoarthritis. The soft tissues are unremarkable.
IMPRESSION: 1. No acute fracture or dislocation.
2. Degenerative changes as above, worse at the acromioclavicular
joint.

## 2022-11-20 ENCOUNTER — Encounter: Payer: Self-pay | Admitting: Family Medicine

## 2022-11-20 ENCOUNTER — Ambulatory Visit: Payer: 59 | Admitting: Family Medicine

## 2022-11-20 VITALS — BP 122/86 | HR 70 | Temp 97.9°F | Wt 210.8 lb

## 2022-11-20 DIAGNOSIS — E781 Pure hyperglyceridemia: Secondary | ICD-10-CM

## 2022-11-20 DIAGNOSIS — N182 Chronic kidney disease, stage 2 (mild): Secondary | ICD-10-CM | POA: Diagnosis not present

## 2022-11-20 DIAGNOSIS — E782 Mixed hyperlipidemia: Secondary | ICD-10-CM | POA: Diagnosis not present

## 2022-11-20 DIAGNOSIS — Z794 Long term (current) use of insulin: Secondary | ICD-10-CM

## 2022-11-20 DIAGNOSIS — I10 Essential (primary) hypertension: Secondary | ICD-10-CM

## 2022-11-20 DIAGNOSIS — E1122 Type 2 diabetes mellitus with diabetic chronic kidney disease: Secondary | ICD-10-CM | POA: Diagnosis not present

## 2022-11-20 DIAGNOSIS — R351 Nocturia: Secondary | ICD-10-CM

## 2022-11-20 LAB — COMPREHENSIVE METABOLIC PANEL WITH GFR
ALT: 29 U/L (ref 0–53)
AST: 24 U/L (ref 0–37)
Albumin: 4.4 g/dL (ref 3.5–5.2)
Alkaline Phosphatase: 59 U/L (ref 39–117)
BUN: 19 mg/dL (ref 6–23)
CO2: 28 meq/L (ref 19–32)
Calcium: 10.1 mg/dL (ref 8.4–10.5)
Chloride: 104 meq/L (ref 96–112)
Creatinine, Ser: 1.22 mg/dL (ref 0.40–1.50)
GFR: 64.29 mL/min (ref >60.00)
Glucose, Bld: 141 mg/dL — ABNORMAL HIGH (ref 70–99)
Potassium: 3.9 meq/L (ref 3.5–5.1)
Sodium: 139 meq/L (ref 135–145)
Total Bilirubin: 0.6 mg/dL (ref 0.2–1.2)
Total Protein: 7.2 g/dL (ref 6.0–8.3)

## 2022-11-20 LAB — LIPID PANEL
Cholesterol: 146 mg/dL (ref 0–200)
HDL: 32.8 mg/dL — ABNORMAL LOW (ref >39.00)
LDL Cholesterol: 89 mg/dL (ref 0–99)
NonHDL: 113.36
Total CHOL/HDL Ratio: 4
Triglycerides: 122 mg/dL (ref 0.0–149.0)
VLDL: 24.4 mg/dL (ref 0.0–40.0)

## 2022-11-20 LAB — HEMOGLOBIN A1C: Hgb A1c MFr Bld: 6.4 % (ref 4.6–6.5)

## 2022-11-20 MED ORDER — SIMVASTATIN 20 MG PO TABS
20.0000 mg | ORAL_TABLET | Freq: Every day | ORAL | 2 refills | Status: DC
Start: 2022-11-20 — End: 2023-05-22

## 2022-11-20 MED ORDER — CARVEDILOL 6.25 MG PO TABS
6.2500 mg | ORAL_TABLET | Freq: Two times a day (BID) | ORAL | 2 refills | Status: DC
Start: 2022-11-20 — End: 2023-05-22

## 2022-11-20 MED ORDER — LINAGLIPTIN 5 MG PO TABS
5.0000 mg | ORAL_TABLET | Freq: Every day | ORAL | 2 refills | Status: DC
Start: 2022-11-20 — End: 2023-05-22

## 2022-11-20 MED ORDER — ONETOUCH VERIO VI STRP
ORAL_STRIP | 3 refills | Status: DC
Start: 2022-11-20 — End: 2023-05-30

## 2022-11-20 MED ORDER — LOSARTAN POTASSIUM 100 MG PO TABS
100.0000 mg | ORAL_TABLET | Freq: Every day | ORAL | 2 refills | Status: DC
Start: 2022-11-20 — End: 2023-05-22

## 2022-11-20 MED ORDER — OMEGA-3-ACID ETHYL ESTERS 1 G PO CAPS
2.0000 | ORAL_CAPSULE | Freq: Two times a day (BID) | ORAL | 3 refills | Status: DC
Start: 2022-11-20 — End: 2023-04-07

## 2022-11-20 NOTE — Progress Notes (Signed)
Subjective:  Patient ID: Shawn Ban., male    DOB: May 21, 1960  Age: 62 y.o. MRN: [id number]  CC:  Chief Complaint  Patient presents with   Medical Management of Chronic Issues    States sugar is up and down     HPI Shawn Ban. presents for   Diabetes: Complicated by microalbuminuria, CKD, hyperglycemia, treated with Tradjenta, and is on statin and ACE inhibitor.  Takes tradjenta daily, but then extra 1/2 pill at night if elevated readings. 1-2 x/week based on diet.  Home readings fasting: 120-180, depending on diet.  Postprandial: 170 range.  Symptomatic lows: none.  Microalbumin: 11.1 on 05/08/2022 Optho, foot exam, pneumovax: Up-to-date  Lab Results  Component Value Date   HGBA1C 6.2 08/15/2022   HGBA1C 6.4 05/08/2022   HGBA1C 6.7 (H) 01/21/2022   Lab Results  Component Value Date   MICROALBUR 11.1 (H) 05/08/2022   LDLCALC 73 05/08/2022   CREATININE 1.17 08/15/2022   Hypertension: On coreg, losartan. No new side effects.  Home readings: 120/80 range.  BP Readings from Last 3 Encounters:  11/20/22 122/86  08/23/22 128/70  08/15/22 118/76   Lab Results  Component Value Date   CREATININE 1.17 08/15/2022    Hyperlipidemia: Simvastatin 20mg  every day, lovaza 2 caps BID. Fasting today.  Lab Results  Component Value Date   CHOL 130 05/08/2022   HDL 32.50 (L) 05/08/2022   LDLCALC 73 05/08/2022   LDLDIRECT 75.0 06/25/2021   TRIG 119.0 05/08/2022   CHOLHDL 4 05/08/2022   Lab Results  Component Value Date   ALT 20 05/08/2022   AST 17 05/08/2022   ALKPHOS 60 05/08/2022   BILITOT 0.6 05/08/2022      Nocturia Discussed that his April and June visit.  Some component of insomnia thought to be due to nocturia.  PSA was reassuring, decreased fluid intake prior to bedtime recommended, but some persistent symptoms with that change.  Side effects with trial of saw palmetto.  Possible BPH, referred to urology in June.  Referral notes indicated in  August that referral was placed and waiting on patient to call and schedule appointment. Has appt next week.   HM: had flu and covid vaccine 9/6 [facility]  History Patient Active Problem List   Diagnosis Date Noted   Acute viral conjunctivitis of right eye 08/28/2020   Routine general medical examination at a health care facility 05/01/2020   Hyperlipidemia LDL goal <70 03/15/2020   Hypertriglyceridemia 03/15/2020   Diabetes mellitus without complication (HCC)    Benign essential HTN 08/25/2019   Past Medical History:  Diagnosis Date   Allergy    Arthritis    Chronic bronchitis    Chronic pain    per pt/ right shoulder pain, left leg and ankle   CKD (chronic kidney disease) stage 3, GFR 30-59 ml/min (HCC)    Costochondral chest pain    due to torn tendons.   CTS (carpal tunnel syndrome)    right hand   Diabetes mellitus without complication (HCC)    prediabetes/ no meds   Diverticulitis    GERD (gastroesophageal reflux disease)    History of anal fissures    Hyperlipemia    Hypertension    Rectal bleeding    occasional   Seasonal allergies    Past Surgical History:  Procedure Laterality Date   ANKLE FRACTURE SURGERY  2005 / 2007   left ankle and  left leg/ have screws in leg and ankle  CARPAL TUNNEL RELEASE Left 07/29/2019   Procedure: CARPAL TUNNEL RELEASE;  Surgeon: Cindee Salt, MD;  Location: [facility];  Service: Orthopedics;  Laterality: Left;  FOREARM BLOCK   EXAMINATION UNDER ANESTHESIA N/A 12/15/2012   Procedure: EXAM UNDER ANESTHESIA;  Surgeon: Atilano Ina, MD;  Location: [facility];  Service: General;  Laterality: N/A;   FRACTURE SURGERY  2005 and 2007   lt ankle x2   HEMORRHOIDECTOMY WITH HEMORRHOID BANDING N/A 12/15/2012   Procedure: EXCISIONAL HEMORRHOIDECTOMY WITH HEMORRHOID BANDING;  Surgeon: Atilano Ina, MD;  Location: [facility];  Service: General;  Laterality: N/A;   rotator cuff  surgery      x 2/ right shoulder   TRIGGER FINGER RELEASE  07/23/2011   Procedure: Right hand- RELEASE TRIGGER FINGER/A-1 PULLEY;  Surgeon: Wyn Forster., MD;  Location: [facility];  Service: Orthopedics;  Laterality: Right;   WRIST FRACTURE SURGERY  1984   lt with bone graft   WRIST SURGERY     left wrist   Allergies  Allergen Reactions   Amlodipine Other (See Comments)    Per pt - damage to kidney's   Codeine Nausea And Vomiting   Ivp Dye [Iodinated Contrast Media] Nausea Only    Nauseated   Penicillins Nausea And Vomiting    Patient states he has taken Amoxicillin without issue recently   Sulfa Antibiotics Nausea Only    nauseated   Prior to Admission medications   Medication Sig Start Date End Date Taking? Authorizing Provider  ASPIRIN 81 PO Take 1 tablet by mouth daily.    [provider]  Biotin 5000 MCG CAPS 2 times a week 12/03/14   [provider]  brompheniramine-pseudoephedrine-DM 30-2-10 MG/5ML syrup Take 5 mLs by mouth 4 (four) times daily as needed. 05/18/22   Leath-Warren, Sadie Haber, NP  carvedilol (COREG) 6.25 MG tablet Take 1 tablet (6.25 mg total) by mouth 2 (two) times daily with a meal. 08/15/22   Shade Flood, MD  cetirizine (ZYRTEC) 10 MG tablet Take 1 tablet (10 mg total) by mouth daily. 10/28/19   Donita Brooks, MD  Collagen Hydrolysate, Bovine, POWD by Does not apply route. Takes Tour manager" for arthritis    [provider]  doxycycline (VIBRA-TABS) 100 MG tablet Take 1 tablet (100 mg total) by mouth 2 (two) times daily. 08/23/22   Shade Flood, MD  FIBER COMPLETE PO Take by mouth. Vita Fusion fiber gummies    [provider]  fluticasone (FLONASE) 50 MCG/ACT nasal spray Place 2 sprays into both nostrils daily. 04/27/18   Donita Brooks, MD  Garlic Oil 1000 MG CAPS  10/02/20   [provider]  Glucosamine-Chondroitin-MSM 500-200-150 MG TABS Take by mouth.    [provider]   L-Lysine 1000 MG TABS Take by mouth.    [provider]  L-Lysine 1000 MG TABS  03/04/12   [provider]  Lancets (FREESTYLE) lancets Use as instructed 10/28/19   Donita Brooks, MD  linagliptin (TRADJENTA) 5 MG TABS tablet Take 1 tablet (5 mg total) by mouth daily. 08/15/22   Shade Flood, MD  losartan (COZAAR) 100 MG tablet Take 1 tablet by mouth once daily 10/14/22   Shade Flood, MD  MAGNESIUM OXIDE PO Take 375 mg by mouth daily.    [provider]  mometasone-formoterol (DULERA) 100-5 MCG/ACT AERO Inhale 2 puffs into the lungs 2 (two) times daily.  [provider]  Multiple Vitamins-Minerals (VISION FORMULA EYE HEALTH PO)  06/02/17   [provider]  omega-3 acid ethyl esters (LOVAZA) 1 g capsule Take 2 capsules by mouth twice daily 11/12/22   Shade Flood, MD  omeprazole (PRILOSEC) 20 MG capsule Take 1 capsule (20 mg total) by mouth 2 (two) times daily before a meal. 08/15/22   Shade Flood, MD  [facility] VERIO test strip USE 1 STRIP TO CHECK GLUCOSE IN THE MORNING AND AT BEDTIME 09/30/22   Shade Flood, MD  simvastatin (ZOCOR) 20 MG tablet TAKE 1 TABLET BY MOUTH AT BEDTIME 09/30/22   Shade Flood, MD  triamcinolone cream (KENALOG) 0.1 % Apply 1 Application topically 2 (two) times daily. 08/23/22   Shade Flood, MD   Social History   Socioeconomic History   Marital status: Divorced    Spouse name: Not on file   Number of children: 1   Years of education: Not on file   Highest education level: Some college, no degree  Occupational History   Occupation: disabled  Tobacco Use   Smoking status: Never   Smokeless tobacco: Former    Types: Chew   Tobacco comments:    Quit age 54 y.o  Vaping Use   Vaping status: Never Used  Substance and Sexual Activity   Alcohol use: No   Drug use: No   Sexual activity: Not Currently  Other Topics Concern   Not on file  Social History Narrative   Not on file   Social  Determinants of Health   Financial Resource Strain: Low Risk  (06/10/2022)   Overall Financial Resource Strain (CARDIA)    Difficulty of Paying Living Expenses: Not hard at all  Food Insecurity: No Food Insecurity (06/10/2022)   Hunger Vital Sign    Worried About Running Out of Food in the Last Year: Never true    Ran Out of Food in the Last Year: Never true  Transportation Needs: No Transportation Needs (06/10/2022)   PRAPARE - Administrator, Civil Service (Medical): No    Lack of Transportation (Non-Medical): No  Physical Activity: Unknown (06/10/2022)   Exercise Vital Sign    Days of Exercise per Week: 0 days    Minutes of Exercise per Session: Not on file  Stress: No Stress Concern Present (06/10/2022)   [employer] of Occupational Health - Occupational Stress Questionnaire    Feeling of Stress : Not at all  Social Connections: Moderately Integrated (06/10/2022)   Social Connection and Isolation Panel [NHANES]    Frequency of Communication with Friends and Family: Twice a week    Frequency of Social Gatherings with Friends and Family: Twice a week    Attends Religious Services: More than 4 times per year    Active Member of [employer] or Organizations: Yes    Attends Engineer, structural: More than 4 times per year    Marital Status: Divorced  Catering manager Violence: Not on file    Review of Systems  Constitutional:  Negative for fatigue and unexpected weight change.  Eyes:  Negative for visual disturbance.  Respiratory:  Negative for cough, chest tightness and shortness of breath.   Cardiovascular:  Negative for chest pain, palpitations and leg swelling.  Gastrointestinal:  Negative for abdominal pain and blood in stool.  Neurological:  Negative for dizziness, light-headedness and headaches.     Objective:   Vitals:   11/20/22 0808  BP: 122/86  Pulse: 70  Temp: 97.9 F (36.6 C)  TempSrc: Temporal  SpO2: 99%  Weight: 210 lb 12.8 oz (95.6 kg)      Physical Exam Vitals reviewed.  Constitutional:      Appearance: He is well-developed.  HENT:     Head: Normocephalic and atraumatic.  Neck:     Vascular: No carotid bruit or JVD.  Cardiovascular:     Rate and Rhythm: Normal rate and regular rhythm.     Heart sounds: Normal heart sounds. No murmur heard. Pulmonary:     Effort: Pulmonary effort is normal.     Breath sounds: Normal breath sounds. No rales.  Musculoskeletal:     Right lower leg: No edema.     Left lower leg: No edema.  Skin:    General: Skin is warm and dry.  Neurological:     Mental Status: He is alert and oriented to person, place, and time.  Psychiatric:        Mood and Affect: Mood normal.        Assessment & Plan:  Shawn Honan. is a 62 y.o. male . Type 2 diabetes mellitus with stage 2 chronic kidney disease, without long-term current use of insulin (HCC) - Plan: linagliptin (TRADJENTA) 5 MG TABS tablet, glucose blood (ONETOUCH VERIO) test strip, Comprehensive metabolic panel, Lipid panel, Hemoglobin A1c  -Previously well-controlled, check A1c, advised against additional doses of Tradjenta, but can adjust meds if elevated readings.  Watch diet.  Nocturia  -Follow-up planned with urology soon  Benign essential HTN - Plan: carvedilol (COREG) 6.25 MG tablet, losartan (COZAAR) 100 MG tablet  -Stable, continue same dose losartan and carvedilol, check labs.  Adjust plan accordingly.  Mixed hyperlipidemia - Plan: simvastatin (ZOCOR) 20 MG tablet, Comprehensive metabolic panel, Lipid panel Hypertriglyceridemia - Plan: omega-3 acid ethyl esters (LOVAZA) 1 g capsule, Comprehensive metabolic panel, Lipid panel  -Tolerating current med regimen, continue same.  Check labs with adjustment of plan accordingly.  Meds ordered this encounter  Medications   carvedilol (COREG) 6.25 MG tablet    Sig: Take 1 tablet (6.25 mg total) by mouth 2 (two) times daily with a meal.    Dispense:  180 tablet     Refill:  2   linagliptin (TRADJENTA) 5 MG TABS tablet    Sig: Take 1 tablet (5 mg total) by mouth daily.    Dispense:  90 tablet    Refill:  2   losartan (COZAAR) 100 MG tablet    Sig: Take 1 tablet (100 mg total) by mouth daily.    Dispense:  90 tablet    Refill:  2   glucose blood (ONETOUCH VERIO) test strip    Sig: USE 1 STRIP TO CHECK GLUCOSE IN THE MORNING AND AT BEDTIME    Dispense:  100 each    Refill:  3   simvastatin (ZOCOR) 20 MG tablet    Sig: Take 1 tablet (20 mg total) by mouth at bedtime.    Dispense:  90 tablet    Refill:  2   omega-3 acid ethyl esters (LOVAZA) 1 g capsule    Sig: Take 2 capsules (2 g total) by mouth 2 (two) times daily.    Dispense:  120 capsule    Refill:  3   Patient Instructions  Thank you for coming in today.  I will check your labs and we can adjust medications if needed.  I do not recommend any additional doses of Tradjenta as dosing is 5 mg once per  day.  If you continue to have elevated readings we certainly can look at other options.  Let me know.  I would like to see the A1c reading first.  Diabetes has been overall well-controlled previously.  No other medication changes at this time and I will let you know if there are any concerns on labs.  Take care.    Signed,   Meredith Staggers, MD [facility], [facility] 11/20/22 8:30 AM

## 2022-11-20 NOTE — Patient Instructions (Signed)
Thank you for coming in today.  I will check your labs and we can adjust medications if needed.  I do not recommend any additional doses of Tradjenta as dosing is 5 mg once per day.  If you continue to have elevated readings we certainly can look at other options.  Let me know.  I would like to see the A1c reading first.  Diabetes has been overall well-controlled previously.  No other medication changes at this time and I will let you know if there are any concerns on labs.  Take care.

## 2023-01-25 ENCOUNTER — Ambulatory Visit
Admission: RE | Admit: 2023-01-25 | Discharge: 2023-01-25 | Disposition: A | Discharge status: Home / Self Care | Payer: 59 | Source: Ambulatory Visit | Attending: Nurse Practitioner | Admitting: Nurse Practitioner

## 2023-01-25 VITALS — BP 120/77 | HR 111 | Temp 98.1°F | Resp 20

## 2023-01-25 DIAGNOSIS — B349 Viral infection, unspecified: Secondary | ICD-10-CM

## 2023-01-25 LAB — POC COVID19/FLU A&B COMBO
Covid Antigen, POC: NEGATIVE
Influenza A Antigen, POC: NEGATIVE
Influenza B Antigen, POC: NEGATIVE

## 2023-01-25 NOTE — ED Triage Notes (Signed)
Pt reports cold chills, sweats, headaches, dizzy and fatigue x 1 day

## 2023-01-25 NOTE — Discharge Instructions (Addendum)
The COVID/influenza test was negative.  This most likely is a viral illness. Increase fluids and allow for plenty of rest.  Try to drink at least 8-10 8 ounce glasses of water while symptoms persist. May take over-the-counter Tylenol as needed for pain, fever, or general discomfort. May use normal saline nasal spray throughout the day for nasal congestion. If you develop a cough, recommend using a humidifier in your bedroom at nighttime during sleep and sleeping slightly elevated on pillows while symptoms persist. You should remain home until you have been fever free for 24 hours with no medication.  Also recommend staying away from others during this time. You may begin to feel worse over the next 12 to 24 hours, which is expected.  If your symptoms continue to persist or worsen over the next 5 to 7 days, or if you have other concerns, you may follow-up in this clinic or with your primary care physician for further evaluation. Follow-up as needed.

## 2023-01-25 NOTE — ED Provider Notes (Signed)
[facility]    CSN: [id number] Arrival date & time: 01/25/23  0865      History   Chief Complaint Chief Complaint  Patient presents with   Chills    HPI Shawn Ball. is a 62 y.o. male.   The history is provided by the patient.   Patient presents for complaints of bodyaches, chills, sweats, headaches, dizziness, and fatigue that started over the past 24 hours.  Patient states when he woke up this morning he had a fever, Tmax 101.5.  He denies sore throat, ear pain, difficulty breathing, chest pain, abdominal pain, nausea, vomiting, or diarrhea.  Patient reports he is not taking any medication for his symptoms.  Reports that he was around his family out hunting last evening who was coughing.  Reports prior history of COVID. Past Medical History:  Diagnosis Date   Allergy    Arthritis    Chronic bronchitis    Chronic pain    per pt/ right shoulder pain, left leg and ankle   CKD (chronic kidney disease) stage 3, GFR 30-59 ml/min (HCC)    Costochondral chest pain    due to torn tendons.   CTS (carpal tunnel syndrome)    right hand   Diabetes mellitus without complication (HCC)    prediabetes/ no meds   Diverticulitis    GERD (gastroesophageal reflux disease)    History of anal fissures    Hyperlipemia    Hypertension    Rectal bleeding    occasional   Seasonal allergies     Patient Active Problem List   Diagnosis Date Noted   Acute viral conjunctivitis of right eye 08/28/2020   Routine general medical examination at a health care facility 05/01/2020   Hyperlipidemia LDL goal <70 03/15/2020   Hypertriglyceridemia 03/15/2020   Diabetes mellitus without complication (HCC)    Benign essential HTN 08/25/2019    Past Surgical History:  Procedure Laterality Date   ANKLE FRACTURE SURGERY  2005 / 2007   left ankle and  left leg/ have screws in leg and ankle   CARPAL TUNNEL RELEASE Left 07/29/2019   Procedure: CARPAL TUNNEL RELEASE;  Surgeon: Cindee Salt, MD;  Location: [facility];  Service: Orthopedics;  Laterality: Left;  FOREARM BLOCK   EXAMINATION UNDER ANESTHESIA N/A 12/15/2012   Procedure: EXAM UNDER ANESTHESIA;  Surgeon: Atilano Ina, MD;  Location: [facility];  Service: General;  Laterality: N/A;   FRACTURE SURGERY  2005 and 2007   lt ankle x2   HEMORRHOIDECTOMY WITH HEMORRHOID BANDING N/A 12/15/2012   Procedure: EXCISIONAL HEMORRHOIDECTOMY WITH HEMORRHOID BANDING;  Surgeon: Atilano Ina, MD;  Location: [facility];  Service: General;  Laterality: N/A;   rotator cuff surgery      x 2/ right shoulder   TRIGGER FINGER RELEASE  07/23/2011   Procedure: Right hand- RELEASE TRIGGER FINGER/A-1 PULLEY;  Surgeon: Wyn Forster., MD;  Location: [facility];  Service: Orthopedics;  Laterality: Right;   WRIST FRACTURE SURGERY  1984   lt with bone graft   WRIST SURGERY     left wrist       Home Medications    Prior to Admission medications   Medication Sig Start Date End Date Taking? Authorizing Provider  ASPIRIN 81 PO Take 1 tablet by mouth daily.    [provider]  Biotin 5000 MCG CAPS 2 times a week 12/03/14   [provider]  brompheniramine-pseudoephedrine-DM 30-2-10  MG/5ML syrup Take 5 mLs by mouth 4 (four) times daily as needed. 05/18/22   Leath-Warren, Sadie Haber, NP  carvedilol (COREG) 6.25 MG tablet Take 1 tablet (6.25 mg total) by mouth 2 (two) times daily with a meal. 11/20/22   Shade Flood, MD  cetirizine (ZYRTEC) 10 MG tablet Take 1 tablet (10 mg total) by mouth daily. 10/28/19   Donita Brooks, MD  Collagen Hydrolysate, Bovine, POWD by Does not apply route. Takes Tour manager" for arthritis    [provider]  FIBER COMPLETE PO Take by mouth. Vita Fusion fiber gummies    [provider]  fluticasone (FLONASE) 50 MCG/ACT nasal spray Place 2 sprays into both nostrils daily. 04/27/18   Donita Brooks, MD  Garlic Oil 1000  MG CAPS  10/02/20   [provider]  Glucosamine-Chondroitin-MSM 500-200-150 MG TABS Take by mouth.    [provider]  glucose blood (ONETOUCH VERIO) test strip USE 1 STRIP TO CHECK GLUCOSE IN THE MORNING AND AT BEDTIME 11/20/22   Shade Flood, MD  L-Lysine 1000 MG TABS Take by mouth.    [provider]  L-Lysine 1000 MG TABS  03/04/12   [provider]  Lancets (FREESTYLE) lancets Use as instructed 10/28/19   Donita Brooks, MD  linagliptin (TRADJENTA) 5 MG TABS tablet Take 1 tablet (5 mg total) by mouth daily. 11/20/22   Shade Flood, MD  losartan (COZAAR) 100 MG tablet Take 1 tablet (100 mg total) by mouth daily. 11/20/22   Shade Flood, MD  MAGNESIUM OXIDE PO Take 375 mg by mouth daily.    [provider]  mometasone-formoterol (DULERA) 100-5 MCG/ACT AERO Inhale 2 puffs into the lungs 2 (two) times daily.    [provider]  Multiple Vitamins-Minerals (VISION FORMULA EYE HEALTH PO)  06/02/17   [provider]  omega-3 acid ethyl esters (LOVAZA) 1 g capsule Take 2 capsules (2 g total) by mouth 2 (two) times daily. 11/20/22   Shade Flood, MD  omeprazole (PRILOSEC) 20 MG capsule Take 1 capsule (20 mg total) by mouth 2 (two) times daily before a meal. 08/15/22   Shade Flood, MD  simvastatin (ZOCOR) 20 MG tablet Take 1 tablet (20 mg total) by mouth at bedtime. 11/20/22   Shade Flood, MD  triamcinolone cream (KENALOG) 0.1 % Apply 1 Application topically 2 (two) times daily. 08/23/22   Shade Flood, MD    Family History Family History  Problem Relation Age of Onset   Heart disease Mother    Hyperlipidemia Mother    Diabetes Mother    Macular degeneration Mother    Cancer Father        skin   Macular degeneration Sister    Diabetes Brother    Colon cancer Neg Hx    Stomach cancer Neg Hx    Esophageal cancer Neg Hx    Rectal cancer Neg Hx     Social History Social History   Tobacco Use    Smoking status: Never   Smokeless tobacco: Former    Types: Chew   Tobacco comments:    Quit age 74 y.o  Vaping Use   Vaping status: Never Used  Substance Use Topics   Alcohol use: No   Drug use: No     Allergies   Amlodipine, Codeine, Ivp dye [iodinated contrast media], Penicillins, and Sulfa antibiotics   Review of Systems Review of Systems Per HPI  Physical Exam Triage Vital  Signs ED Triage Vitals  Encounter Vitals Group     BP 01/25/23 0912 120/77     Systolic BP Percentile --      Diastolic BP Percentile --      Pulse Rate 01/25/23 0912 (!) 111     Resp 01/25/23 0912 20     Temp 01/25/23 0912 98.1 F (36.7 C)     Temp Source 01/25/23 0912 Oral     SpO2 01/25/23 0912 97 %     Weight --      Height --      Head Circumference --      Peak Flow --      Pain Score 01/25/23 0913 6     Pain Loc --      Pain Education --      Exclude from Growth Chart --    No data found.  Updated Vital Signs BP 120/77 (BP Location: Right Arm)   Pulse (!) 111   Temp 98.1 F (36.7 C) (Oral)   Resp 20   SpO2 97%   Visual Acuity Right Eye Distance:   Left Eye Distance:   Bilateral Distance:    Right Eye Near:   Left Eye Near:    Bilateral Near:     Physical Exam Vitals and nursing note reviewed.  Constitutional:      General: He is not in acute distress.    Appearance: Normal appearance.  HENT:     Head: Normocephalic.     Right Ear: Tympanic membrane, ear canal and external ear normal.     Left Ear: Tympanic membrane, ear canal and external ear normal.     Nose: Nose normal.     Mouth/Throat:     Mouth: Mucous membranes are moist.     Pharynx: No posterior oropharyngeal erythema.  Eyes:     Extraocular Movements: Extraocular movements intact.     Conjunctiva/sclera: Conjunctivae normal.     Pupils: Pupils are equal, round, and reactive to light.  Cardiovascular:     Rate and Rhythm: Normal rate and regular rhythm.     Pulses: Normal pulses.     Heart  sounds: Normal heart sounds.  Pulmonary:     Effort: Pulmonary effort is normal. No respiratory distress.     Breath sounds: Normal breath sounds. No stridor. No wheezing, rhonchi or rales.  Abdominal:     General: Bowel sounds are normal.     Palpations: Abdomen is soft.     Tenderness: There is no abdominal tenderness.  Musculoskeletal:     Cervical back: Normal range of motion.  Lymphadenopathy:     Cervical: No cervical adenopathy.  Skin:    General: Skin is warm and dry.  Neurological:     General: No focal deficit present.     Mental Status: He is alert and oriented to person, place, and time.  Psychiatric:        Mood and Affect: Mood normal.        Behavior: Behavior normal.      UC Treatments / Results  Labs (all labs ordered are listed, but only abnormal results are displayed) Labs Reviewed  POC COVID19/FLU A&B COMBO    EKG   Radiology No results found.  Procedures Procedures (including critical care time)  Medications Ordered in UC Medications - No data to display  Initial Impression / Assessment and Plan / UC Course  I have reviewed the triage vital signs and the nursing notes.  Pertinent labs & imaging  results that were available during my care of the patient were reviewed by me and considered in my medical decision making (see chart for details).  The COVID/flu test was negative.  Suspect this is a viral illness.  Supportive care recommendations were provided and discussed with the patient to include fluids, rest, and over-the-counter analgesics.  Discussed with patient the course of viral etiology and when follow-up would be indicated.  Patient was in agreement with this plan of care and verbalized understanding.  All questions were answered.  Patient stable for discharge.  Final Clinical Impressions(s) / UC Diagnoses   Final diagnoses:  Viral illness     Discharge Instructions      The COVID/influenza test was negative.  This most likely is  a viral illness. Increase fluids and allow for plenty of rest.  Try to drink at least 8-10 8 ounce glasses of water while symptoms persist. May take over-the-counter Tylenol as needed for pain, fever, or general discomfort. May use normal saline nasal spray throughout the day for nasal congestion. If you develop a cough, recommend using a humidifier in your bedroom at nighttime during sleep and sleeping slightly elevated on pillows while symptoms persist. You should remain home until you have been fever free for 24 hours with no medication.  Also recommend staying away from others during this time. You may begin to feel worse over the next 12 to 24 hours, which is expected.  If your symptoms continue to persist or worsen over the next 5 to 7 days, or if you have other concerns, you may follow-up in this clinic or with your primary care physician for further evaluation. Follow-up as needed.     ED Prescriptions   None    PDMP not reviewed this encounter.   Abran Cantor, NP 01/25/23 [phone]

## 2023-03-02 ENCOUNTER — Encounter: Payer: Self-pay | Admitting: Family Medicine

## 2023-03-03 ENCOUNTER — Ambulatory Visit: Payer: 59 | Admitting: Family Medicine

## 2023-03-03 ENCOUNTER — Encounter: Payer: Self-pay | Admitting: Family Medicine

## 2023-03-03 VITALS — BP 114/78 | HR 84 | Temp 98.0°F | Wt 216.6 lb

## 2023-03-03 DIAGNOSIS — J0120 Acute ethmoidal sinusitis, unspecified: Secondary | ICD-10-CM

## 2023-03-03 DIAGNOSIS — S61216A Laceration without foreign body of right little finger without damage to nail, initial encounter: Secondary | ICD-10-CM

## 2023-03-03 MED ORDER — PREDNISONE 10 MG PO TABS: ORAL_TABLET | ORAL | 0 refills | Status: DC

## 2023-03-03 NOTE — Telephone Encounter (Signed)
Patient was seen today by Dr Beverely Low and does not need a response any longer

## 2023-03-03 NOTE — Patient Instructions (Signed)
Follow up as needed or as scheduled START the Prednisone as directed- 3 pills at the same time x3 days, then 2 pills at the same time x3 days, then 1 pill daily.  Take w/ food  Continue cough syrup as needed Drink LOTS of fluids The Prednisone will cause your sugars to go up- this is expected and will only be short term Thankfully we don't need to stitch your finger and you're up to date on tetanus Keep area clean and dry Call with any questions or concerns Hang in there!

## 2023-03-03 NOTE — Progress Notes (Signed)
   Subjective:    Patient ID: Shawn Ban., male    DOB: 10/16/1960, 62 y.o.   MRN: [id number]  HPI Sinus pressure- 'my sinuses keep draining'.  Pt reports he has been using OTC medications w/ some relief.  Sxs started ~1 week ago.  Pt reports drainage is currently clear.  + PND.  Now w/ productive cough.  No fevers.  + pressure behind the eyes.  No ear pain.  Pt reports hx of similar.  Denies HA.  No tooth pain.  Laceration- new.  Pt cut R 5th finger this morning on sheet of aluminum.  UTD on Tdap   Review of Systems For ROS see HPI     Objective:   Physical Exam Vitals reviewed.  Constitutional:      General: He is not in acute distress.    Appearance: Normal appearance. He is well-developed. He is not ill-appearing.  HENT:     Head: Normocephalic and atraumatic.     Right Ear: Tympanic membrane and ear canal normal.     Left Ear: Tympanic membrane and ear canal normal.     Nose: Congestion present.     Comments: No TTP over frontal or maxillary sinuses    Mouth/Throat:     Mouth: Mucous membranes are moist.     Pharynx: Posterior oropharyngeal erythema (copious PND) present. No oropharyngeal exudate.  Eyes:     Conjunctiva/sclera: Conjunctivae normal.     Pupils: Pupils are equal, round, and reactive to light.  Cardiovascular:     Rate and Rhythm: Normal rate and regular rhythm.     Heart sounds: Normal heart sounds.  Pulmonary:     Effort: Pulmonary effort is normal. No respiratory distress.     Breath sounds: Normal breath sounds. No wheezing.  Musculoskeletal:     Cervical back: Normal range of motion and neck supple.  Lymphadenopathy:     Cervical: No cervical adenopathy.  Skin:    General: Skin is warm and dry.     Comments: 2 cm linear laceration of R 5th finger lateral to nail w/ good approximation of edges.  No active bleeding.  Wound cleaned w/ H2O2 and fresh band aid applied.           Assessment & Plan:  Sinusitis- new.  No evidence of  bacterial infxn on PE and pt's sxs are mild w/ exception of copious nasal congestion/drainage.  He feels well enough to go hunting this afternoon.  No need for abx.  Start Prednisone taper to improve congestion and drainage.  Recent A1C w/ good glucose control.  Pt cautioned that Prednisone will increase sugars and he needs to be mindful of his carb intake.  Reviewed supportive care and red flags that should prompt return.  Pt expressed understanding and is in agreement w/ plan.   Laceration L 5th finger- new.  Occurred earlier this morning.  UTD on Tdap.  No need for sutures.  Wound was cleaned and dressed and pt provided w/ instructions.

## 2023-03-07 ENCOUNTER — Telehealth: Payer: Self-pay

## 2023-03-07 MED ORDER — AZITHROMYCIN 250 MG PO TABS: ORAL_TABLET | ORAL | 0 refills | Status: DC

## 2023-03-07 NOTE — Telephone Encounter (Signed)
 Pt reports his mom Antibiotic due to the same chest congestion and he would like to know if this is okay for him to take. I told pt I can not advise him to take anyone medication Pt states he is on Steroid and he has concerns of sugar being high due to z-pak. Pt states he asked for abx and not z-pak.  Pt states he took his moms abx this morning and wanted to know if he can take the z-pak with this. I explained we can not advise him on taking anyone's medication. Pt was frustrated and I advised him to go to Urgent Care if he would like to be seen for this. Pt states he will wait for a call from Dr.Tabori

## 2023-03-07 NOTE — Telephone Encounter (Signed)
 Since it is a Friday afternoon and there are no available appts, will send in Zpack for pt (but this is not our standard practice).  If symptoms change or worsen, we want people to be re-evaluated to ensure we are treating them appropriately.  If symptoms don't improve w/ the Zpack, will need to schedule an appt

## 2023-03-07 NOTE — Telephone Encounter (Signed)
 Copied from CRM [id number]. Topic: Clinical - Medical Advice >> Mar 07, 2023 12:54 PM Chantha C wrote: Reason for CRM: Please advise and call patient at [phone]. Sinus issues OV 03/03/23, chest was clear during the office visit, but now having chest congestion and coughing green phelgm. Patient would like antibiotics today send to [id number] - [place] ([place]), [place] - [address] [place] ([place]) [place] [address] [phone]:[phone] Patient states he can take Amoxil , does not feel he is allergic to the medication anymore.

## 2023-03-07 NOTE — Telephone Encounter (Signed)
 Patient requesting Abx please advise, appt was 03/03/2023

## 2023-03-08 NOTE — Telephone Encounter (Signed)
 A Zpack IS an antibiotic.  He should not be taking anyone else's medications as we 1) do not know what it is or 2) how it will interact w/ his other medications.  The Azithromycin  (Zpack) will not have any impact on his sugars- the Prednisone  will but we discussed this at his appt.

## 2023-03-10 NOTE — Telephone Encounter (Signed)
 Pt has been informed and voiced understanding,   Pt also notes he stopped simvastatin, taking Co Q10 enzyme and red yeast rice instead

## 2023-03-14 ENCOUNTER — Ambulatory Visit: Payer: 59 | Admitting: Family Medicine

## 2023-03-15 ENCOUNTER — Ambulatory Visit (INDEPENDENT_AMBULATORY_CARE_PROVIDER_SITE_OTHER): Payer: 59

## 2023-03-15 ENCOUNTER — Ambulatory Visit
Admission: EM | Admit: 2023-03-15 | Discharge: 2023-03-15 | Disposition: A | Discharge status: Home / Self Care | Payer: 59 | Attending: Family Medicine | Admitting: Family Medicine

## 2023-03-15 DIAGNOSIS — R051 Acute cough: Secondary | ICD-10-CM

## 2023-03-15 DIAGNOSIS — J209 Acute bronchitis, unspecified: Secondary | ICD-10-CM

## 2023-03-15 MED ORDER — DEXAMETHASONE SODIUM PHOSPHATE 10 MG/ML IJ SOLN
10.0000 mg | Freq: Once | INTRAMUSCULAR | Status: AC
  Administered 2023-03-15: 10 mg via INTRAMUSCULAR

## 2023-03-15 NOTE — ED Provider Notes (Signed)
 [facility]    CSN: [id number] Arrival date & time: 03/15/23  1235      History   Chief Complaint No chief complaint on file.   HPI Shawn Kiner. is a 63 y.o. male.   Patient presenting today with about 3 weeks of ongoing sinus drainage, sinus congestion, cough, chest tightness.  Denies fever, chills, chest pain, severe shortness of breath, abdominal pain, nausea vomiting or diarrhea.  Just finished azithromycin  and prednisone  last week and states he got a bit better but symptoms are persisting.  So far taking Flonase , nasal sprays, Coricidin HBP, Mucinex with minimal relief.  History of seasonal allergies, chronic bronchitis.    Past Medical History:  Diagnosis Date   Allergy    Arthritis    Chronic bronchitis    Chronic pain    per pt/ right shoulder pain, left leg and ankle   CKD (chronic kidney disease) stage 3, GFR 30-59 ml/min (HCC)    Costochondral chest pain    due to torn tendons.   CTS (carpal tunnel syndrome)    right hand   Diabetes mellitus without complication (HCC)    prediabetes/ no meds   Diverticulitis    GERD (gastroesophageal reflux disease)    History of anal fissures    Hyperlipemia    Hypertension    Rectal bleeding    occasional   Seasonal allergies     Patient Active Problem List   Diagnosis Date Noted   Acute viral conjunctivitis of right eye 08/28/2020   Routine general medical examination at a health care facility 05/01/2020   Hyperlipidemia LDL goal <70 03/15/2020   Hypertriglyceridemia 03/15/2020   Diabetes mellitus without complication (HCC)    Benign essential HTN 08/25/2019    Past Surgical History:  Procedure Laterality Date   ANKLE FRACTURE SURGERY  2005 / 2007   left ankle and  left leg/ have screws in leg and ankle   CARPAL TUNNEL RELEASE Left 07/29/2019   Procedure: CARPAL TUNNEL RELEASE;  Surgeon: Murrell Kuba, MD;  Location: [facility];  Service: Orthopedics;  Laterality: Left;  FOREARM  BLOCK   EXAMINATION UNDER ANESTHESIA N/A 12/15/2012   Procedure: EXAM UNDER ANESTHESIA;  Surgeon: Camellia CHRISTELLA Blush, MD;  Location: [facility];  Service: General;  Laterality: N/A;   FRACTURE SURGERY  2005 and 2007   lt ankle x2   HEMORRHOIDECTOMY WITH HEMORRHOID BANDING N/A 12/15/2012   Procedure: EXCISIONAL HEMORRHOIDECTOMY WITH HEMORRHOID BANDING;  Surgeon: Camellia CHRISTELLA Blush, MD;  Location: [facility];  Service: General;  Laterality: N/A;   rotator cuff surgery      x 2/ right shoulder   TRIGGER FINGER RELEASE  07/23/2011   Procedure: Right hand- RELEASE TRIGGER FINGER/A-1 PULLEY;  Surgeon: Lamar LULLA Leonor Mickey., MD;  Location: [facility];  Service: Orthopedics;  Laterality: Right;   WRIST FRACTURE SURGERY  1984   lt with bone graft   WRIST SURGERY     left wrist       Home Medications    Prior to Admission medications   Medication Sig Start Date End Date Taking? Authorizing Provider  ASPIRIN 81 PO Take 1 tablet by mouth daily.    [provider]  azithromycin  (ZITHROMAX ) 250 MG tablet 2 tabs on day 1, 1 tab on day 2-5 03/07/23   Tabori, Katherine E, MD  Biotin 5000 MCG CAPS 2 times a week 12/03/14   [provider]  brompheniramine-pseudoephedrine-DM 30-2-10 MG/5ML  syrup Take 5 mLs by mouth 4 (four) times daily as needed. 05/18/22   Leath-Warren, Etta PARAS, NP  carvedilol  (COREG ) 6.25 MG tablet Take 1 tablet (6.25 mg total) by mouth 2 (two) times daily with a meal. 11/20/22   Levora Reyes SAUNDERS, MD  cetirizine  (ZYRTEC ) 10 MG tablet Take 1 tablet (10 mg total) by mouth daily. 10/28/19   Duanne Butler DASEN, MD  Collagen Hydrolysate, Bovine, POWD by Does not apply route. Tax Inspector for arthritis    [provider]  FIBER COMPLETE PO Take by mouth. Vita Fusion fiber gummies    [provider]  fluticasone  (FLONASE ) 50 MCG/ACT nasal spray Place 2 sprays into both nostrils daily. 04/27/18   Duanne Butler DASEN, MD  Garlic  Oil 1000 MG CAPS  10/02/20   [provider]  Glucosamine-Chondroitin-MSM 500-200-150 MG TABS Take by mouth.    [provider]  glucose blood (ONETOUCH VERIO) test strip USE 1 STRIP TO CHECK GLUCOSE IN THE MORNING AND AT BEDTIME 11/20/22   Levora Reyes SAUNDERS, MD  L-Lysine 1000 MG TABS Take by mouth.    [provider]  L-Lysine 1000 MG TABS  03/04/12   [provider]  Lancets (FREESTYLE) lancets Use as instructed 10/28/19   Duanne Butler DASEN, MD  linagliptin  (TRADJENTA ) 5 MG TABS tablet Take 1 tablet (5 mg total) by mouth daily. 11/20/22   Levora Reyes SAUNDERS, MD  losartan  (COZAAR ) 100 MG tablet Take 1 tablet (100 mg total) by mouth daily. 11/20/22   Levora Reyes SAUNDERS, MD  MAGNESIUM OXIDE PO Take 375 mg by mouth daily.    [provider]  mometasone -formoterol (DULERA) 100-5 MCG/ACT AERO Inhale 2 puffs into the lungs 2 (two) times daily.    [provider]  Multiple Vitamins-Minerals (VISION FORMULA EYE HEALTH PO)  06/02/17   [provider]  omega-3 acid ethyl esters (LOVAZA ) 1 g capsule Take 2 capsules (2 g total) by mouth 2 (two) times daily. 11/20/22   Levora Reyes SAUNDERS, MD  omeprazole  (PRILOSEC) 20 MG capsule Take 1 capsule (20 mg total) by mouth 2 (two) times daily before a meal. 08/15/22   Levora Reyes SAUNDERS, MD  predniSONE  (DELTASONE ) 10 MG tablet 3 tabs x3 days and then 2 tabs x3 days and then 1 tab x3 days.  Take w/ food. 03/03/23   Tabori, Katherine E, MD  simvastatin  (ZOCOR ) 20 MG tablet Take 1 tablet (20 mg total) by mouth at bedtime. Patient not taking: Reported on 03/03/2023 11/20/22   Levora Reyes SAUNDERS, MD  triamcinolone  cream (KENALOG ) 0.1 % Apply 1 Application topically 2 (two) times daily. 08/23/22   Levora Reyes SAUNDERS, MD    Family History Family History  Problem Relation Age of Onset   Heart disease Mother    Hyperlipidemia Mother    Diabetes Mother    Macular degeneration Mother    Cancer Father        skin   Macular  degeneration Sister    Diabetes Brother    Colon cancer Neg Hx    Stomach cancer Neg Hx    Esophageal cancer Neg Hx    Rectal cancer Neg Hx     Social History Social History   Tobacco Use   Smoking status: Never   Smokeless tobacco: Former    Types: Chew   Tobacco comments:    Quit age 71 y.o  Vaping Use   Vaping status: Never Used  Substance Use Topics   Alcohol use: No  Drug use: No     Allergies   Amlodipine, Codeine, Ivp dye [iodinated contrast media], Penicillins, and Sulfa antibiotics   Review of Systems Review of Systems Per HPI  Physical Exam Triage Vital Signs ED Triage Vitals  Encounter Vitals Group     BP 03/15/23 1242 135/87     Systolic BP Percentile --      Diastolic BP Percentile --      Pulse Rate 03/15/23 1242 91     Resp 03/15/23 1242 18     Temp 03/15/23 1242 98 F (36.7 C)     Temp Source 03/15/23 1242 Oral     SpO2 03/15/23 1242 93 %     Weight --      Height --      Head Circumference --      Peak Flow --      Pain Score 03/15/23 1243 0     Pain Loc --      Pain Education --      Exclude from Growth Chart --    No data found.  Updated Vital Signs BP 135/87 (BP Location: Right Arm)   Pulse 91   Temp 98 F (36.7 C) (Oral)   Resp 18   SpO2 93%   Visual Acuity Right Eye Distance:   Left Eye Distance:   Bilateral Distance:    Right Eye Near:   Left Eye Near:    Bilateral Near:     Physical Exam Vitals and nursing note reviewed.  Constitutional:      Appearance: He is well-developed.  HENT:     Head: Atraumatic.     Right Ear: External ear normal.     Left Ear: External ear normal.     Nose: Rhinorrhea present.     Mouth/Throat:     Pharynx: Posterior oropharyngeal erythema present. No oropharyngeal exudate.  Eyes:     Conjunctiva/sclera: Conjunctivae normal.     Pupils: Pupils are equal, round, and reactive to light.  Cardiovascular:     Rate and Rhythm: Normal rate and regular rhythm.  Pulmonary:     Effort:  Pulmonary effort is normal. No respiratory distress.     Breath sounds: No wheezing or rales.  Musculoskeletal:        General: Normal range of motion.     Cervical back: Normal range of motion and neck supple.  Lymphadenopathy:     Cervical: No cervical adenopathy.  Skin:    General: Skin is warm and dry.  Neurological:     Mental Status: He is alert and oriented to person, place, and time.  Psychiatric:        Behavior: Behavior normal.      UC Treatments / Results  Labs (all labs ordered are listed, but only abnormal results are displayed) Labs Reviewed - No data to display  EKG   Radiology DG Chest 2 View Result Date: 03/15/2023 CLINICAL DATA:  Ongoing productive cough EXAM: CHEST - 2 VIEW COMPARISON:  None Available. FINDINGS: Normal mediastinum and cardiac silhouette. Normal pulmonary vasculature. No evidence of effusion, infiltrate, or pneumothorax. No acute bony abnormality. IMPRESSION: No acute cardiopulmonary process. Electronically Signed   By: Jackquline Boxer M.D.   On: 03/15/2023 13:27    Procedures Procedures (including critical care time)  Medications Ordered in UC Medications  dexamethasone  (DECADRON ) injection 10 mg (10 mg Intramuscular Given 03/15/23 1347)    Initial Impression / Assessment and Plan / UC Course  I have reviewed the triage vital  signs and the nursing notes.  Pertinent labs & imaging results that were available during my care of the patient were reviewed by me and considered in my medical decision making (see chart for details).     Vitals and exam very reassuring today, chest x-ray negative for pneumonia or other acute cardiopulmonary abnormalities.  Will treat for bronchitis with IM Decadron , continued allergy regimen and Mucinex and supportive home care.  Return for worsening symptoms.  Final Clinical Impressions(s) / UC Diagnoses   Final diagnoses:  Acute cough  Acute bronchitis, unspecified organism     Discharge  Instructions      Your chest x-ray was negative for pneumonia today which is great news.  We have given you a steroid shot and and recommend you to keep taking Mucinex twice daily, your allergy regimen and drinking plenty of water.  Follow-up for worsening symptoms.    ED Prescriptions   None    PDMP not reviewed this encounter.   Stuart Vernell Norris, [place] 03/15/23 1349

## 2023-03-15 NOTE — ED Triage Notes (Signed)
 Pt reports cough, throat drainage, chest congestion, and head pressure x 3 week.   States he feels like his chest and throat are "closing in" on

## 2023-03-15 NOTE — Discharge Instructions (Signed)
 Your chest x-ray was negative for pneumonia today which is great news.  We have given you a steroid shot and and recommend you to keep taking Mucinex twice daily, your allergy regimen and drinking plenty of water.  Follow-up for worsening symptoms.

## 2023-03-17 NOTE — Telephone Encounter (Signed)
 Noted that patient seen by urgent care and rtc advise if needed.

## 2023-04-05 ENCOUNTER — Other Ambulatory Visit: Payer: Self-pay | Admitting: Family Medicine

## 2023-04-05 DIAGNOSIS — E781 Pure hyperglyceridemia: Secondary | ICD-10-CM

## 2023-05-02 ENCOUNTER — Other Ambulatory Visit: Payer: Self-pay | Admitting: Family Medicine

## 2023-05-02 DIAGNOSIS — E781 Pure hyperglyceridemia: Secondary | ICD-10-CM

## 2023-05-14 ENCOUNTER — Other Ambulatory Visit: Payer: Self-pay | Admitting: Family Medicine

## 2023-05-14 DIAGNOSIS — E781 Pure hyperglyceridemia: Secondary | ICD-10-CM

## 2023-05-15 ENCOUNTER — Other Ambulatory Visit: Payer: Self-pay | Admitting: Family Medicine

## 2023-05-15 DIAGNOSIS — E781 Pure hyperglyceridemia: Secondary | ICD-10-CM

## 2023-05-15 MED ORDER — OMEGA-3-ACID ETHYL ESTERS 1 G PO CAPS: 2.0000 | ORAL_CAPSULE | Freq: Two times a day (BID) | ORAL | 0 refills | Status: DC

## 2023-05-22 ENCOUNTER — Ambulatory Visit: Payer: 59 | Admitting: Family Medicine

## 2023-05-22 ENCOUNTER — Encounter: Payer: Self-pay | Admitting: Family Medicine

## 2023-05-22 VITALS — BP 126/80 | HR 68 | Temp 97.9°F | Ht 71.25 in | Wt 214.2 lb

## 2023-05-22 DIAGNOSIS — N182 Chronic kidney disease, stage 2 (mild): Secondary | ICD-10-CM | POA: Diagnosis not present

## 2023-05-22 DIAGNOSIS — E1122 Type 2 diabetes mellitus with diabetic chronic kidney disease: Secondary | ICD-10-CM | POA: Diagnosis not present

## 2023-05-22 DIAGNOSIS — K219 Gastro-esophageal reflux disease without esophagitis: Secondary | ICD-10-CM

## 2023-05-22 DIAGNOSIS — E781 Pure hyperglyceridemia: Secondary | ICD-10-CM

## 2023-05-22 DIAGNOSIS — E782 Mixed hyperlipidemia: Secondary | ICD-10-CM | POA: Diagnosis not present

## 2023-05-22 DIAGNOSIS — Z125 Encounter for screening for malignant neoplasm of prostate: Secondary | ICD-10-CM

## 2023-05-22 DIAGNOSIS — I10 Essential (primary) hypertension: Secondary | ICD-10-CM | POA: Diagnosis not present

## 2023-05-22 DIAGNOSIS — Z0000 Encounter for general adult medical examination without abnormal findings: Secondary | ICD-10-CM | POA: Diagnosis not present

## 2023-05-22 LAB — COMPREHENSIVE METABOLIC PANEL
ALT: 23 U/L (ref 0–53)
AST: 19 U/L (ref 0–37)
Albumin: 4.3 g/dL (ref 3.5–5.2)
Alkaline Phosphatase: 66 U/L (ref 39–117)
BUN: 20 mg/dL (ref 6–23)
CO2: 29 meq/L (ref 19–32)
Calcium: 9.4 mg/dL (ref 8.4–10.5)
Chloride: 103 meq/L (ref 96–112)
Creatinine, Ser: 1.27 mg/dL (ref 0.40–1.50)
GFR: 61.05 mL/min (ref >60.00)
Glucose, Bld: 133 mg/dL — ABNORMAL HIGH (ref 70–99)
Potassium: 4.4 meq/L (ref 3.5–5.1)
Sodium: 140 meq/L (ref 135–145)
Total Bilirubin: 0.7 mg/dL (ref 0.2–1.2)
Total Protein: 6.8 g/dL (ref 6.0–8.3)

## 2023-05-22 LAB — CBC WITH DIFFERENTIAL/PLATELET
Basophils Absolute: 0.1 10*3/uL (ref 0.0–0.1)
Basophils Relative: 0.9 % (ref 0.0–3.0)
Eosinophils Absolute: 0.2 10*3/uL (ref 0.0–0.7)
Eosinophils Relative: 4 % (ref 0.0–5.0)
HCT: 45.1 % (ref 39.0–52.0)
Hemoglobin: 15.2 g/dL (ref 13.0–17.0)
Lymphocytes Relative: 32.7 % (ref 12.0–46.0)
Lymphs Abs: 1.9 10*3/uL (ref 0.7–4.0)
MCHC: 33.6 g/dL (ref 30.0–36.0)
MCV: 86.8 fl (ref 78.0–100.0)
Monocytes Absolute: 0.4 10*3/uL (ref 0.1–1.0)
Monocytes Relative: 6.8 % (ref 3.0–12.0)
Neutro Abs: 3.2 10*3/uL (ref 1.4–7.7)
Neutrophils Relative %: 55.6 % (ref 43.0–77.0)
Platelets: 250 10*3/uL (ref 150.0–400.0)
RBC: 5.2 Mil/uL (ref 4.22–5.81)
RDW: 14 % (ref 11.5–15.5)
WBC: 5.7 10*3/uL (ref 4.0–10.5)

## 2023-05-22 LAB — LIPID PANEL
Cholesterol: 176 mg/dL (ref 0–200)
HDL: 30 mg/dL — ABNORMAL LOW (ref >39.00)
LDL Cholesterol: 116 mg/dL — ABNORMAL HIGH (ref 0–99)
NonHDL: 145.55
Total CHOL/HDL Ratio: 6
Triglycerides: 149 mg/dL (ref 0.0–149.0)
VLDL: 29.8 mg/dL (ref 0.0–40.0)

## 2023-05-22 LAB — MICROALBUMIN / CREATININE URINE RATIO
Creatinine,U: 150.8 mg/dL
Microalb Creat Ratio: 63 mg/g — ABNORMAL HIGH (ref 0.0–30.0)
Microalb, Ur: 9.5 mg/dL — ABNORMAL HIGH (ref 0.0–1.9)

## 2023-05-22 LAB — HEMOGLOBIN A1C: Hgb A1c MFr Bld: 6.6 % — ABNORMAL HIGH (ref 4.6–6.5)

## 2023-05-22 LAB — PSA: PSA: 0.86 ng/mL (ref 0.10–4.00)

## 2023-05-22 MED ORDER — CARVEDILOL 6.25 MG PO TABS: 6.2500 mg | ORAL_TABLET | Freq: Two times a day (BID) | ORAL | 2 refills | Status: DC

## 2023-05-22 MED ORDER — LINAGLIPTIN 5 MG PO TABS: 5.0000 mg | ORAL_TABLET | Freq: Every day | ORAL | 2 refills | Status: DC

## 2023-05-22 MED ORDER — OMEPRAZOLE 20 MG PO CPDR
20.0000 mg | DELAYED_RELEASE_CAPSULE | Freq: Two times a day (BID) | ORAL | 3 refills | Status: AC
Start: 2023-05-22 — End: ?

## 2023-05-22 MED ORDER — LOSARTAN POTASSIUM 100 MG PO TABS: 100.0000 mg | ORAL_TABLET | Freq: Every day | ORAL | 2 refills | Status: AC

## 2023-05-22 MED ORDER — OMEGA-3-ACID ETHYL ESTERS 1 G PO CAPS: 2.0000 | ORAL_CAPSULE | Freq: Two times a day (BID) | ORAL | 5 refills | Status: DC

## 2023-05-22 NOTE — Patient Instructions (Addendum)
 Based on your concerns with other statin medications, I think it would be reasonable to consider meeting with a lipid specialist if you are not able to tolerate any statins to decide on other medications.  We can try pravastatin depending on your cholesterol levels from today.  I will check your labs and then we can discuss low dose pravastatin.  If you do have issues taking pravastatin, then I would recommend meeting with cardiology, lipid specialist to look into alternate options.  No other med changes at this time.  If any concerns on labs I will let you know.  Take care!  Preventive Care 42-65 Years Old, Male Preventive care refers to lifestyle choices and visits with your health care provider that can promote health and wellness. Preventive care visits are also called wellness exams. What can I expect for my preventive care visit? Counseling During your preventive care visit, your health care provider may ask about your: Medical history, including: Past medical problems. Family medical history. Current health, including: Emotional well-being. Home life and relationship well-being. Sexual activity. Lifestyle, including: Alcohol, nicotine or tobacco, and drug use. Access to firearms. Diet, exercise, and sleep habits. Safety issues such as seatbelt and bike helmet use. Sunscreen use. Work and work Astronomer. Physical exam Your health care provider will check your: Height and weight. These may be used to calculate your BMI (body mass index). BMI is a measurement that tells if you are at a healthy weight. Waist circumference. This measures the distance around your waistline. This measurement also tells if you are at a healthy weight and may help predict your risk of certain diseases, such as type 2 diabetes and high blood pressure. Heart rate and blood pressure. Body temperature. Skin for abnormal spots. What immunizations do I need?  Vaccines are usually given at various ages,  according to a schedule. Your health care provider will recommend vaccines for you based on your age, medical history, and lifestyle or other factors, such as travel or where you work. What tests do I need? Screening Your health care provider may recommend screening tests for certain conditions. This may include: Lipid and cholesterol levels. Diabetes screening. This is done by checking your blood sugar (glucose) after you have not eaten for a while (fasting). Hepatitis B test. Hepatitis C test. HIV (human immunodeficiency virus) test. STI (sexually transmitted infection) testing, if you are at risk. Lung cancer screening. Prostate cancer screening. Colorectal cancer screening. Talk with your health care provider about your test results, treatment options, and if necessary, the need for more tests. Follow these instructions at home: Eating and drinking  Eat a diet that includes fresh fruits and vegetables, whole grains, lean protein, and low-fat dairy products. Take vitamin and mineral supplements as recommended by your health care provider. Do not drink alcohol if your health care provider tells you not to drink. If you drink alcohol: Limit how much you have to 0-2 drinks a day. Know how much alcohol is in your drink. In the [place] one drink equals one 12 oz bottle of beer (355 mL), one 5 oz glass of wine (148 mL), or one 1 oz glass of hard liquor (44 mL). Lifestyle Brush your teeth every morning and night with fluoride toothpaste. Floss one time each day. Exercise for at least 30 minutes 5 or more days each week. Do not use any products that contain nicotine or tobacco. These products include cigarettes, chewing tobacco, and vaping devices, such as e-cigarettes. If you need help quitting,  ask your health care provider. Do not use drugs. If you are sexually active, practice safe sex. Use a condom or other form of protection to prevent STIs. Take aspirin only as told by your health care  provider. Make sure that you understand how much to take and what form to take. Work with your health care provider to find out whether it is safe and beneficial for you to take aspirin daily. Find healthy ways to manage stress, such as: Meditation, yoga, or listening to music. Journaling. Talking to a trusted person. Spending time with friends and family. Minimize exposure to UV radiation to reduce your risk of skin cancer. Safety Always wear your seat belt while driving or riding in a vehicle. Do not drive: If you have been drinking alcohol. Do not ride with someone who has been drinking. When you are tired or distracted. While texting. If you have been using any mind-altering substances or drugs. Wear a helmet and other protective equipment during sports activities. If you have firearms in your house, make sure you follow all gun safety procedures. What's next? Go to your health care provider once a year for an annual wellness visit. Ask your health care provider how often you should have your eyes and teeth checked. Stay up to date on all vaccines. This information is not intended to replace advice given to you by your health care provider. Make sure you discuss any questions you have with your health care provider. Document Revised: 08/16/2020 Document Reviewed: 08/16/2020 Elsevier Patient Education  2024 [employer].

## 2023-05-22 NOTE — Progress Notes (Signed)
 Subjective:  Patient ID: Shawn Ban., male    DOB: 03-31-1960  Age: 63 y.o. MRN: [id number]  CC:  Chief Complaint  Patient presents with   Annual Exam    Pt is well no concerns, pt is fasting     HPI Shawn Ban. presents for Annual Exam PCP: GI - Dr. Myrtie Neither for colonosopy.  Urology: Dr. Alvester Morin.   Diabetes: Complicated by microalbuminuria, CKD, hyperglycemia previously, stable control on most recent levels.  Treated with Tradjenta 5 mg, and on statin, ARB. Previously has taken Tradjenta daily but extra half pill at night if elevated readings.  This was once to twice per week when discussed in September of last year. Only on 1 per day now.  Home readings fasting: 120's Postprandial: 1140-160.  No symptomatic lows.  Microalbumin: 11.1 on 05/08/2022.  Repeat today. Optho, foot exam, pneumovax:  Due for foot exam: Diabetic Foot Exam - Simple   Simple Foot Form Visual Inspection No deformities, no ulcerations, no other skin breakdown bilaterally: Yes Sensation Testing Intact to touch and monofilament testing bilaterally: Yes Pulse Check Posterior Tibialis and Dorsalis pulse intact bilaterally: Yes Comments Pt reports no issues        Lab Results  Component Value Date   HGBA1C 6.4 11/20/2022   HGBA1C 6.2 08/15/2022   HGBA1C 6.4 05/08/2022   Lab Results  Component Value Date   MICROALBUR 11.1 (H) 05/08/2022   LDLCALC 89 11/20/2022   CREATININE 1.22 11/20/2022   Hypertension: Treated with Coreg 6.25 mg twice daily and losartan 100 mg daily without new side effects. Home readings: 120/70-80 range.  Prior CKD.  Advil about once per week.  BP Readings from Last 3 Encounters:  05/22/23 126/80  03/15/23 135/87  03/03/23 114/78   Lab Results  Component Value Date   CREATININE 1.22 11/20/2022   Hyperlipidemia: Treated with Zocor 20 mg daily, Lovaza 2 capsules twice daily. Sore on gum next to tooth. Had been there for years - attributed to using  simvastatin, so he stopped simvastatin in November last year - area on gum resolved - reports area resolved.  Now taking red yeast once per day.  Had not discussed area of concern with dentist.  Reports he has taken "all of them" except pravastatin - had nausea, pain in back, kidneys, cramps, mouth lesion. Same side effects with zetia.  Has not taken pravastatin, mother takes this - he is up for trying that one.   Lab Results  Component Value Date   CHOL 146 11/20/2022   HDL 32.80 (L) 11/20/2022   LDLCALC 89 11/20/2022   LDLDIRECT 75.0 06/25/2021   TRIG 122.0 11/20/2022   CHOLHDL 4 11/20/2022   Lab Results  Component Value Date   ALT 29 11/20/2022   AST 24 11/20/2022   ALKPHOS 59 11/20/2022   BILITOT 0.6 11/20/2022   Nocturia Eval by  urology on 11/27/2022, prior history of nocturia.  BPH with LUTS.  Recommend behavior modification, decrease caffeine intake, decrease water intake and if any and yearly PSA by PCP.     03/03/2023   11:33 AM 11/20/2022    8:12 AM 08/15/2022    8:09 AM 06/12/2022    9:55 AM 05/08/2022    2:15 PM  Depression screen PHQ 2/9  Decreased Interest 0 0 0 0 0  Down, Depressed, Hopeless 0 0 0 0 0  PHQ - 2 Score 0 0 0 0 0  Altered sleeping 0 0 0 1 3  Tired,  decreased energy 0 0 0 0 0  Change in appetite 0 0 0 0 0  Feeling bad or failure about yourself  0 0 0 0 0  Trouble concentrating 0 0 0 0 0  Moving slowly or fidgety/restless 0 0 0 0 0  Suicidal thoughts 0 0 0 0 0  PHQ-9 Score 0 0 0 1 3  Difficult doing work/chores Not difficult at all        Health Maintenance  Topic Date Due   INFLUENZA VACCINE  10/03/2022   COVID-19 Vaccine (4 - 2024-25 season) 11/03/2022   Diabetic kidney evaluation - Urine ACR  05/08/2023   HEMOGLOBIN A1C  05/20/2023   OPHTHALMOLOGY EXAM  07/24/2023   Diabetic kidney evaluation - eGFR measurement  11/20/2023   FOOT EXAM  05/21/2024   Pneumococcal Vaccine 33-72 Years old (3 of 3 - PPSV23 or PCV20) 01/22/2027   Colonoscopy   06/12/2027   DTaP/Tdap/Td (3 - Td or Tdap) 12/11/2031   Hepatitis C Screening  Completed   HIV Screening  Completed   Zoster Vaccines- Shingrix  Completed   HPV VACCINES  Aged Out  Colonoscopy 06/11/2017, diverticulosis in left and right colon, otherwise normal.  Repeat 10 years Prostate: does not have family history of prostate cancer The natural history of prostate cancer and ongoing controversy regarding screening and potential treatment outcomes of prostate cancer has been discussed with the patient. The meaning of a false positive PSA and a false negative PSA has been discussed. He indicates understanding of the limitations of this screening test and wishes to proceed with screening PSA testing.  Prior urology eval as above for BPH with LUTS. Lab Results  Component Value Date   PSA 0.34 05/08/2022   PSA 0.32 05/04/2021   PSA 0.33 05/01/2020   Immunization History  Administered Date(s) Administered   Influenza Inj Mdck Quad Pf 11/24/2019   Influenza,inj,Quad PF,6+ Mos 04/20/2018, 11/03/2018, 12/29/2019, 12/22/2020   Influenza-Unspecified 12/10/2021   Moderna Sars-Covid-2 Vaccination 05/11/2019, 06/08/2019, 07/24/2020   Pneumococcal Conjugate-13 03/15/2020   Pneumococcal Polysaccharide-23 04/14/2017   Tdap 03/15/2020   Tetanus 12/10/2021   Zoster Recombinant(Shingrix) 05/22/2017, 09/09/2017  Flu vaccine, COVID booster -reported to have a knee September 6 of last year at [facility]. Had RSV and pneumococcal vaccine earlier this year.   No results found. Optho - yearly. Appt in April - recent visit for floaters. Diagnosed with PVD. Improving.   Dental: last visit last year.   Alcohol: no  Tobacco: none, no vaping.   Exercise: 2 days a week spending time with grandson, cutting grass, trimming trees.   GERD: Omeprazole working well. Takes 2 one day, then probiotic next day with 1 pill - alternating 2 one day, 1 the next day.   History Patient Active Problem List    Diagnosis Date Noted   Acute viral conjunctivitis of right eye 08/28/2020   Routine general medical examination at a health care facility 05/01/2020   Hyperlipidemia LDL goal <70 03/15/2020   Hypertriglyceridemia 03/15/2020   Diabetes mellitus without complication (HCC)    Benign essential HTN 08/25/2019   Past Medical History:  Diagnosis Date   Allergy    Arthritis    Chronic bronchitis    Chronic pain    per pt/ right shoulder pain, left leg and ankle   CKD (chronic kidney disease) stage 3, GFR 30-59 ml/min (HCC)    Costochondral chest pain    due to torn tendons.   CTS (carpal tunnel syndrome)  right hand   Diabetes mellitus without complication (HCC)    prediabetes/ no meds   Diverticulitis    GERD (gastroesophageal reflux disease)    History of anal fissures    Hyperlipemia    Hypertension    Rectal bleeding    occasional   Seasonal allergies    Past Surgical History:  Procedure Laterality Date   ANKLE FRACTURE SURGERY  2005 / 2007   left ankle and  left leg/ have screws in leg and ankle   CARPAL TUNNEL RELEASE Left 07/29/2019   Procedure: CARPAL TUNNEL RELEASE;  Surgeon: Cindee Salt, MD;  Location: [facility];  Service: Orthopedics;  Laterality: Left;  FOREARM BLOCK   EXAMINATION UNDER ANESTHESIA N/A 12/15/2012   Procedure: EXAM UNDER ANESTHESIA;  Surgeon: Atilano Ina, MD;  Location: [facility];  Service: General;  Laterality: N/A;   FRACTURE SURGERY  2005 and 2007   lt ankle x2   HEMORRHOIDECTOMY WITH HEMORRHOID BANDING N/A 12/15/2012   Procedure: EXCISIONAL HEMORRHOIDECTOMY WITH HEMORRHOID BANDING;  Surgeon: Atilano Ina, MD;  Location: [facility];  Service: General;  Laterality: N/A;   rotator cuff surgery      x 2/ right shoulder   TRIGGER FINGER RELEASE  07/23/2011   Procedure: Right hand- RELEASE TRIGGER FINGER/A-1 PULLEY;  Surgeon: Wyn Forster., MD;  Location: [facility];  Service:  Orthopedics;  Laterality: Right;   WRIST FRACTURE SURGERY  1984   lt with bone graft   WRIST SURGERY     left wrist   Allergies  Allergen Reactions   Amlodipine Other (See Comments)    Per pt - damage to kidney's   Codeine Nausea And Vomiting   Ivp Dye [Iodinated Contrast Media] Nausea Only    Nauseated   Penicillins Nausea And Vomiting    Patient states he has taken Amoxicillin without issue recently   Sulfa Antibiotics Nausea Only    nauseated   Prior to Admission medications   Medication Sig Start Date End Date Taking? Authorizing Provider  ASPIRIN 81 PO Take 1 tablet by mouth daily.    [provider]  azithromycin (ZITHROMAX) 250 MG tablet 2 tabs on day 1, 1 tab on day 2-5 03/07/23   Sheliah Hatch, MD  Biotin 5000 MCG CAPS 2 times a week 12/03/14   [provider]  brompheniramine-pseudoephedrine-DM 30-2-10 MG/5ML syrup Take 5 mLs by mouth 4 (four) times daily as needed. 05/18/22   Leath-Warren, Sadie Haber, NP  carvedilol (COREG) 6.25 MG tablet Take 1 tablet (6.25 mg total) by mouth 2 (two) times daily with a meal. 11/20/22   Shade Flood, MD  cetirizine (ZYRTEC) 10 MG tablet Take 1 tablet (10 mg total) by mouth daily. 10/28/19   Donita Brooks, MD  Collagen Hydrolysate, Bovine, POWD by Does not apply route. Takes Tour manager" for arthritis    [provider]  FIBER COMPLETE PO Take by mouth. Vita Fusion fiber gummies    [provider]  fluticasone (FLONASE) 50 MCG/ACT nasal spray Place 2 sprays into both nostrils daily. 04/27/18   Donita Brooks, MD  Garlic Oil 1000 MG CAPS  10/02/20   [provider]  Glucosamine-Chondroitin-MSM 500-200-150 MG TABS Take by mouth.    [provider]  glucose blood (ONETOUCH VERIO) test strip USE 1 STRIP TO CHECK GLUCOSE IN THE MORNING AND AT BEDTIME 11/20/22   Shade Flood, MD  L-Lysine 1000 MG TABS Take by  mouth.    [provider]  L-Lysine 1000 MG TABS  03/04/12   [provider]  Lancets (FREESTYLE) lancets Use as instructed 10/28/19   Donita Brooks, MD  linagliptin (TRADJENTA) 5 MG TABS tablet Take 1 tablet (5 mg total) by mouth daily. 11/20/22   Shade Flood, MD  losartan (COZAAR) 100 MG tablet Take 1 tablet (100 mg total) by mouth daily. 11/20/22   Shade Flood, MD  MAGNESIUM OXIDE PO Take 375 mg by mouth daily.    [provider]  mometasone-formoterol (DULERA) 100-5 MCG/ACT AERO Inhale 2 puffs into the lungs 2 (two) times daily.    [provider]  Multiple Vitamins-Minerals (VISION FORMULA EYE HEALTH PO)  06/02/17   [provider]  omega-3 acid ethyl esters (LOVAZA) 1 g capsule Take 2 capsules (2 g total) by mouth 2 (two) times daily. 05/15/23   Shade Flood, MD  omeprazole (PRILOSEC) 20 MG capsule Take 1 capsule (20 mg total) by mouth 2 (two) times daily before a meal. 08/15/22   Shade Flood, MD  predniSONE (DELTASONE) 10 MG tablet 3 tabs x3 days and then 2 tabs x3 days and then 1 tab x3 days.  Take w/ food. 03/03/23   Sheliah Hatch, MD  simvastatin (ZOCOR) 20 MG tablet Take 1 tablet (20 mg total) by mouth at bedtime. Patient not taking: Reported on 03/03/2023 11/20/22   Shade Flood, MD  triamcinolone cream (KENALOG) 0.1 % Apply 1 Application topically 2 (two) times daily. 08/23/22   Shade Flood, MD   Social History   Socioeconomic History   Marital status: Divorced    Spouse name: Not on file   Number of children: 1   Years of education: Not on file   Highest education level: Some college, no degree  Occupational History   Occupation: disabled  Tobacco Use   Smoking status: Never   Smokeless tobacco: Former    Types: Chew   Tobacco comments:    Quit age 76 y.o  Vaping Use   Vaping status: Never Used  Substance and Sexual Activity   Alcohol use: No   Drug use: No   Sexual activity: Not Currently  Other Topics Concern   Not on file  Social History Narrative   Not on file    Social Drivers of Health   Financial Resource Strain: Low Risk  (05/21/2023)   Overall Financial Resource Strain (CARDIA)    Difficulty of Paying Living Expenses: Not hard at all  Food Insecurity: No Food Insecurity (05/21/2023)   Hunger Vital Sign    Worried About Running Out of Food in the Last Year: Never true    Ran Out of Food in the Last Year: Never true  Transportation Needs: No Transportation Needs (05/21/2023)   PRAPARE - Administrator, Civil Service (Medical): No    Lack of Transportation (Non-Medical): No  Physical Activity: Unknown (05/21/2023)   Exercise Vital Sign    Days of Exercise per Week: 0 days    Minutes of Exercise per Session: Not on file  Stress: No Stress Concern Present (05/21/2023)   [employer] of Occupational Health - Occupational Stress Questionnaire    Feeling of Stress : Not at all  Social Connections: Moderately Integrated (05/21/2023)   Social Connection and Isolation Panel [NHANES]    Frequency of Communication with Friends and Family: Twice a week    Frequency of Social Gatherings with Friends and Family: Twice  a week    Attends Religious Services: More than 4 times per year    Active Member of Clubs or Organizations: Yes    Attends Banker Meetings: More than 4 times per year    Marital Status: Divorced  Catering manager Violence: Not on file    Review of Systems  13 point review of systems per patient health survey noted.  Negative other than as indicated above or in HPI.   Objective:   Vitals:   05/22/23 0810  BP: 126/80  Pulse: 68  Temp: 97.9 F (36.6 C)  TempSrc: Temporal  SpO2: 97%  Weight: 214 lb 3.2 oz (97.2 kg)  Height: 5' 11.25" (1.81 m)     Physical Exam Vitals reviewed.  Constitutional:      Appearance: He is well-developed.  HENT:     Head: Normocephalic and atraumatic.     Right Ear: External ear normal.     Left Ear: External ear normal.  Eyes:     Conjunctiva/sclera:  Conjunctivae normal.     Pupils: Pupils are equal, round, and reactive to light.  Neck:     Thyroid: No thyromegaly.  Cardiovascular:     Rate and Rhythm: Normal rate and regular rhythm.     Heart sounds: Normal heart sounds.  Pulmonary:     Effort: Pulmonary effort is normal. No respiratory distress.     Breath sounds: Normal breath sounds. No wheezing.  Abdominal:     General: There is no distension.     Palpations: Abdomen is soft.     Tenderness: There is no abdominal tenderness.  Musculoskeletal:        General: No tenderness. Normal range of motion.     Cervical back: Normal range of motion and neck supple.  Lymphadenopathy:     Cervical: No cervical adenopathy.  Skin:    General: Skin is warm and dry.  Neurological:     Mental Status: He is alert and oriented to person, place, and time.     Deep Tendon Reflexes: Reflexes are normal and symmetric.  Psychiatric:        Behavior: Behavior normal.        Assessment & Plan:  Shawn Ball. is a 63 y.o. male . Annual physical exam  - -anticipatory guidance as below in AVS, screening labs above. Health maintenance items as above in HPI discussed/recommended as applicable.   Benign essential HTN - Plan: carvedilol (COREG) 6.25 MG tablet, losartan (COZAAR) 100 MG tablet  -Check labs.  BP stable, continue same regimen.  Avoidance of NSAIDs discussed given history of CKD, Tylenol first if possible.  Type 2 diabetes mellitus with stage 2 chronic kidney disease, without long-term current use of insulin (HCC) - Plan: Comprehensive metabolic panel, Hemoglobin A1c, Microalbumin / creatinine urine ratio, linagliptin (TRADJENTA) 5 MG TABS tablet  -Check labs, continue Tradjenta same dose for now.  He is on ARB with history of microalbuminuria.  Repeat ratio as above.  Mixed hyperlipidemia - Plan: Lipid panel  -As above he has been intolerant with various side effects from multiple statins.  I am not sure if the gum  irritation/discomfort is definitively related to lovastatin but he did report improvement in symptoms with cessation of a low-dose statin and attribution to that medication.  Does report intolerances or side effects to multiple other statins but has not yet tried pravastatin and he has a family number that is on that medication.  Currently on red yeast rice, but  discussed weak statin effect, likely will need something more given his comorbidities and prior LDL.  Check labs, consider low-dose pravastatin, and if that is not tolerated refer to lipid clinic to discuss other options.  Screening for prostate cancer - Plan: PSA  Essential hypertension - Plan: CBC with Differential/Platelet  -As above.  Hypertriglyceridemia - Plan: omega-3 acid ethyl esters (LOVAZA) 1 g capsule  -Tolerating Lovaza, lipid panel and plan as above.  Gastroesophageal reflux disease, unspecified whether esophagitis present - Plan: omeprazole (PRILOSEC) 20 MG capsule  -Stable with Prilosec, dosing as above along with align/probiotic.  Meds ordered this encounter  Medications   carvedilol (COREG) 6.25 MG tablet    Sig: Take 1 tablet (6.25 mg total) by mouth 2 (two) times daily with a meal.    Dispense:  180 tablet    Refill:  2   losartan (COZAAR) 100 MG tablet    Sig: Take 1 tablet (100 mg total) by mouth daily.    Dispense:  90 tablet    Refill:  2   linagliptin (TRADJENTA) 5 MG TABS tablet    Sig: Take 1 tablet (5 mg total) by mouth daily.    Dispense:  90 tablet    Refill:  2   omega-3 acid ethyl esters (LOVAZA) 1 g capsule    Sig: Take 2 capsules (2 g total) by mouth 2 (two) times daily.    Dispense:  120 capsule    Refill:  5   omeprazole (PRILOSEC) 20 MG capsule    Sig: Take 1 capsule (20 mg total) by mouth 2 (two) times daily before a meal.    Dispense:  180 capsule    Refill:  3   Patient Instructions  Based on your concerns with other statin medications, I think it would be reasonable to consider  meeting with a lipid specialist if you are not able to tolerate any statins to decide on other medications.  We can try pravastatin depending on your cholesterol levels from today.  I will check your labs and then we can discuss low dose pravastatin.  If you do have issues taking pravastatin, then I would recommend meeting with cardiology, lipid specialist to look into alternate options.  No other med changes at this time.  If any concerns on labs I will let you know.  Take care!  Preventive Care 35-28 Years Old, Male Preventive care refers to lifestyle choices and visits with your health care provider that can promote health and wellness. Preventive care visits are also called wellness exams. What can I expect for my preventive care visit? Counseling During your preventive care visit, your health care provider may ask about your: Medical history, including: Past medical problems. Family medical history. Current health, including: Emotional well-being. Home life and relationship well-being. Sexual activity. Lifestyle, including: Alcohol, nicotine or tobacco, and drug use. Access to firearms. Diet, exercise, and sleep habits. Safety issues such as seatbelt and bike helmet use. Sunscreen use. Work and work Astronomer. Physical exam Your health care provider will check your: Height and weight. These may be used to calculate your BMI (body mass index). BMI is a measurement that tells if you are at a healthy weight. Waist circumference. This measures the distance around your waistline. This measurement also tells if you are at a healthy weight and may help predict your risk of certain diseases, such as type 2 diabetes and high blood pressure. Heart rate and blood pressure. Body temperature. Skin for abnormal spots. What immunizations do  I need?  Vaccines are usually given at various ages, according to a schedule. Your health care provider will recommend vaccines for you based on your age,  medical history, and lifestyle or other factors, such as travel or where you work. What tests do I need? Screening Your health care provider may recommend screening tests for certain conditions. This may include: Lipid and cholesterol levels. Diabetes screening. This is done by checking your blood sugar (glucose) after you have not eaten for a while (fasting). Hepatitis B test. Hepatitis C test. HIV (human immunodeficiency virus) test. STI (sexually transmitted infection) testing, if you are at risk. Lung cancer screening. Prostate cancer screening. Colorectal cancer screening. Talk with your health care provider about your test results, treatment options, and if necessary, the need for more tests. Follow these instructions at home: Eating and drinking  Eat a diet that includes fresh fruits and vegetables, whole grains, lean protein, and low-fat dairy products. Take vitamin and mineral supplements as recommended by your health care provider. Do not drink alcohol if your health care provider tells you not to drink. If you drink alcohol: Limit how much you have to 0-2 drinks a day. Know how much alcohol is in your drink. In the [place] one drink equals one 12 oz bottle of beer (355 mL), one 5 oz glass of wine (148 mL), or one 1 oz glass of hard liquor (44 mL). Lifestyle Brush your teeth every morning and night with fluoride toothpaste. Floss one time each day. Exercise for at least 30 minutes 5 or more days each week. Do not use any products that contain nicotine or tobacco. These products include cigarettes, chewing tobacco, and vaping devices, such as e-cigarettes. If you need help quitting, ask your health care provider. Do not use drugs. If you are sexually active, practice safe sex. Use a condom or other form of protection to prevent STIs. Take aspirin only as told by your health care provider. Make sure that you understand how much to take and what form to take. Work with your health  care provider to find out whether it is safe and beneficial for you to take aspirin daily. Find healthy ways to manage stress, such as: Meditation, yoga, or listening to music. Journaling. Talking to a trusted person. Spending time with friends and family. Minimize exposure to UV radiation to reduce your risk of skin cancer. Safety Always wear your seat belt while driving or riding in a vehicle. Do not drive: If you have been drinking alcohol. Do not ride with someone who has been drinking. When you are tired or distracted. While texting. If you have been using any mind-altering substances or drugs. Wear a helmet and other protective equipment during sports activities. If you have firearms in your house, make sure you follow all gun safety procedures. What's next? Go to your health care provider once a year for an annual wellness visit. Ask your health care provider how often you should have your eyes and teeth checked. Stay up to date on all vaccines. This information is not intended to replace advice given to you by your health care provider. Make sure you discuss any questions you have with your health care provider. Document Revised: 08/16/2020 Document Reviewed: 08/16/2020 Elsevier Patient Education  2024 Elsevier Inc.      Signed,   Meredith Staggers, MD [facility], [facility] 05/22/23 8:38 AM

## 2023-05-27 ENCOUNTER — Encounter: Payer: Self-pay | Admitting: Family Medicine

## 2023-05-27 ENCOUNTER — Other Ambulatory Visit: Payer: Self-pay | Admitting: Family Medicine

## 2023-05-27 DIAGNOSIS — E782 Mixed hyperlipidemia: Secondary | ICD-10-CM

## 2023-05-27 DIAGNOSIS — E1122 Type 2 diabetes mellitus with diabetic chronic kidney disease: Secondary | ICD-10-CM

## 2023-05-27 MED ORDER — PRAVASTATIN SODIUM 20 MG PO TABS: 20.0000 mg | ORAL_TABLET | Freq: Every day | ORAL | 1 refills | Status: DC

## 2023-05-27 NOTE — Progress Notes (Signed)
 See lab results.

## 2023-05-30 ENCOUNTER — Other Ambulatory Visit: Payer: Self-pay | Admitting: Family Medicine

## 2023-05-30 DIAGNOSIS — E1122 Type 2 diabetes mellitus with diabetic chronic kidney disease: Secondary | ICD-10-CM

## 2023-07-17 ENCOUNTER — Other Ambulatory Visit: Payer: Self-pay | Admitting: Family Medicine

## 2023-07-17 ENCOUNTER — Telehealth: Payer: Self-pay

## 2023-07-17 DIAGNOSIS — E1122 Type 2 diabetes mellitus with diabetic chronic kidney disease: Secondary | ICD-10-CM

## 2023-07-17 NOTE — Telephone Encounter (Signed)
 FAX RECEIVED FROM [facility] IN ERROR TO [facility] REGARDING IMMUNIZATION. IMMUNIZATION DOCUMENTED IN PATIENT'S CHART AND SENT TO SCAN. MJP,LPN

## 2023-07-17 NOTE — Telephone Encounter (Signed)
 Noted. Thanks.

## 2023-07-18 ENCOUNTER — Other Ambulatory Visit: Payer: Self-pay

## 2023-07-18 ENCOUNTER — Encounter (HOSPITAL_COMMUNITY): Payer: Self-pay

## 2023-07-18 ENCOUNTER — Encounter: Payer: Self-pay | Admitting: Family Medicine

## 2023-07-18 ENCOUNTER — Emergency Department (HOSPITAL_COMMUNITY)

## 2023-07-18 ENCOUNTER — Emergency Department (HOSPITAL_COMMUNITY)
Admission: EM | Admit: 2023-07-18 | Discharge: 2023-07-18 | Disposition: A | Discharge status: Home / Self Care | Attending: Emergency Medicine | Admitting: Emergency Medicine

## 2023-07-18 DIAGNOSIS — R1030 Lower abdominal pain, unspecified: Secondary | ICD-10-CM

## 2023-07-18 DIAGNOSIS — R1032 Left lower quadrant pain: Secondary | ICD-10-CM | POA: Diagnosis not present

## 2023-07-18 DIAGNOSIS — R110 Nausea: Secondary | ICD-10-CM | POA: Insufficient documentation

## 2023-07-18 DIAGNOSIS — R1031 Right lower quadrant pain: Secondary | ICD-10-CM | POA: Diagnosis not present

## 2023-07-18 DIAGNOSIS — Z7982 Long term (current) use of aspirin: Secondary | ICD-10-CM | POA: Diagnosis not present

## 2023-07-18 DIAGNOSIS — R739 Hyperglycemia, unspecified: Secondary | ICD-10-CM | POA: Insufficient documentation

## 2023-07-18 LAB — CBC WITH DIFFERENTIAL/PLATELET
Abs Immature Granulocytes: 0.02 10*3/uL (ref 0.00–0.07)
Basophils Absolute: 0 10*3/uL (ref 0.0–0.1)
Basophils Relative: 1 %
Eosinophils Absolute: 0.2 10*3/uL (ref 0.0–0.5)
Eosinophils Relative: 3 %
HCT: 44.5 % (ref 39.0–52.0)
Hemoglobin: 14.9 g/dL (ref 13.0–17.0)
Immature Granulocytes: 0 %
Lymphocytes Relative: 27 %
Lymphs Abs: 1.9 10*3/uL (ref 0.7–4.0)
MCH: 29 pg (ref 26.0–34.0)
MCHC: 33.5 g/dL (ref 30.0–36.0)
MCV: 86.6 fL (ref 80.0–100.0)
Monocytes Absolute: 0.5 10*3/uL (ref 0.1–1.0)
Monocytes Relative: 7 %
Neutro Abs: 4.2 10*3/uL (ref 1.7–7.7)
Neutrophils Relative %: 62 %
Platelets: 206 10*3/uL (ref 150–400)
RBC: 5.14 MIL/uL (ref 4.22–5.81)
RDW: 12.4 % (ref 11.5–15.5)
WBC: 6.8 10*3/uL (ref 4.0–10.5)
nRBC: 0 % (ref 0.0–0.2)

## 2023-07-18 LAB — COMPREHENSIVE METABOLIC PANEL WITH GFR
ALT: 20 U/L (ref 0–44)
AST: 19 U/L (ref 15–41)
Albumin: 3.9 g/dL (ref 3.5–5.0)
Alkaline Phosphatase: 58 U/L (ref 38–126)
Anion gap: 9 (ref 5–15)
BUN: 20 mg/dL (ref 8–23)
CO2: 21 mmol/L — ABNORMAL LOW (ref 22–32)
Calcium: 9.3 mg/dL (ref 8.9–10.3)
Chloride: 104 mmol/L (ref 98–111)
Creatinine, Ser: 0.89 mg/dL (ref 0.61–1.24)
GFR, Estimated: >60 mL/min (ref >60)
Glucose, Bld: 111 mg/dL — ABNORMAL HIGH (ref 70–99)
Potassium: 3.8 mmol/L (ref 3.5–5.1)
Sodium: 134 mmol/L — ABNORMAL LOW (ref 135–145)
Total Bilirubin: 0.9 mg/dL (ref 0.0–1.2)
Total Protein: 7 g/dL (ref 6.5–8.1)

## 2023-07-18 LAB — CBG MONITORING, ED: Glucose-Capillary: 141 mg/dL — ABNORMAL HIGH (ref 70–99)

## 2023-07-18 LAB — LIPASE, BLOOD: Lipase: 39 U/L (ref 11–51)

## 2023-07-18 MED ORDER — IOHEXOL 300 MG/ML  SOLN
100.0000 mL | Freq: Once | INTRAMUSCULAR | Status: AC | PRN
  Administered 2023-07-18: 100 mL via INTRAVENOUS

## 2023-07-18 MED ORDER — SODIUM CHLORIDE 0.9 % IV BOLUS
1000.0000 mL | Freq: Once | INTRAVENOUS | Status: AC
  Administered 2023-07-18: 1000 mL via INTRAVENOUS

## 2023-07-18 NOTE — Telephone Encounter (Signed)
 Patient believes it could have been a bacteria problem from sausage he received from someone due to having an upset stomach. Was eating a patty a day every day that he had high readings. Thinking it was part of his diverticulitis. States Blood sugar has went down to 137 this morning before patient ate and patient is feeling better today and did take some elder berry syrup. Patient states he is going to urgent care to be seen today just to have everything checked out.   Did discuss with Dr.Greene Verbally about patient concerns and Dr.Greene states if he is having symptoms he should be seen at Urgent Care. If asymptomatic to have patent check sugars,bring in recorded sugars to appointment next week, Make an appointment for next week.

## 2023-07-18 NOTE — Discharge Instructions (Signed)
 Your CT does not show any evidence of diverticulitis.  However there is increased stool which goes along with your symptoms of constipation.  You should take over-the-counter MiraLAX and a stool softener such as Colace to help have bowel movements.  Follow-up with your primary care physician.  If you develop worsening, continued, or recurrent abdominal pain, uncontrolled vomiting, fever, chest or back pain, or any other new/concerning symptoms then return to the ER for evaluation.

## 2023-07-18 NOTE — ED Triage Notes (Signed)
 Pt stated that he has CBG of 135 but is mostly complaining of diverticulitis pain. Pt stated he has bad abd pain mid lower abdomen

## 2023-07-18 NOTE — ED Notes (Signed)
 Patient ambulated to the restroom

## 2023-07-18 NOTE — ED Provider Notes (Signed)
 [place] EMERGENCY DEPARTMENT AT [facility] Provider Note   CSN: [id number] Arrival date & time: 07/18/23  1148     History  Chief Complaint  Patient presents with   Abdominal Pain    Shawn Ball. is a 63 y.o. male.  HPI 63 year old male with a history of prior diverticulitis presents with concern for recurrent diverticulitis.  For the past 4-5 days he has been dealing with diffuse lower abdominal pain.  It was not that bad until today when he got worse.  Over the same course of 4 or 5 days he has been dealing with hyperglycemia into the high 100s and low 200s.  No fevers or blood in the stools.  He has had some nausea but no vomiting.  Pain is moderate today.  This feels like his previous diverticulitis. Has been having some constipation.  Of note, patient states he has a allergy listed in discharge to IVP dye but states he has never actually had a reaction.  He has tolerated this in the past.  The chart shows the allergy is listed as nausea and he cannot remember if you truly got nauseated or not but specifically remembers that he never had any sort of rash, swelling, etc.  Home Medications Prior to Admission medications   Medication Sig Start Date End Date Taking? Authorizing Provider  ASPIRIN 81 PO Take 1 tablet by mouth daily.    [provider]  Biotin 5000 MCG CAPS 2 times a week 12/03/14   [provider]  brompheniramine-pseudoephedrine-DM 30-2-10 MG/5ML syrup Take 5 mLs by mouth 4 (four) times daily as needed. 05/18/22   Leath-Warren, Belen Bowers, NP  carvedilol  (COREG ) 6.25 MG tablet Take 1 tablet (6.25 mg total) by mouth 2 (two) times daily with a meal. 05/22/23   Benjiman Bras, MD  cetirizine  (ZYRTEC ) 10 MG tablet Take 1 tablet (10 mg total) by mouth daily. 10/28/19   Austine Lefort, MD  Collagen Hydrolysate, Bovine, POWD by Does not apply route. Takes Tour manager" for arthritis    [provider]  FIBER COMPLETE PO Take by  mouth. Vita Fusion fiber gummies    [provider]  fluticasone  (FLONASE ) 50 MCG/ACT nasal spray Place 2 sprays into both nostrils daily. 04/27/18   Austine Lefort, MD  Garlic Oil 1000 MG CAPS  10/02/20   [provider]  Glucosamine-Chondroitin-MSM 500-200-150 MG TABS Take by mouth.    [provider]  L-Lysine 1000 MG TABS Take by mouth.    [provider]  Lancets (FREESTYLE) lancets Use as instructed 10/28/19   Austine Lefort, MD  linagliptin  (TRADJENTA ) 5 MG TABS tablet Take 1 tablet (5 mg total) by mouth daily. 05/22/23   Benjiman Bras, MD  losartan  (COZAAR ) 100 MG tablet Take 1 tablet (100 mg total) by mouth daily. 05/22/23   Benjiman Bras, MD  MAGNESIUM OXIDE PO Take 375 mg by mouth daily.    [provider]  mometasone -formoterol (DULERA) 100-5 MCG/ACT AERO Inhale 2 puffs into the lungs 2 (two) times daily.    [provider]  Multiple Vitamins-Minerals (VISION FORMULA EYE HEALTH PO)  06/02/17   [provider]  omega-3 acid ethyl esters (LOVAZA ) 1 g capsule Take 2 capsules (2 g total) by mouth 2 (two) times daily. 05/22/23   Benjiman Bras, MD  omeprazole  (PRILOSEC) 20 MG capsule Take 1 capsule (20 mg total) by mouth 2 (two) times daily before a meal. 05/22/23  Benjiman Bras, MD  [facility] VERIO test strip USE 1 STRIP TO CHECK GLUCOSE IN THE MORNING AND AT BEDTIME 07/17/23   Benjiman Bras, MD  pravastatin  (PRAVACHOL ) 20 MG tablet Take 1 tablet (20 mg total) by mouth daily. 05/27/23   Benjiman Bras, MD  triamcinolone  cream (KENALOG ) 0.1 % Apply 1 Application topically 2 (two) times daily. 08/23/22   Benjiman Bras, MD      Allergies    Amlodipine, Codeine, Penicillins, and Sulfa antibiotics    Review of Systems   Review of Systems  Constitutional:  Negative for fever.  Gastrointestinal:  Positive for abdominal pain and nausea. Negative for blood in stool and vomiting.  Genitourinary:  Negative for  dysuria.    Physical Exam Updated Vital Signs BP (!) 137/92   Pulse 71   Temp 97.9 F (36.6 C) (Oral)   Resp 17   Ht 5\' 11"  (1.803 m)   Wt 98 kg   SpO2 95%   BMI 30.13 kg/m  Physical Exam Vitals and nursing note reviewed.  Constitutional:      General: He is not in acute distress.    Appearance: He is well-developed. He is not ill-appearing or diaphoretic.  HENT:     Head: Normocephalic and atraumatic.  Cardiovascular:     Rate and Rhythm: Normal rate and regular rhythm.     Heart sounds: Normal heart sounds.  Pulmonary:     Effort: Pulmonary effort is normal.  Abdominal:     Palpations: Abdomen is soft.     Tenderness: There is abdominal tenderness in the right lower quadrant, suprapubic area and left lower quadrant.  Skin:    General: Skin is warm and dry.  Neurological:     Mental Status: He is alert.     ED Results / Procedures / Treatments   Labs (all labs ordered are listed, but only abnormal results are displayed) Labs Reviewed  COMPREHENSIVE METABOLIC PANEL WITH GFR - Abnormal; Notable for the following components:      Result Value   Sodium 134 (*)    CO2 21 (*)    Glucose, Bld 111 (*)    All other components within normal limits  CBG MONITORING, ED - Abnormal; Notable for the following components:   Glucose-Capillary 141 (*)    All other components within normal limits  CBC WITH DIFFERENTIAL/PLATELET  LIPASE, BLOOD  URINALYSIS, ROUTINE W REFLEX MICROSCOPIC    EKG None  Radiology CT ABDOMEN PELVIS W CONTRAST Result Date: 07/18/2023 CLINICAL DATA:  Mid to lower abdominal pain. History of diverticulitis EXAM: CT ABDOMEN AND PELVIS WITH CONTRAST TECHNIQUE: Multidetector CT imaging of the abdomen and pelvis was performed using the standard protocol following bolus administration of intravenous contrast. RADIATION DOSE REDUCTION: This exam was performed according to the departmental dose-optimization program which includes automated exposure control,  adjustment of the mA and/or kV according to patient size and/or use of iterative reconstruction technique. CONTRAST:  OMNIPAQUE IOHEXOL 300 MG/ML  SOLN COMPARISON:  01/31/2022 FINDINGS: Lower chest: No acute abnormality Hepatobiliary: No focal hepatic abnormality. Gallbladder unremarkable. Pancreas: No focal abnormality or ductal dilatation. Spleen: No focal abnormality.  Normal size. Adrenals/Urinary Tract: Areas of scarring and cortical thinning bilaterally. No renal or adrenal mass. No stones or hydronephrosis. Urinary bladder unremarkable. Stomach/Bowel: Sigmoid diverticulosis. No evidence of active diverticulitis. Moderate stool burden throughout the colon. Stomach and small bowel decompressed. No obstruction or inflammatory process. Vascular/Lymphatic: Aortic atherosclerosis. No evidence of aneurysm or adenopathy. Reproductive: No  visible focal abnormality. Other: No free fluid or free air. Musculoskeletal: No acute bony abnormality. IMPRESSION: Sigmoid diverticulosis.  No active diverticulitis. Moderate stool burden. No acute findings. Electronically Signed   By: Janeece Mechanic M.D.   On: 07/18/2023 17:40    Procedures Procedures    Medications Ordered in ED Medications  sodium chloride  0.9 % bolus 1,000 mL (1,000 mLs Intravenous Bolus 07/18/23 1629)  iohexol (OMNIPAQUE) 300 MG/ML solution 100 mL (100 mLs Intravenous Contrast Given 07/18/23 1715)    ED Course/ Medical Decision Making/ A&P                                 Medical Decision Making Amount and/or Complexity of Data Reviewed Labs: ordered.    Details: Normal WBC Radiology: ordered and independent interpretation performed.    Details: No diverticulitis  Risk Prescription drug management.   Patient presents with lower abdominal pain and concern for diverticulitis.  He has minimal tenderness on exam.  CT is reassuring with no evidence of diverticulitis or complication.  There is an increase stool burden and he clinically  has been complaining of some constipation, this could be contributing to his discomfort.  Will advise him to use over-the-counter medicine such as MiraLAX and Colace in addition to his fiber supplementation has been doing.  He has also been worried about hyperglycemia.  Overall his glucose has been in around the 200s.  Currently his glucose is 111 on the CMP.  Will have him follow-up with PCP for possible outpatient adjustment of his medications.  He was given a bolus of fluids here.  Will discharge home with return precautions.        Final Clinical Impression(s) / ED Diagnoses Final diagnoses:  Lower abdominal pain    Rx / DC Orders ED Discharge Orders     None         Jerilynn Montenegro, MD 07/18/23 1906

## 2023-07-18 NOTE — Telephone Encounter (Signed)
 Noted.

## 2023-07-24 ENCOUNTER — Other Ambulatory Visit (INDEPENDENT_AMBULATORY_CARE_PROVIDER_SITE_OTHER)

## 2023-07-24 ENCOUNTER — Ambulatory Visit: Admitting: Family Medicine

## 2023-07-24 ENCOUNTER — Ambulatory Visit (INDEPENDENT_AMBULATORY_CARE_PROVIDER_SITE_OTHER)
Admission: RE | Admit: 2023-07-24 | Discharge: 2023-07-24 | Disposition: A | Discharge status: Home / Self Care | Source: Ambulatory Visit | Attending: Family Medicine | Admitting: Family Medicine

## 2023-07-24 ENCOUNTER — Ambulatory Visit: Payer: Self-pay | Admitting: Family Medicine

## 2023-07-24 ENCOUNTER — Encounter: Payer: Self-pay | Admitting: Family Medicine

## 2023-07-24 VITALS — BP 114/80 | HR 78 | Temp 98.4°F | Ht 71.0 in | Wt 214.1 lb

## 2023-07-24 DIAGNOSIS — E1122 Type 2 diabetes mellitus with diabetic chronic kidney disease: Secondary | ICD-10-CM | POA: Diagnosis not present

## 2023-07-24 DIAGNOSIS — R739 Hyperglycemia, unspecified: Secondary | ICD-10-CM | POA: Diagnosis not present

## 2023-07-24 DIAGNOSIS — R1030 Lower abdominal pain, unspecified: Secondary | ICD-10-CM

## 2023-07-24 DIAGNOSIS — R110 Nausea: Secondary | ICD-10-CM | POA: Diagnosis not present

## 2023-07-24 DIAGNOSIS — N182 Chronic kidney disease, stage 2 (mild): Secondary | ICD-10-CM

## 2023-07-24 DIAGNOSIS — K5900 Constipation, unspecified: Secondary | ICD-10-CM

## 2023-07-24 LAB — COMPREHENSIVE METABOLIC PANEL WITH GFR
ALT: 18 U/L (ref 0–53)
AST: 17 U/L (ref 0–37)
Albumin: 4.5 g/dL (ref 3.5–5.2)
Alkaline Phosphatase: 67 U/L (ref 39–117)
BUN: 15 mg/dL (ref 6–23)
CO2: 29 meq/L (ref 19–32)
Calcium: 9.4 mg/dL (ref 8.4–10.5)
Chloride: 104 meq/L (ref 96–112)
Creatinine, Ser: 1.14 mg/dL (ref 0.40–1.50)
GFR: 69.42 mL/min (ref >60.00)
Glucose, Bld: 118 mg/dL — ABNORMAL HIGH (ref 70–99)
Potassium: 4.6 meq/L (ref 3.5–5.1)
Sodium: 138 meq/L (ref 135–145)
Total Bilirubin: 0.5 mg/dL (ref 0.2–1.2)
Total Protein: 7.5 g/dL (ref 6.0–8.3)

## 2023-07-24 LAB — BASIC METABOLIC PANEL WITH GFR
BUN: 15 mg/dL (ref 6–23)
CO2: 29 meq/L (ref 19–32)
Calcium: 9.4 mg/dL (ref 8.4–10.5)
Chloride: 104 meq/L (ref 96–112)
Creatinine, Ser: 1.14 mg/dL (ref 0.40–1.50)
GFR: 69.42 mL/min (ref >60.00)
Glucose, Bld: 118 mg/dL — ABNORMAL HIGH (ref 70–99)
Potassium: 4.6 meq/L (ref 3.5–5.1)
Sodium: 138 meq/L (ref 135–145)

## 2023-07-24 LAB — CBC
HCT: 45.5 % (ref 39.0–52.0)
Hemoglobin: 15.3 g/dL (ref 13.0–17.0)
MCHC: 33.7 g/dL (ref 30.0–36.0)
MCV: 85.5 fl (ref 78.0–100.0)
Platelets: 249 10*3/uL (ref 150.0–400.0)
RBC: 5.32 Mil/uL (ref 4.22–5.81)
RDW: 13.6 % (ref 11.5–15.5)
WBC: 6.4 10*3/uL (ref 4.0–10.5)

## 2023-07-24 LAB — LIPASE: Lipase: 36 U/L (ref 11.0–59.0)

## 2023-07-24 LAB — HM DIABETES EYE EXAM

## 2023-07-24 NOTE — Patient Instructions (Signed)
 Please have x-ray and lab work performed at the [facility] location below.  Glad to hear symptoms have somewhat improved but I will check some labs and follow-up labs from the ER.  CT scan in the ER was reassuring.  Constipation can sometimes cause your symptoms.  Okay to continue Colace, Metamucil, but could consider retrying the MiraLAX, potentially may need to take that for few days for it to be most effective.  If any concerns on labs or x-ray today I will let you know.  Follow-up if you are not improving in the next week to 10 days, sooner if worse.  I am checking a blood sugar test as well, but hope to see those numbers continue to improve as your other symptoms improved.  Let me know if those start to spike again.  Hang in there!  Abdominal Pain, Adult  Pain in the abdomen (abdominal pain) can be caused by many things. In most cases, it gets better with no treatment or by being treated at home. But in some cases, it can be serious. Your health care provider will ask questions about your medical history and do a physical exam to try to figure out what is causing your pain. Follow these instructions at home: Medicines Take over-the-counter and prescription medicines only as told by your provider. Do not take medicines that help you poop (laxatives) unless told by your provider. General instructions Watch your condition for any changes. Drink enough fluid to keep your pee (urine) pale yellow. Contact a health care provider if: Your pain changes, gets worse, or lasts longer than expected. You have severe cramping or bloating in your abdomen, or you vomit. Your pain gets worse with meals, after eating, or with certain foods. You are constipated or have diarrhea for more than 2-3 days. You are not hungry, or you lose weight without trying. You have signs of dehydration. These may include: Dark pee, very little pee, or no pee. Cracked lips or dry mouth. Sleepiness or weakness. You have pain  when you pee (urinate) or poop. Your abdominal pain wakes you up at night. You have blood in your pee. You have a fever. Get help right away if: You cannot stop vomiting. Your pain is only in one part of the abdomen. Pain on the right side could be caused by appendicitis. You have bloody or black poop (stool), or poop that looks like tar. You have trouble breathing. You have chest pain. These symptoms may be an emergency. Get help right away. Call 911. Do not wait to see if the symptoms will go away. Do not drive yourself to the hospital. This information is not intended to replace advice given to you by your health care provider. Make sure you discuss any questions you have with your health care provider. Document Revised: 12/05/2021 Document Reviewed: 12/05/2021 Elsevier Patient Education  2024 [employer].

## 2023-07-24 NOTE — Progress Notes (Signed)
 Subjective:  Patient ID: Shawn Ball., male    DOB: [birth year]  Age: 63 y.o. MRN: [id number]  CC:  Chief Complaint  Patient presents with   Abdominal Pain    On and off going issue with stomach pain. Has recently been having stomach pain for about two weeks. More in the lower abdominal area. CBS being in the high ( 150, to 170). Pt been taking miralax and metamucil. Due to this issue pt has have lost of appetite. Pain described as tooth aching pain.    HPI Shawn Ball. presents for   Abdominal pain Noted for the past 2 weeks.  Lower abdomen. Decreased appetite due to this discomfort.  Aching pain. He was seen in the ER May 16.  Concern for possible diverticulitis, minimal tenderness on exam at that time, CT was reassuring with no evidence of diverticulitis or complication.  There was an increased stool burden and constipation thought to be possibly contributing to his discomfort.  Recommended over-the-counter MiraLAX and Colace in addition to fiber supplement he had been already using. Lipase, cbc normal.  Na 134, glucose 111, co2 21.   Since ER visit - slight improved since ER visit.  Took miralax - used 2 packets. Did not feel like it worked well.  Taking colace 3 per day, metamucil 1-2 times per day.  Bowels moving better, but not back to normal. Prior had BM once per day after coffee. Now is every other day, and less quantity.  No fever.  No vomiting, but still some nausea. Still decreased appetite. Feels like issue after eating some homemade sausage. Had eaten jalapenos.  No dysuria. No change in urinary symptoms.   Hyperglycemia with diabetes Also discussed at his ER visit with blood sugars in the 200s.  Glucose was 111 on CMP in the ER.  Bolus of fluids were given in ER. History of diabetes treated with Tradjenta  5 mg, readings in the 120-160 range at his March visit. Still some elevated readings - lowest reading 141, 158 last night, 240 was high about 4 days  ago. No 200's since.    Lab Results  Component Value Date   HGBA1C 6.6 (H) 05/22/2023    History Patient Active Problem List   Diagnosis Date Noted   Acute viral conjunctivitis of right eye 08/28/2020   Routine general medical examination at a health care facility 05/01/2020   Hyperlipidemia LDL goal <70 03/15/2020   Hypertriglyceridemia 03/15/2020   Diabetes mellitus without complication (HCC)    Benign essential HTN 08/25/2019   Past Medical History:  Diagnosis Date   Allergy    Arthritis    Chronic bronchitis    Chronic pain    per pt/ right shoulder pain, left leg and ankle   CKD (chronic kidney disease) stage 3, GFR 30-59 ml/min (HCC)    Costochondral chest pain    due to torn tendons.   CTS (carpal tunnel syndrome)    right hand   Diabetes mellitus without complication (HCC)    prediabetes/ no meds   Diverticulitis    GERD (gastroesophageal reflux disease)    History of anal fissures    Hyperlipemia    Hypertension    Rectal bleeding    occasional   Seasonal allergies    Past Surgical History:  Procedure Laterality Date   ANKLE FRACTURE SURGERY  2005 / 2007   left ankle and  left leg/ have screws in leg and ankle   CARPAL TUNNEL RELEASE Left  07/29/2019   Procedure: CARPAL TUNNEL RELEASE;  Surgeon: Lyanne Sample, MD;  Location: [facility];  Service: Orthopedics;  Laterality: Left;  FOREARM BLOCK   EXAMINATION UNDER ANESTHESIA N/A 12/15/2012   Procedure: EXAM UNDER ANESTHESIA;  Surgeon: Fran Imus, MD;  Location: [facility];  Service: General;  Laterality: N/A;   FRACTURE SURGERY  2005 and 2007   lt ankle x2   HEMORRHOIDECTOMY WITH HEMORRHOID BANDING N/A 12/15/2012   Procedure: EXCISIONAL HEMORRHOIDECTOMY WITH HEMORRHOID BANDING;  Surgeon: Fran Imus, MD;  Location: [facility];  Service: General;  Laterality: N/A;   rotator cuff surgery      x 2/ right shoulder   TRIGGER FINGER RELEASE  07/23/2011   Procedure:  Right hand- RELEASE TRIGGER FINGER/A-1 PULLEY;  Surgeon: Amelie Baize., MD;  Location: [facility];  Service: Orthopedics;  Laterality: Right;   WRIST FRACTURE SURGERY  1984   lt with bone graft   WRIST SURGERY     left wrist   Allergies  Allergen Reactions   Amlodipine Other (See Comments)    Per pt - damage to kidney's   Codeine Nausea And Vomiting   Penicillins Nausea And Vomiting    Patient states he has taken Amoxicillin  without issue recently   Sulfa Antibiotics Nausea Only    nauseated   Prior to Admission medications   Medication Sig Start Date End Date Taking? Authorizing Provider  ASPIRIN 81 PO Take 1 tablet by mouth daily.   Yes [provider]  Biotin 5000 MCG CAPS 2 times a week 12/03/14  Yes [provider]  brompheniramine-pseudoephedrine-DM 30-2-10 MG/5ML syrup Take 5 mLs by mouth 4 (four) times daily as needed. 05/18/22  Yes Leath-Warren, Belen Bowers, NP  carvedilol  (COREG ) 6.25 MG tablet Take 1 tablet (6.25 mg total) by mouth 2 (two) times daily with a meal. 05/22/23  Yes Benjiman Bras, MD  cetirizine  (ZYRTEC ) 10 MG tablet Take 1 tablet (10 mg total) by mouth daily. 10/28/19  Yes Austine Lefort, MD  Collagen Hydrolysate, Bovine, POWD by Does not apply route. Takes Tour manager" for arthritis   Yes [provider]  FIBER COMPLETE PO Take by mouth. Vita Fusion fiber gummies   Yes [provider]  fluticasone  (FLONASE ) 50 MCG/ACT nasal spray Place 2 sprays into both nostrils daily. 04/27/18  Yes Austine Lefort, MD  Garlic Oil 1000 MG CAPS  10/02/20  Yes [provider]  Glucosamine-Chondroitin-MSM 500-200-150 MG TABS Take by mouth.   Yes [provider]  L-Lysine 1000 MG TABS Take by mouth.   Yes [provider]  Lancets (FREESTYLE) lancets Use as instructed 10/28/19  Yes Austine Lefort, MD  linagliptin  (TRADJENTA ) 5 MG TABS tablet Take 1 tablet (5 mg total) by mouth daily. 05/22/23  Yes  Benjiman Bras, MD  losartan  (COZAAR ) 100 MG tablet Take 1 tablet (100 mg total) by mouth daily. 05/22/23  Yes Benjiman Bras, MD  MAGNESIUM OXIDE PO Take 375 mg by mouth daily.   Yes [provider]  mometasone -formoterol (DULERA) 100-5 MCG/ACT AERO Inhale 2 puffs into the lungs 2 (two) times daily.   Yes [provider]  Multiple Vitamins-Minerals (VISION FORMULA EYE HEALTH PO)  06/02/17  Yes [provider]  omega-3 acid ethyl esters (LOVAZA ) 1 g capsule Take 2 capsules (2 g total) by mouth 2 (two) times daily. 05/22/23  Yes Benjiman Bras, MD  omeprazole  (PRILOSEC) 20 MG capsule  Take 1 capsule (20 mg total) by mouth 2 (two) times daily before a meal. 05/22/23  Yes Benjiman Bras, MD  [facility] VERIO test strip USE 1 STRIP TO CHECK GLUCOSE IN THE MORNING AND AT BEDTIME 07/17/23  Yes Benjiman Bras, MD  pravastatin  (PRAVACHOL ) 20 MG tablet Take 1 tablet (20 mg total) by mouth daily. 05/27/23  Yes Benjiman Bras, MD  triamcinolone  cream (KENALOG ) 0.1 % Apply 1 Application topically 2 (two) times daily. 08/23/22  Yes Benjiman Bras, MD   Social History   Socioeconomic History   Marital status: Divorced    Spouse name: Not on file   Number of children: 1   Years of education: Not on file   Highest education level: Some college, no degree  Occupational History   Occupation: disabled  Tobacco Use   Smoking status: Never   Smokeless tobacco: Former    Types: Chew   Tobacco comments:    Quit age 9 y.o  Vaping Use   Vaping status: Never Used  Substance and Sexual Activity   Alcohol use: No   Drug use: No   Sexual activity: Not Currently  Other Topics Concern   Not on file  Social History Narrative   Not on file   Social Drivers of Health   Financial Resource Strain: Low Risk  (05/21/2023)   Overall Financial Resource Strain (CARDIA)    Difficulty of Paying Living Expenses: Not hard at all  Food Insecurity: No Food Insecurity (05/21/2023)    Hunger Vital Sign    Worried About Running Out of Food in the Last Year: Never true    Ran Out of Food in the Last Year: Never true  Transportation Needs: No Transportation Needs (05/21/2023)   PRAPARE - Administrator, Civil Service (Medical): No    Lack of Transportation (Non-Medical): No  Physical Activity: Unknown (05/21/2023)   Exercise Vital Sign    Days of Exercise per Week: 0 days    Minutes of Exercise per Session: Not on file  Stress: No Stress Concern Present (05/21/2023)   [employer] of Occupational Health - Occupational Stress Questionnaire    Feeling of Stress : Not at all  Social Connections: Moderately Integrated (05/21/2023)   Social Connection and Isolation Panel [NHANES]    Frequency of Communication with Friends and Family: Twice a week    Frequency of Social Gatherings with Friends and Family: Twice a week    Attends Religious Services: More than 4 times per year    Active Member of [employer] or Organizations: Yes    Attends Engineer, structural: More than 4 times per year    Marital Status: Divorced  Catering manager Violence: Not on file    Review of Systems   Objective:   Vitals:   07/24/23 1120  BP: 114/80  Pulse: 78  Temp: 98.4 F (36.9 C)  TempSrc: Temporal  SpO2: 95%  Weight: 214 lb 2 oz (97.1 kg)  Height: 5\' 11"  (1.803 m)     Physical Exam Vitals reviewed.  Constitutional:      General: He is not in acute distress.    Appearance: He is well-developed. He is not ill-appearing, toxic-appearing or diaphoretic.  HENT:     Head: Normocephalic and atraumatic.  Neck:     Vascular: No carotid bruit or JVD.  Cardiovascular:     Rate and Rhythm: Normal rate and regular rhythm.     Heart sounds: Normal heart sounds. No murmur  heard. Pulmonary:     Effort: Pulmonary effort is normal.     Breath sounds: Normal breath sounds. No rales.  Abdominal:     General: There is no distension.     Tenderness: There is abdominal  tenderness (Minimal slight discomfort diffusely lower abdomen to left mid abdomen.  No rebound or guarding.). There is no guarding or rebound.  Musculoskeletal:     Right lower leg: No edema.     Left lower leg: No edema.  Skin:    General: Skin is warm and dry.  Neurological:     Mental Status: He is alert and oriented to person, place, and time.  Psychiatric:        Mood and Affect: Mood normal.        Assessment & Plan:  Aniken Monestime. is a 63 y.o. male . Lower abdominal pain - Plan: POCT urinalysis dipstick, DG Abd 1 View, Comp Met (CMET), Lipase  Constipation, unspecified constipation type  Hyperglycemia - Plan: CBC  Type 2 diabetes mellitus with stage 2 chronic kidney disease, without long-term current use of insulin (HCC) - Plan: Fructosamine, Basic metabolic panel with GFR  Nausea - Plan: CBC, DG Abd 1 View, Comp Met (CMET), Lipase  Lower abdominal pain as above, reassuring CT abdomen pelvis and ER visit.  Few borderline labs including hyperglycemia but only 111 in the ER, lower than his home readings.  Borderline sodium.  Increase stool burden, possible constipation contributing to his lower pain and nausea, decreased appetite.  Will repeat lipase although normal previously along with CMP and CBC given persistent symptoms.  Stool regimen discussed, would consider trying MiraLAX again if persistent constipation, and we will check 1 view x-ray with labs today to evaluate stool burden.  If not continuing to improve in the next week to 10 days or worsening sooner, ER/RTC precautions given.  Hyperglycemia with lower readings past few days.  Check fructosamine, BMP as above.  No med changes for now.  RTC precautions if worsening control again.  No orders of the defined types were placed in this encounter.  Patient Instructions  Please have x-ray and lab work performed at the [facility] location below.  Glad to hear symptoms have somewhat improved but I will check some  labs and follow-up labs from the ER.  CT scan in the ER was reassuring.  Constipation can sometimes cause your symptoms.  Okay to continue Colace, Metamucil, but could consider retrying the MiraLAX, potentially may need to take that for few days for it to be most effective.  If any concerns on labs or x-ray today I will let you know.  Follow-up if you are not improving in the next week to 10 days, sooner if worse.  I am checking a blood sugar test as well, but hope to see those numbers continue to improve as your other symptoms improved.  Let me know if those start to spike again.  Hang in there!  Abdominal Pain, Adult  Pain in the abdomen (abdominal pain) can be caused by many things. In most cases, it gets better with no treatment or by being treated at home. But in some cases, it can be serious. Your health care provider will ask questions about your medical history and do a physical exam to try to figure out what is causing your pain. Follow these instructions at home: Medicines Take over-the-counter and prescription medicines only as told by your provider. Do not take medicines that help you poop (  laxatives) unless told by your provider. General instructions Watch your condition for any changes. Drink enough fluid to keep your pee (urine) pale yellow. Contact a health care provider if: Your pain changes, gets worse, or lasts longer than expected. You have severe cramping or bloating in your abdomen, or you vomit. Your pain gets worse with meals, after eating, or with certain foods. You are constipated or have diarrhea for more than 2-3 days. You are not hungry, or you lose weight without trying. You have signs of dehydration. These may include: Dark pee, very little pee, or no pee. Cracked lips or dry mouth. Sleepiness or weakness. You have pain when you pee (urinate) or poop. Your abdominal pain wakes you up at night. You have blood in your pee. You have a fever. Get help right away  if: You cannot stop vomiting. Your pain is only in one part of the abdomen. Pain on the right side could be caused by appendicitis. You have bloody or black poop (stool), or poop that looks like tar. You have trouble breathing. You have chest pain. These symptoms may be an emergency. Get help right away. Call 911. Do not wait to see if the symptoms will go away. Do not drive yourself to the hospital. This information is not intended to replace advice given to you by your health care provider. Make sure you discuss any questions you have with your health care provider. Document Revised: 12/05/2021 Document Reviewed: 12/05/2021 Elsevier Patient Education  2024 Elsevier Inc.    Signed,   Caro Christmas, MD [facility], [facility] 07/24/23 12:37 PM

## 2023-07-26 LAB — FRUCTOSAMINE: Fructosamine: 253 umol/L (ref 205–285)

## 2023-07-29 ENCOUNTER — Encounter: Payer: Self-pay | Admitting: Family Medicine

## 2023-08-01 ENCOUNTER — Encounter: Payer: Self-pay | Admitting: Family Medicine

## 2023-08-01 ENCOUNTER — Ambulatory Visit: Payer: Self-pay

## 2023-08-01 NOTE — Telephone Encounter (Signed)
 Phone note reviewed.  Patient called,  no answer - left message that I will leave instructions through MyChart.  Not sure an x-ray would help at this time.  MiraLAX twice per day seems to be causing some improved movement, although still some hard stools.  May take another day or 2 of treatment.  Option of ER evaluation over the weekend if he is unable to have a bowel movement or worsening symptoms.  As long as he is producing stool less likely fecal impaction.

## 2023-08-01 NOTE — Telephone Encounter (Signed)
 See phone message - sent mychart message with plan.

## 2023-08-01 NOTE — Telephone Encounter (Signed)
  Chief Complaint: constipation   Disposition: [] ED /[] Urgent Care (no appt availability in office) / [] Appointment(In office/virtual)/ []  [facility]/ [x] Home Care/ [] Refused Recommended Disposition /[] [facility]/ [x]  Follow-up with PCP Additional Notes:             Reason for Disposition  [1] Uses laxative (e.g., PEG / Miralax, Milk of Magnesia) or enema AND [2] more than once a month  Answer Assessment - Initial Assessment Questions 1. STOOL PATTERN OR FREQUENCY: "How often do you have a bowel movement (BM)?"  (Normal range: 3 times a day to every 3 days)  "When was your last BM?"       Used to be daily but not sure 2. STRAINING: "Do you have to strain to have a BM?"      Denies  3. RECTAL PAIN: "Does your rectum hurt when the stool comes out?" If Yes, ask: "Do you have hemorrhoids? How bad is the pain?"  (Scale 1-10; or mild, moderate, severe)     Denies  4. STOOL COMPOSITION: "Are the stools hard?"      Not sure  5. BLOOD ON STOOLS: "Has there been any blood on the toilet tissue or on the surface of the BM?" If Yes, ask: "When was the last time?"     Denies  6. CHRONIC CONSTIPATION: "Is this a new problem for you?"  If No, ask: "How long have you had this problem?" (days, weeks, months)      No  7. CHANGES IN DIET OR HYDRATION: "Have there been any recent changes in your diet?" "How much fluids are you drinking on a daily basis?"  "How much have you had to drink today?"     Denies  8. MEDICINES: "Have you been taking any new medicines?" "Are you taking any narcotic pain medicines?" (e.g., Dilaudid , morphine, Percocet, Vicodin)     Pravastatin   9. LAXATIVES: "Have you been using any stool softeners, laxatives, or enemas?"  If Yes, ask "What, how often, and when was the last time?"     Miralax and ducolax 10. ACTIVITY:  "How much walking do you do every day?"  "Has your activity level decreased in the past week?"        Exercise with grandson  11.  CAUSE: "What do you think is causing the constipation?"        Not sure  12. OTHER SYMPTOMS: "Do you have any other symptoms?" (e.g., abdomen pain, bloating, fever, vomiting)       Bloating in stoamch  13. MEDICAL HISTORY: "Do you have a history of hemorrhoids, rectal fissures, or rectal surgery or rectal abscess?"         Surgery  Protocols used: Constipation-A-AH

## 2023-08-01 NOTE — Telephone Encounter (Signed)
  Chief Complaint: constipation   Disposition: [] ED /[] Urgent Care (no appt availability in office) / [] Appointment(In office/virtual)/ []  [facility]/ [x] Home Care/ [] Refused Recommended Disposition /[] [facility]/ [x]  Follow-up with PCP Additional Notes: Pt calling with concerns of constant constipation over last several weeks. Pt has history of stomach issues but has "never had constipation like this." Pt is taking miralax and ducolax but bowels are "moving little." Pt has been messaging Dr Ester Helms through [place] and wants to know if he wants to order another xray. Pt had BM today but not much. This all started 2-3 weeks ago. Pt is concerned prescriptions could be causing this. Pt started Pravastatin  in March and wonders  if correlated. Pt stated he was taking miralax and ducolax at same and asked if ok. RN suggested he not take at same time. RN suggested pt eat raw fruit/veggies and to stay away from boxed/processed food. RN suggested to drink warm prune juice with a little butter mixed in. Pt didn't want to make appt at this time. Pt is waiting to hear from PCP on next steps. Please advise.

## 2023-08-04 NOTE — Telephone Encounter (Signed)
 Pt reviewed results through MyChart.  Ilona Malta, CMA

## 2023-09-04 ENCOUNTER — Other Ambulatory Visit: Payer: Self-pay | Admitting: Family Medicine

## 2023-09-04 DIAGNOSIS — E1122 Type 2 diabetes mellitus with diabetic chronic kidney disease: Secondary | ICD-10-CM

## 2023-09-07 ENCOUNTER — Ambulatory Visit: Admission: EM | Admit: 2023-09-07 | Discharge: 2023-09-07 | Disposition: A | Discharge status: Home / Self Care

## 2023-09-07 ENCOUNTER — Encounter: Payer: Self-pay | Admitting: Emergency Medicine

## 2023-09-07 DIAGNOSIS — T7840XA Allergy, unspecified, initial encounter: Secondary | ICD-10-CM | POA: Diagnosis not present

## 2023-09-07 DIAGNOSIS — H5789 Other specified disorders of eye and adnexa: Secondary | ICD-10-CM | POA: Diagnosis not present

## 2023-09-07 DIAGNOSIS — T63441A Toxic effect of venom of bees, accidental (unintentional), initial encounter: Secondary | ICD-10-CM

## 2023-09-07 MED ORDER — DEXAMETHASONE SODIUM PHOSPHATE 10 MG/ML IJ SOLN
10.0000 mg | INTRAMUSCULAR | Status: AC
  Administered 2023-09-07: 10 mg via INTRAMUSCULAR

## 2023-09-07 MED ORDER — PREDNISONE 20 MG PO TABS: 20.0000 mg | ORAL_TABLET | Freq: Every day | ORAL | 0 refills | Status: AC

## 2023-09-07 NOTE — Discharge Instructions (Signed)
 You were given an injection of Decadron  10 mg.  Start the prednisone  on 09/08/2023. You may take over-the-counter Tylenol  as needed for pain, fever, or general discomfort. Apply cool compresses to the affected area to help with pain or swelling. You may continue over-the-counter antihistamines such as Benadryl or Zyrtec  as directed. Go to the emergency department immediately if you experience increased facial swelling, redness, or other concerns. Follow-up as needed.

## 2023-09-07 NOTE — ED Triage Notes (Signed)
 Stung by bee around left eye last night.   Redness and swelling around area.   Took benadryl around 4am this morning.

## 2023-09-07 NOTE — ED Provider Notes (Addendum)
 [facility]    CSN: [id number] Arrival date & time: 09/07/23  1033      History   Chief Complaint No chief complaint on file.   HPI Shawn Ball. is a 63 y.o. male.   The history is provided by the patient.   Patient presents for complaints of redness and swelling around the left eye after he was stung by honeybee 1 day ago on the left upper eyelid.  Patient states that he has taken Benadryl 50 mg on 2 occasions since the bee sting.  He denies visual changes, eye pain, fever, chills, chest pain, shortness of breath, tongue swelling, lip swelling, scratchy throat, cough or wheezing.  Patient reports that he was stung previously by a yellowjacket over the past 1 to 2 weeks.  Patient denies prior history of anaphylaxis.  Patient reports that he is diabetic.  Last A1c was 6.6.  Past Medical History:  Diagnosis Date   Allergy    Arthritis    Chronic bronchitis    Chronic pain    per pt/ right shoulder pain, left leg and ankle   CKD (chronic kidney disease) stage 3, GFR 30-59 ml/min (HCC)    Costochondral chest pain    due to torn tendons.   CTS (carpal tunnel syndrome)    right hand   Diabetes mellitus without complication (HCC)    prediabetes/ no meds   Diverticulitis    GERD (gastroesophageal reflux disease)    History of anal fissures    Hyperlipemia    Hypertension    Rectal bleeding    occasional   Seasonal allergies     Patient Active Problem List   Diagnosis Date Noted   Acute viral conjunctivitis of right eye 08/28/2020   Routine general medical examination at a health care facility 05/01/2020   Hyperlipidemia LDL goal <70 03/15/2020   Hypertriglyceridemia 03/15/2020   Diabetes mellitus without complication (HCC)    Benign essential HTN 08/25/2019    Past Surgical History:  Procedure Laterality Date   ANKLE FRACTURE SURGERY  2005 / 2007   left ankle and  left leg/ have screws in leg and ankle   CARPAL TUNNEL RELEASE Left 07/29/2019    Procedure: CARPAL TUNNEL RELEASE;  Surgeon: Murrell Kuba, MD;  Location: [facility];  Service: Orthopedics;  Laterality: Left;  FOREARM BLOCK   EXAMINATION UNDER ANESTHESIA N/A 12/15/2012   Procedure: EXAM UNDER ANESTHESIA;  Surgeon: Camellia CHRISTELLA Blush, MD;  Location: [facility];  Service: General;  Laterality: N/A;   FRACTURE SURGERY  2005 and 2007   lt ankle x2   HEMORRHOIDECTOMY WITH HEMORRHOID BANDING N/A 12/15/2012   Procedure: EXCISIONAL HEMORRHOIDECTOMY WITH HEMORRHOID BANDING;  Surgeon: Camellia CHRISTELLA Blush, MD;  Location: [facility];  Service: General;  Laterality: N/A;   rotator cuff surgery      x 2/ right shoulder   TRIGGER FINGER RELEASE  07/23/2011   Procedure: Right hand- RELEASE TRIGGER FINGER/A-1 PULLEY;  Surgeon: Lamar LULLA Leonor Mickey., MD;  Location: [facility];  Service: Orthopedics;  Laterality: Right;   WRIST FRACTURE SURGERY  1984   lt with bone graft   WRIST SURGERY     left wrist       Home Medications    Prior to Admission medications   Medication Sig Start Date End Date Taking? Authorizing Provider  Cinnamon 500 MG TABS Take by mouth.   Yes [provider]  Grapefruit Oil OIL by  Does not apply route.   Yes [provider]  ASPIRIN 81 PO Take 1 tablet by mouth daily.    [provider]  Biotin 5000 MCG CAPS 2 times a week 12/03/14   [provider]  carvedilol  (COREG ) 6.25 MG tablet Take 1 tablet (6.25 mg total) by mouth 2 (two) times daily with a meal. 05/22/23   Levora Reyes SAUNDERS, MD  cetirizine  (ZYRTEC ) 10 MG tablet Take 1 tablet (10 mg total) by mouth daily. 10/28/19   Duanne Butler DASEN, MD  Collagen Hydrolysate, Bovine, POWD by Does not apply route. Tax inspector for arthritis    [provider]  FIBER COMPLETE PO Take by mouth. Vita Fusion fiber gummies    [provider]  fluticasone  (FLONASE ) 50 MCG/ACT nasal spray Place 2 sprays into both nostrils daily.  04/27/18   Duanne Butler DASEN, MD  Garlic Oil 1000 MG CAPS  10/02/20   [provider]  Glucosamine-Chondroitin-MSM 500-200-150 MG TABS Take by mouth.    [provider]  L-Lysine 1000 MG TABS Take by mouth.    [provider]  Lancets (FREESTYLE) lancets Use as instructed 10/28/19   Duanne Butler DASEN, MD  linagliptin  (TRADJENTA ) 5 MG TABS tablet Take 1 tablet (5 mg total) by mouth daily. 05/22/23   Levora Reyes SAUNDERS, MD  losartan  (COZAAR ) 100 MG tablet Take 1 tablet (100 mg total) by mouth daily. 05/22/23   Levora Reyes SAUNDERS, MD  MAGNESIUM OXIDE PO Take 375 mg by mouth daily.    [provider]  mometasone -formoterol (DULERA) 100-5 MCG/ACT AERO Inhale 2 puffs into the lungs 2 (two) times daily.    [provider]  Multiple Vitamins-Minerals (VISION FORMULA EYE HEALTH PO)  06/02/17   [provider]  omega-3 acid ethyl esters (LOVAZA ) 1 g capsule Take 2 capsules (2 g total) by mouth 2 (two) times daily. 05/22/23   Levora Reyes SAUNDERS, MD  omeprazole  (PRILOSEC) 20 MG capsule Take 1 capsule (20 mg total) by mouth 2 (two) times daily before a meal. 05/22/23   Levora Reyes SAUNDERS, MD  [employer] VERIO test strip USE 1 STRIP TO CHECK GLUCOSE IN THE MORNING AND AT BEDTIME 09/04/23   Levora Reyes SAUNDERS, MD  pravastatin  (PRAVACHOL ) 20 MG tablet Take 1 tablet (20 mg total) by mouth daily. 05/27/23   Levora Reyes SAUNDERS, MD    Family History Family History  Problem Relation Age of Onset   Heart disease Mother    Hyperlipidemia Mother    Diabetes Mother    Macular degeneration Mother    Cancer Father        skin   Macular degeneration Sister    Diabetes Brother    Colon cancer Neg Hx    Stomach cancer Neg Hx    Esophageal cancer Neg Hx    Rectal cancer Neg Hx     Social History Social History   Tobacco Use   Smoking status: Never   Smokeless tobacco: Former    Types: Chew   Tobacco comments:    Quit age 49 y.o  Vaping Use   Vaping status: Never Used   Substance Use Topics   Alcohol use: No   Drug use: No     Allergies   Amlodipine, Codeine, Penicillins, and Sulfa antibiotics   Review of Systems Review of Systems Per HPI  Physical Exam Triage Vital Signs ED Triage Vitals  Encounter Vitals Group     BP 09/07/23 1041 132/84  Girls Systolic BP Percentile --      Girls Diastolic BP Percentile --      Boys Systolic BP Percentile --      Boys Diastolic BP Percentile --      Pulse Rate 09/07/23 1041 73     Resp 09/07/23 1041 18     Temp 09/07/23 1041 98 F (36.7 C)     Temp Source 09/07/23 1041 Oral     SpO2 09/07/23 1041 93 %     Weight --      Height --      Head Circumference --      Peak Flow --      Pain Score 09/07/23 1042 0     Pain Loc --      Pain Education --      Exclude from Growth Chart --    No data found.  Updated Vital Signs BP 132/84 (BP Location: Right Arm)   Pulse 73   Temp 98 F (36.7 C) (Oral)   Resp 18   SpO2 93%   Visual Acuity Right Eye Distance:   Left Eye Distance:   Bilateral Distance:    Right Eye Near:   Left Eye Near:    Bilateral Near:     Physical Exam Vitals and nursing note reviewed.  Constitutional:      General: He is not in acute distress.    Appearance: Normal appearance.  HENT:     Head: Normocephalic.     Nose: Nose normal.     Mouth/Throat:     Mouth: Mucous membranes are moist.     Pharynx: No posterior oropharyngeal erythema.     Comments: Airway is clear and patent, no obstruction present. Eyes:     General: Vision grossly intact.        Left eye: No foreign body, discharge or hordeolum.     Extraocular Movements: Extraocular movements intact.     Left eye: Normal extraocular motion and no nystagmus.     Conjunctiva/sclera: Conjunctivae normal.     Left eye: Left conjunctiva is not injected. No chemosis, exudate or hemorrhage.    Pupils: Pupils are equal, round, and reactive to light.     Comments: Swelling noted to left upper and lower eyelids.  Swelling extending down to left cheek. Area is erythematous.  Cardiovascular:     Rate and Rhythm: Normal rate and regular rhythm.     Pulses: Normal pulses.     Heart sounds: Normal heart sounds.  Pulmonary:     Effort: Pulmonary effort is normal. No respiratory distress.     Breath sounds: Normal breath sounds. No stridor. No wheezing, rhonchi or rales.  Abdominal:     General: Bowel sounds are normal.     Palpations: Abdomen is soft.     Tenderness: There is no abdominal tenderness.  Musculoskeletal:     Cervical back: Normal range of motion.  Skin:    General: Skin is warm and dry.  Neurological:     General: No focal deficit present.     Mental Status: He is alert and oriented to person, place, and time.  Psychiatric:        Mood and Affect: Mood normal.        Behavior: Behavior normal.      UC Treatments / Results  Labs (all labs ordered are listed, but only abnormal results are displayed) Labs Reviewed - No data to display  EKG   Radiology No results found.  Procedures Procedures (including critical care time)  Medications Ordered in UC Medications - No data to display  Initial Impression / Assessment and Plan / UC Course  I have reviewed the triage vital signs and the nursing notes.  Pertinent labs & imaging results that were available during my care of the patient were reviewed by me and considered in my medical decision making (see chart for details).  Will treat patient for localized allergic reaction with Decadron  10 mg IM in the clinic.  Will start prednisone  20 mg for the next 5 days.  Supportive care recommendations were provided and discussed with the patient to include continuing over-the-counter antihistamines, cool compresses to the affected area, and to monitor for worsening symptoms.  Patient was given strict ER follow-up precautions.  Patient was in agreement with this plan of care and verbalizes understanding.  All questions were answered.   Patient stable for discharge.   Final Clinical Impressions(s) / UC Diagnoses   Final diagnoses:  None   Discharge Instructions   None    ED Prescriptions   None    PDMP not reviewed this encounter.   Gilmer Etta PARAS, NP 09/07/23 1102    Leath-Warren, Etta PARAS, NP 09/07/23 1103

## 2023-09-12 ENCOUNTER — Telehealth: Payer: Self-pay

## 2023-09-12 NOTE — Telephone Encounter (Unsigned)
 Copied from CRM [id number]. Topic: Clinical - Medication Question >> Sep 12, 2023 12:55 PM Lavanda D wrote: Reason for CRM: Patient is requesting a prescription for an epipen , he has been stung 2x by bee's in the last couple of months and has had allergic reactions and was told by the Dr at urgent care that if he gets stung in the future it could be life threatening for him. He said there has been a lot of bees around. He just wants to be sure he has something on hand in case of emergency if a sting causes life threatening symptoms.  [facility] - [place] ([place]),  - [address] >> Sep 12, 2023  3:34 PM Rosina BIRCH wrote: Patient called stating he is going to pass on the epi pen because he stated urgent care said it was not necessary for him to have an epi pen

## 2023-09-12 NOTE — Telephone Encounter (Signed)
 Called patient and discussed with him he has an appt on Monday at 11:00 he is going to call UC and check if they will send Epipen  and if they will not send this then he will keep appt for Monday.

## 2023-09-12 NOTE — Telephone Encounter (Signed)
 Pt called stated that he wanted to know if we could prescribe him a Epi-pen since he was seen here on July 6th for a bee sting.  After speaking  with the provider that was on duty today I explained to the patient that  after reviewing his chart she felt he did not need an Epi-pen since his reaction was localized to where the bite was.  I advised patient that he should keep his appointment on Monday with his primary so he could discuss the situation further with  him. Pt verbalized understanding but was still upset that we did not call in the prescription for him.

## 2023-09-12 NOTE — Telephone Encounter (Signed)
 On review of urgent care note, agreed that this was a localized reaction, and should not need EpiPen .

## 2023-09-12 NOTE — Telephone Encounter (Signed)
 FYI about patient. Patient had to go to Urgent Care for Bee sting, patient asked if he should have an EpiPen  and I made an appointment to discuss this on Monday. Patient chose to call UC he went to and ask if they will write for the EpiPen  and they declined see note on 09/12/2023 from Nicholaus Ahumada RN who responded to this request from patient, she states he did not need an Epi-pen since his reaction was localized to where the bite was patient called back to our office and asked to cancel appt for Monday stating he is going to pass on EpiPen  for now.

## 2023-09-12 NOTE — Telephone Encounter (Signed)
 Copied from CRM [id number]. Topic: Clinical - Medication Question >> Sep 12, 2023 12:55 PM Lavanda D wrote: Reason for CRM: Patient is requesting a prescription for an epipen , he has been stung 2x by bee's in the last couple of months and has had allergic reactions and was told by the Dr at urgent care that if he gets stung in the future it could be life threatening for him. He said there has been a lot of bees around. He just wants to be sure he has something on hand in case of emergency if a sting causes life threatening symptoms.  [facility] - [place] ([place]), [place] - [address]

## 2023-09-15 ENCOUNTER — Ambulatory Visit: Admitting: Family Medicine

## 2023-09-25 ENCOUNTER — Ambulatory Visit: Payer: Self-pay | Admitting: Gastroenterology

## 2023-09-25 ENCOUNTER — Ambulatory Visit: Admitting: Gastroenterology

## 2023-09-25 ENCOUNTER — Encounter: Payer: Self-pay | Admitting: Gastroenterology

## 2023-09-25 ENCOUNTER — Other Ambulatory Visit (INDEPENDENT_AMBULATORY_CARE_PROVIDER_SITE_OTHER)

## 2023-09-25 VITALS — BP 122/70 | HR 97 | Ht 71.0 in | Wt 216.0 lb

## 2023-09-25 DIAGNOSIS — R194 Change in bowel habit: Secondary | ICD-10-CM

## 2023-09-25 DIAGNOSIS — K5900 Constipation, unspecified: Secondary | ICD-10-CM

## 2023-09-25 LAB — TSH: TSH: 1.26 u[IU]/mL (ref 0.35–5.50)

## 2023-09-25 MED ORDER — NA SULFATE-K SULFATE-MG SULF 17.5-3.13-1.6 GM/177ML PO SOLN: 1.0000 | Freq: Once | ORAL | 0 refills | Status: AC

## 2023-09-25 NOTE — Progress Notes (Signed)
 09/25/2023 Shawn Ball [id number] 09-03-1960   HISTORY OF PRESENT ILLNESS: This is a 63 year old male is a patient of Dr. Clayburn.  Last colonoscopy was April 2019 at which time he only had diverticulosis.  He is here today for recent change in bowel habits with some constipation.  Tells me that he was feeling just fine, moving his bowels regularly.  Then in May he developed a left lower quadrant abdominal pain.  He thought he had diverticulitis.  CT scan the abdomen and pelvis showed sigmoid diverticulosis without diverticulitis and a moderate stool burden.  The ED told him to use MiraLAX.  He tried that for a week and nothing happened.  Saw his PCP who performed abdominal x-ray which showed a large amount of stool throughout the colon.  Told him to increase his MiraLAX to twice a day.  Still nothing was happening.  Eventually started using milk of magnesia, etc. and things started moving.  Once things started moving he has not had any severe constipation, but he still will skip a few days at a time.  He denies any significant changes in his diet, new medications, etc.  He says maybe he has been a little less active recently because of the hot weather, but felt like he just sitting around all the time.  Sounds like he is taking 5 fiber Gummies twice a day and 5 other type of chewable fiber supplements twice a day as well.  He says that the surgeon who took care of his hemorrhoids previously told him he needed 30 g of fiber so has been taking this for quite some time.   Past Medical History:  Diagnosis Date   Allergy    Arthritis    Chronic bronchitis    Chronic pain    per pt/ right shoulder pain, left leg and ankle   CKD (chronic kidney disease) stage 3, GFR 30-59 ml/min (HCC)    Costochondral chest pain    due to torn tendons.   CTS (carpal tunnel syndrome)    right hand   Diabetes mellitus without complication (HCC)    prediabetes/ no meds   Diverticulitis    GERD  (gastroesophageal reflux disease)    History of anal fissures    Hyperlipemia    Hypertension    Rectal bleeding    occasional   Seasonal allergies    Past Surgical History:  Procedure Laterality Date   ANKLE FRACTURE SURGERY  2005 / 2007   left ankle and  left leg/ have screws in leg and ankle   CARPAL TUNNEL RELEASE Left 07/29/2019   Procedure: CARPAL TUNNEL RELEASE;  Surgeon: Murrell Kuba, MD;  Location: [facility];  Service: Orthopedics;  Laterality: Left;  FOREARM BLOCK   EXAMINATION UNDER ANESTHESIA N/A 12/15/2012   Procedure: EXAM UNDER ANESTHESIA;  Surgeon: Camellia CHRISTELLA Blush, MD;  Location: [facility];  Service: General;  Laterality: N/A;   FRACTURE SURGERY  2005 and 2007   lt ankle x2   HEMORRHOIDECTOMY WITH HEMORRHOID BANDING N/A 12/15/2012   Procedure: EXCISIONAL HEMORRHOIDECTOMY WITH HEMORRHOID BANDING;  Surgeon: Camellia CHRISTELLA Blush, MD;  Location: [facility];  Service: General;  Laterality: N/A;   rotator cuff surgery      x 2/ right shoulder   TRIGGER FINGER RELEASE  07/23/2011   Procedure: Right hand- RELEASE TRIGGER FINGER/A-1 PULLEY;  Surgeon: Lamar LULLA Leonor Mickey., MD;  Location: [facility];  Service: Orthopedics;  Laterality:  Right;   WRIST FRACTURE SURGERY  1984   lt with bone graft   WRIST SURGERY     left wrist    reports that he has never smoked. He has quit using smokeless tobacco.  His smokeless tobacco use included chew. He reports that he does not drink alcohol and does not use drugs. family history includes Cancer in his father; Diabetes in his brother and mother; Heart disease in his mother; Hyperlipidemia in his mother; Macular degeneration in his mother and sister. Allergies  Allergen Reactions   Amlodipine Other (See Comments)    Per pt - damage to kidney's   Codeine Nausea And Vomiting   Penicillins Nausea And Vomiting    Patient states he has taken Amoxicillin  without issue recently   Sulfa  Antibiotics Nausea Only    nauseated      Outpatient Encounter Medications as of 09/25/2023  Medication Sig   ASPIRIN 81 PO Take 1 tablet by mouth daily.   Bioflavonoid Products (GRAPE SEED EXTRACT PO) Take by mouth.   Biotin 5000 MCG CAPS 2 times a week   carvedilol  (COREG ) 6.25 MG tablet Take 1 tablet (6.25 mg total) by mouth 2 (two) times daily with a meal.   cetirizine  (ZYRTEC ) 10 MG tablet Take 1 tablet (10 mg total) by mouth daily.   Cinnamon 500 MG TABS Take by mouth.   Collagen Hydrolysate, Bovine, POWD by Does not apply route. Takes Hospital doctor for arthritis   FIBER COMPLETE PO Take by mouth. [employer] fiber gummies   fluticasone  (FLONASE ) 50 MCG/ACT nasal spray Place 2 sprays into both nostrils daily.   Garlic Oil 1000 MG CAPS    Ginger 500 MG CAPS Take by mouth.   Glucosamine-Chondroitin-MSM 500-200-150 MG TABS Take by mouth.   Grapefruit Oil OIL by Does not apply route.   L-Lysine 1000 MG TABS Take by mouth.   Lancets (FREESTYLE) lancets Use as instructed   linagliptin  (TRADJENTA ) 5 MG TABS tablet Take 1 tablet (5 mg total) by mouth daily.   losartan  (COZAAR ) 100 MG tablet Take 1 tablet (100 mg total) by mouth daily.   MAGNESIUM OXIDE PO Take 375 mg by mouth daily.   mometasone -formoterol (DULERA) 100-5 MCG/ACT AERO Inhale 2 puffs into the lungs 2 (two) times daily.   Multiple Vitamins-Minerals (VISION FORMULA EYE HEALTH PO)    Na Sulfate-K Sulfate-Mg Sulfate concentrate (SUPREP) 17.5-3.13-1.6 GM/177ML SOLN Take 1 kit (354 mLs total) by mouth once for 1 dose.   omega-3 acid ethyl esters (LOVAZA ) 1 g capsule Take 2 capsules (2 g total) by mouth 2 (two) times daily.   omeprazole  (PRILOSEC) 20 MG capsule Take 1 capsule (20 mg total) by mouth 2 (two) times daily before a meal.   [employer] VERIO test strip USE 1 STRIP TO CHECK GLUCOSE IN THE MORNING AND AT BEDTIME   pravastatin  (PRAVACHOL ) 20 MG tablet Take 1 tablet (20 mg total) by mouth daily.   No facility-administered  encounter medications on file as of 09/25/2023.    REVIEW OF SYSTEMS  : All other systems reviewed and negative except where noted in the History of Present Illness.   PHYSICAL EXAM: BP 122/70   Pulse 97   Ht 5' 11 (1.803 m)   Wt 216 lb (98 kg)   BMI 30.13 kg/m  General: Well developed white male in no acute distress Head: Normocephalic and atraumatic Eyes:  Sclerae anicteric, conjunctiva pink. Ears: Normal auditory acuity Lungs: Clear throughout to auscultation; no W/R/R. Heart: Regular rate  and rhythm; no M/R/G. Abdomen: Soft, non-distended.  BS present.  Non-tender. Rectal:  Will be done at the time of colonoscopy. Musculoskeletal: Symmetrical with no gross deformities  Skin: No lesions on visible extremities Extremities: No edema  Neurological: Alert oriented x 4, grossly non-focal Psychological:  Alert and cooperative. Normal mood and affect  ASSESSMENT AND PLAN: *Change in bowel habits with constipation: Recent change in bowel habits with constipation.  Had CT scan with moderate stool and then x-ray with large volume stool.  No bowel purge was given, he was told to use MiraLAX regularly.  Never had issues with constipation in the past.  No significant change in diet, medications, etc.  Maybe less active the last few weeks.  Last colonoscopy was April 2019.  Will plan for colonoscopy with Dr. Legrand with a 2 day bowel prep.  Advised that most the time constipation is motility issue, but will rule out any underlying structural issues as well.  Will check a TSH.  Should drink water, eat fruits and vegetables, use MiraLAX if still needed.  The risks, benefits, and alternatives to colonoscopy were discussed with the patient and he consents to proceed.   CC:  Levora Reyes SAUNDERS, MD

## 2023-09-25 NOTE — Patient Instructions (Signed)
 Your provider has requested that you go to the basement level for lab work before leaving today. Press "B" on the elevator. The lab is located at the first door on the left as you exit the elevator.  You have been scheduled for a colonoscopy. Please follow written instructions given to you at your visit today.   If you use inhalers (even only as needed), please bring them with you on the day of your procedure.  DO NOT TAKE 7 DAYS PRIOR TO TEST- Trulicity (dulaglutide) Ozempic, Wegovy (semaglutide) Mounjaro  (tirzepatide ) Bydureon Bcise (exanatide extended release)  DO NOT TAKE 1 DAY PRIOR TO YOUR TEST Rybelsus (semaglutide) Adlyxin (lixisenatide) Victoza (liraglutide) Byetta (exanatide) ___________________________________________________________________________

## 2023-09-29 NOTE — Progress Notes (Signed)
 ____________________________________________________________  Attending physician addendum:  Thank you for sending this case to me. I have reviewed the entire note and agree with the plan.   Amada Jupiter, MD  ____________________________________________________________

## 2023-10-13 ENCOUNTER — Ambulatory Visit: Admitting: Family Medicine

## 2023-10-13 VITALS — BP 124/82 | HR 72 | Temp 98.2°F | Ht 71.0 in | Wt 212.6 lb

## 2023-10-13 DIAGNOSIS — R079 Chest pain, unspecified: Secondary | ICD-10-CM

## 2023-10-13 DIAGNOSIS — N182 Chronic kidney disease, stage 2 (mild): Secondary | ICD-10-CM

## 2023-10-13 DIAGNOSIS — R1030 Lower abdominal pain, unspecified: Secondary | ICD-10-CM | POA: Diagnosis not present

## 2023-10-13 DIAGNOSIS — E1122 Type 2 diabetes mellitus with diabetic chronic kidney disease: Secondary | ICD-10-CM | POA: Diagnosis not present

## 2023-10-13 DIAGNOSIS — K5900 Constipation, unspecified: Secondary | ICD-10-CM | POA: Diagnosis not present

## 2023-10-13 DIAGNOSIS — E782 Mixed hyperlipidemia: Secondary | ICD-10-CM | POA: Diagnosis not present

## 2023-10-13 DIAGNOSIS — I10 Essential (primary) hypertension: Secondary | ICD-10-CM

## 2023-10-13 LAB — COMPREHENSIVE METABOLIC PANEL WITH GFR
ALT: 25 U/L (ref 0–53)
AST: 28 U/L (ref 0–37)
Albumin: 4.1 g/dL (ref 3.5–5.2)
Alkaline Phosphatase: 55 U/L (ref 39–117)
BUN: 20 mg/dL (ref 6–23)
CO2: 29 meq/L (ref 19–32)
Calcium: 9 mg/dL (ref 8.4–10.5)
Chloride: 102 meq/L (ref 96–112)
Creatinine, Ser: 1 mg/dL (ref 0.40–1.50)
GFR: 81.11 mL/min (ref >60.00)
Glucose, Bld: 112 mg/dL — ABNORMAL HIGH (ref 70–99)
Potassium: 3.8 meq/L (ref 3.5–5.1)
Sodium: 139 meq/L (ref 135–145)
Total Bilirubin: 0.6 mg/dL (ref 0.2–1.2)
Total Protein: 6.5 g/dL (ref 6.0–8.3)

## 2023-10-13 LAB — LIPID PANEL
Cholesterol: 152 mg/dL (ref 0–200)
HDL: 29.6 mg/dL — ABNORMAL LOW (ref >39.00)
LDL Cholesterol: 101 mg/dL — ABNORMAL HIGH (ref 0–99)
NonHDL: 122.84
Total CHOL/HDL Ratio: 5
Triglycerides: 110 mg/dL (ref 0.0–149.0)
VLDL: 22 mg/dL (ref 0.0–40.0)

## 2023-10-13 LAB — HEMOGLOBIN A1C: Hgb A1c MFr Bld: 6.9 % — ABNORMAL HIGH (ref 4.6–6.5)

## 2023-10-13 NOTE — Patient Instructions (Addendum)
 Depending on labs today, can discuss options for improved blood sugar if needed.  Depending on cholesterol labs, we can discuss dose changes on cholesterol pill.  Keep follow up for colonoscopy and ongoing care with gastroenterology for your stomach issues.  I am sorry to hear about the passing of your mother.  Please let me know if we can help during this difficult time.  Although you have only had a few fleeting episodes of chest pain, and appear to be more gas related, with your health history I think it would be reasonable to meet with cardiology again.  I have placed that referral.  If any new or worsening symptoms be seen.  Hang in there.   Nonspecific Chest Pain, Adult Chest pain is an uncomfortable, tight, or painful feeling in the chest. The pain can feel like a crushing, aching, or squeezing pressure. A person can feel a burning or tingling sensation. Chest pain can also be felt in your back, neck, jaw, shoulder, or arm. This pain can be worse when you move, sneeze, or take a deep breath. Chest pain can be caused by a condition that is life-threatening. This must be treated right away. It can also be caused by something that is not life-threatening. If you have chest pain, it can be hard to know the difference, so it is important to get help right away to make sure that you do not have a serious condition. Some life-threatening causes of chest pain include: Heart attack. A tear in the body's main blood vessel (aortic dissection). Inflammation around your heart (pericarditis). A problem in the lungs, such as a blood clot (pulmonary embolism) or a collapsed lung (pneumothorax). Some non life-threatening causes of chest pain include: Heartburn. Anxiety or stress. Damage to the bones, muscles, and cartilage that make up your chest wall. Pneumonia or bronchitis. Shingles infection (varicella-zoster virus). Your chest pain may come and go. It may also be constant. Your health care provider  will do tests and other studies to find the cause of your pain. Treatment will depend on the cause of your chest pain. Follow these instructions at home: Medicines Take over-the-counter and prescription medicines only as told by your health care provider. If you were prescribed an antibiotic medicine, take it as told by your health care provider. Do not stop taking the antibiotic even if you start to feel better. Activity Avoid any activities that cause chest pain. Do not lift anything that is heavier than 10 lb (4.5 kg), or the limit that you are told, until your health care provider says that it is safe. Rest as directed by your health care provider. Return to your normal activities only as told by your health care provider. Ask your health care provider what activities are safe for you. Lifestyle     Do not use any products that contain nicotine or tobacco, such as cigarettes, e-cigarettes, and chewing tobacco. If you need help quitting, ask your health care provider. Do not drink alcohol. Make healthy lifestyle changes as recommended. These may include: Getting regular exercise. Ask your health care provider to suggest some exercises that are safe for you. Eating a heart-healthy diet. This includes plenty of fresh fruits and vegetables, whole grains, low-fat (lean) protein, and low-fat dairy products. A dietitian can help you find healthy eating options. Maintaining a healthy weight. Managing any other health conditions you may have, such as high blood pressure (hypertension) or diabetes. Reducing stress, such as with yoga or relaxation techniques. General instructions  Pay attention to any changes in your symptoms. It is up to you to get the results of any tests that were done. Ask your health care provider, or the department that is doing the tests, when your results will be ready. Keep all follow-up visits as told by your health care provider. This is important. You may be asked to go  for further testing if your chest pain does not go away. Contact a health care provider if: Your chest pain does not go away. You feel depressed. You have a fever. You notice changes in your symptoms or develop new symptoms. Get help right away if: Your chest pain gets worse. You have a cough that gets worse, or you cough up blood. You have severe pain in your abdomen. You faint. You have sudden, unexplained chest discomfort. You have sudden, unexplained discomfort in your arms, back, neck, or jaw. You have shortness of breath at any time. You suddenly start to sweat, or your skin gets clammy. You feel nausea or you vomit. You suddenly feel lightheaded or dizzy. You have severe weakness, or unexplained weakness or fatigue. Your heart begins to beat quickly, or it feels like it is skipping beats. These symptoms may represent a serious problem that is an emergency. Do not wait to see if the symptoms will go away. Get medical help right away. Call your local emergency services (911 in the [place] Do not drive yourself to the hospital. Summary Chest pain can be caused by a condition that is serious and requires urgent treatment. It may also be caused by something that is not life-threatening. Your health care provider may do lab tests and other studies to find the cause of your pain. Follow your health care provider's instructions on taking medicines, making lifestyle changes, and getting emergency treatment if symptoms become worse. Keep all follow-up visits as told by your health care provider. This includes visits for any further testing if your chest pain does not go away. This information is not intended to replace advice given to you by your health care provider. Make sure you discuss any questions you have with your health care provider. Document Revised: 01/03/2022 Document Reviewed: 01/03/2022 Elsevier Patient Education  2024 [employer].

## 2023-10-13 NOTE — Progress Notes (Signed)
 Subjective:  Patient ID: Shawn JONELLE Theo Mickey., male    DOB: [birth year]  Age: 63 y.o. MRN: [id number]  CC:  Chief Complaint  Patient presents with   GI Problem    States blood sugar has been high for last week. Has had stomach issues for about 3 months. States miralax isnt helping. Last couple days he is taking milk and magnesium now has diarrhea.     HPI Shawn Ball. presents for   Abdominal pain, change in bowel habits Discussed in May, had been seen in the ER with his concern for possible diverticulitis, but CT was reassuring with no evidence of diverticulitis or complication.  Increase stool burden was noted and constipation thought to be possibly contributing to his discomfort.  Over-the-counter MiraLAX and Colace have been recommended in addition to fiber supplement he had already been using and CBC and lipase were normal at that time.  Bowel movements were improving at his May 22 visit but not yet back to normal.  Stool regimen was discussed, recommended try MiraLAX again if needed, 1 view abdominal x-ray obtained with ER/RTC precautions given. Abdominal x-ray did indicate large amount of stool throughout the entire colon on May 22.  He is now followed by gastroenterology, note reviewed from July 24.  Minimal change in treatment with MiraLAX, added milk of magnesia with some improved movements.  With these bowel changes, plan for colonoscopy with Dr. Legrand.  Thought to have a motility issue as cause of constipation but plan to rule out structural issue.  TSH normal.  Some diarrhea past few days - milk of magnesia stopped today, prior daily use.  Colonoscopy 8/20.  Hypertension: Losartan  100 mg daily, carvedilol  6.25 mg twice daily. No new med side effects.  Home readings: 120-130/80 range.  BP Readings from Last 3 Encounters:  10/13/23 124/82  09/25/23 122/70  09/07/23 132/84   Lab Results  Component Value Date   CREATININE 1.14 07/24/2023   CREATININE 1.14  07/24/2023   Hyperlipidemia: He is on multiple over-the-counter supplements, but also takes pravastatin  20 mg daily, Lovaza  2 g twice daily. Sore in mouth with simavastatin. Only one sore with pravastatin  that has resolved.  No new side effects.  Off cinnamon, grapefruit seed.  Lab Results  Component Value Date   CHOL 176 05/22/2023   HDL 30.00 (L) 05/22/2023   LDLCALC 116 (H) 05/22/2023   LDLDIRECT 75.0 06/25/2021   TRIG 149.0 05/22/2023   CHOLHDL 6 05/22/2023   Lab Results  Component Value Date   ALT 18 07/24/2023   AST 17 07/24/2023   ALKPHOS 67 07/24/2023   BILITOT 0.5 07/24/2023   Diabetes: With history of hyperglycemia, complicated by microalbuminuria, CKD as well.  See prior notes.  Concern for higher blood sugars back in May, but glucose 111 on CMP in the ER  Treated with Tradjenta  5 mg daily.  Still on statin, ARB. Still some cinnamon, but less effective for blood sugar. Off grapefruit seed.  Home readings fasting: 180-190 range, some 200's.  Postprandial:none.  No symptomatic lows. Lowest 170-180.   Microalbumin: Ratio of 63 on 05/22/2023, on ARB as above. Optho, foot exam, pneumovax: Up-to-date  Lab Results  Component Value Date   HGBA1C 6.6 (H) 05/22/2023   HGBA1C 6.4 11/20/2022   HGBA1C 6.2 08/15/2022   Lab Results  Component Value Date   MICROALBUR 9.5 (H) 05/22/2023   LDLCALC 116 (H) 05/22/2023   CREATININE 1.14 07/24/2023   CREATININE 1.14 07/24/2023   Chest  pain: Noted in morning time a few times.  Center of chest with waking - felt like gas. No arm radiation with chest pain, but some arthritis and pain in left arm separately at times.  Occasional belching, not always. No CP or DOE with activity. No associated diaphroresis. He feels like gas/bloating is the cause. Takes gas pills nightly.  Stress test in 2021. Low risk study. Small reversible defect in basal inferoseptal and mid inferoseptal location. Reassuring echo. - Dr. Barbaraann.  No recent  cardiology visit.   Mom passed away in 08-23-23. Doing ok. Declines any ned for other resources at this time.   History Patient Active Problem List   Diagnosis Date Noted   Constipation 09/25/2023   Acute viral conjunctivitis of right eye 08/28/2020   Routine general medical examination at a health care facility 05/01/2020   Hyperlipidemia LDL goal <70 03/15/2020   Hypertriglyceridemia 03/15/2020   Diabetes mellitus without complication (HCC)    Benign essential HTN 08/25/2019   Past Medical History:  Diagnosis Date   Allergy    Arthritis    Chronic bronchitis    Chronic pain    per pt/ right shoulder pain, left leg and ankle   CKD (chronic kidney disease) stage 3, GFR 30-59 ml/min (HCC)    Costochondral chest pain    due to torn tendons.   CTS (carpal tunnel syndrome)    right hand   Diabetes mellitus without complication (HCC)    prediabetes/ no meds   Diverticulitis    GERD (gastroesophageal reflux disease)    History of anal fissures    Hyperlipemia    Hypertension    Rectal bleeding    occasional   Seasonal allergies    Past Surgical History:  Procedure Laterality Date   ANKLE FRACTURE SURGERY  2005 / 2007   left ankle and  left leg/ have screws in leg and ankle   CARPAL TUNNEL RELEASE Left 07/29/2019   Procedure: CARPAL TUNNEL RELEASE;  Surgeon: Murrell Kuba, MD;  Location: [facility];  Service: Orthopedics;  Laterality: Left;  FOREARM BLOCK   EXAMINATION UNDER ANESTHESIA N/A 12/15/2012   Procedure: EXAM UNDER ANESTHESIA;  Surgeon: Camellia CHRISTELLA Blush, MD;  Location: [facility];  Service: General;  Laterality: N/A;   FRACTURE SURGERY  2005 and 2007   lt ankle x2   HEMORRHOIDECTOMY WITH HEMORRHOID BANDING N/A 12/15/2012   Procedure: EXCISIONAL HEMORRHOIDECTOMY WITH HEMORRHOID BANDING;  Surgeon: Camellia CHRISTELLA Blush, MD;  Location: [facility];  Service: General;  Laterality: N/A;   rotator cuff surgery      x 2/ right shoulder    TRIGGER FINGER RELEASE  07/23/2011   Procedure: Right hand- RELEASE TRIGGER FINGER/A-1 PULLEY;  Surgeon: Lamar LULLA Leonor Mickey., MD;  Location:  SURGERY CENTER;  Service: Orthopedics;  Laterality: Right;   WRIST FRACTURE SURGERY  1984   lt with bone graft   WRIST SURGERY     left wrist   Allergies  Allergen Reactions   Amlodipine Other (See Comments)    Per pt - damage to kidney's   Codeine Nausea And Vomiting   Penicillins Nausea And Vomiting    Patient states he has taken Amoxicillin  without issue recently   Sulfa Antibiotics Nausea Only    nauseated   Prior to Admission medications   Medication Sig Start Date End Date Taking? Authorizing Provider  ASPIRIN 81 PO Take 1 tablet by mouth daily.   Yes [provider]  Biotin  5000 MCG CAPS 2 times a week 12/03/14  Yes [provider]  carvedilol  (COREG ) 6.25 MG tablet Take 1 tablet (6.25 mg total) by mouth 2 (two) times daily with a meal. 05/22/23  Yes Levora Shawn SAUNDERS, MD  cetirizine  (ZYRTEC ) 10 MG tablet Take 1 tablet (10 mg total) by mouth daily. 10/28/19  Yes Duanne Butler DASEN, MD  Collagen Hydrolysate, Bovine, POWD by Does not apply route. Takes Hospital doctor for arthritis   Yes [provider]  FIBER COMPLETE PO Take by mouth. Vita Fusion fiber gummies   Yes [provider]  fluticasone  (FLONASE ) 50 MCG/ACT nasal spray Place 2 sprays into both nostrils daily. 04/27/18  Yes Duanne Butler DASEN, MD  Garlic Oil 1000 MG CAPS  10/02/20  Yes [provider]  Ginger 500 MG CAPS Take by mouth.   Yes [provider]  Glucosamine-Chondroitin-MSM 500-200-150 MG TABS Take by mouth.   Yes [provider]  L-Lysine 1000 MG TABS Take by mouth.   Yes [provider]  Lancets (FREESTYLE) lancets Use as instructed 10/28/19  Yes Duanne Butler DASEN, MD  linagliptin  (TRADJENTA ) 5 MG TABS tablet Take 1 tablet (5 mg total) by mouth daily. 05/22/23  Yes Levora Shawn SAUNDERS, MD  losartan  (COZAAR )  100 MG tablet Take 1 tablet (100 mg total) by mouth daily. 05/22/23  Yes Levora Shawn SAUNDERS, MD  MAGNESIUM OXIDE PO Take 375 mg by mouth daily.   Yes [provider]  mometasone -formoterol (DULERA) 100-5 MCG/ACT AERO Inhale 2 puffs into the lungs 2 (two) times daily.   Yes [provider]  Multiple Vitamins-Minerals (VISION FORMULA EYE HEALTH PO)  06/02/17  Yes [provider]  omega-3 acid ethyl esters (LOVAZA ) 1 g capsule Take 2 capsules (2 g total) by mouth 2 (two) times daily. 05/22/23  Yes Levora Shawn SAUNDERS, MD  omeprazole  (PRILOSEC) 20 MG capsule Take 1 capsule (20 mg total) by mouth 2 (two) times daily before a meal. 05/22/23  Yes Levora Shawn SAUNDERS, MD  [facility] VERIO test strip USE 1 STRIP TO CHECK GLUCOSE IN THE MORNING AND AT BEDTIME 09/04/23  Yes Levora Shawn SAUNDERS, MD  pravastatin  (PRAVACHOL ) 20 MG tablet Take 1 tablet (20 mg total) by mouth daily. 05/27/23  Yes Levora Shawn SAUNDERS, MD  Bioflavonoid Products (GRAPE SEED EXTRACT PO) Take by mouth.    [provider]  Cinnamon 500 MG TABS Take by mouth.    [provider]  Grapefruit Oil OIL by Does not apply route. Patient not taking: Reported on 10/13/2023    [provider]   Social History   Socioeconomic History   Marital status: Divorced    Spouse name: Not on file   Number of children: 1   Years of education: Not on file   Highest education level: Some college, no degree  Occupational History   Occupation: disabled  Tobacco Use   Smoking status: Never   Smokeless tobacco: Former    Types: Chew   Tobacco comments:    Quit age 60 y.o  Vaping Use   Vaping status: Never Used  Substance and Sexual Activity   Alcohol use: No   Drug use: No   Sexual activity: Not Currently  Other Topics Concern   Not on file  Social History Narrative   Not on file   Social Drivers of Health   Financial Resource Strain: Low Risk  (10/12/2023)   Overall Financial Resource Strain (CARDIA)     Difficulty of Paying Living  Expenses: Not very hard  Food Insecurity: No Food Insecurity (10/12/2023)   Hunger Vital Sign    Worried About Running Out of Food in the Last Year: Never true    Ran Out of Food in the Last Year: Never true  Transportation Needs: No Transportation Needs (10/12/2023)   PRAPARE - Administrator, Civil Service (Medical): No    Lack of Transportation (Non-Medical): No  Physical Activity: Inactive (10/12/2023)   Exercise Vital Sign    Days of Exercise per Week: 0 days    Minutes of Exercise per Session: Not on file  Stress: No Stress Concern Present (10/12/2023)   [employer] of Occupational Health - Occupational Stress Questionnaire    Feeling of Stress: Not at all  Social Connections: Moderately Integrated (10/12/2023)   Social Connection and Isolation Panel    Frequency of Communication with Friends and Family: More than three times a week    Frequency of Social Gatherings with Friends and Family: More than three times a week    Attends Religious Services: More than 4 times per year    Active Member of [employer] or Organizations: Yes    Attends Engineer, structural: More than 4 times per year    Marital Status: Divorced  Catering manager Violence: Not on file    Review of Systems Per HPI.   Objective:   Vitals:   10/13/23 1059  BP: 124/82  Pulse: 72  Temp: 98.2 F (36.8 C)  TempSrc: Oral  SpO2: 95%  Weight: 212 lb 9.6 oz (96.4 kg)  Height: 5' 11 (1.803 m)     Physical Exam Vitals reviewed.  Constitutional:      Appearance: He is well-developed.  HENT:     Head: Normocephalic and atraumatic.  Neck:     Vascular: No carotid bruit or JVD.  Cardiovascular:     Rate and Rhythm: Normal rate and regular rhythm.     Heart sounds: Normal heart sounds. No murmur heard. Pulmonary:     Effort: Pulmonary effort is normal.     Breath sounds: Normal breath sounds. No rales.  Musculoskeletal:     Right lower leg: No edema.      Left lower leg: No edema.  Skin:    General: Skin is warm and dry.  Neurological:     Mental Status: He is alert and oriented to person, place, and time.  Psychiatric:        Mood and Affect: Mood normal.    EKG, sinus rhythm, PR 168, rate 66, QTc 417.Compared to 02/11/2020, no apparent acute changes.  T wave inversion in lead III, but not seen in  II, aVF.   Assessment & Plan:  Shawn Ball. is a 63 y.o. male . Central chest pain - Plan: EKG 12-Lead, Ambulatory referral to Cardiology  Lower abdominal pain  Constipation, unspecified constipation type  Type 2 diabetes mellitus with stage 2 chronic kidney disease, without long-term current use of insulin (HCC) - Plan: Hemoglobin A1c  Mixed hyperlipidemia - Plan: Comprehensive metabolic panel with GFR, Lipid panel  Essential hypertension   No orders of the defined types were placed in this encounter.  Patient Instructions  Depending on labs today, can discuss options for improved blood sugar if needed.  Depending on cholesterol labs, we can discuss dose changes on cholesterol pill.  Keep follow up for colonoscopy and ongoing care with gastroenterology for your stomach issues.  I am sorry to hear about the passing of  use of insulin (HCC) - Plan: Hemoglobin A1c  - Tolerating current regimen, check A1c and adjust meds accordingly.  Mixed hyperlipidemia - Plan: Comprehensive metabolic panel with GFR, Lipid panel  - Tolerating current regimen, continue same, check labs and adjust plan accordingly  Essential hypertension  - Stable with current med regimen, continue same.  No orders of the defined types were placed in this encounter.  Patient Instructions  Depending on labs today, can discuss options for improved blood sugar if needed.  Depending on cholesterol labs, we can discuss dose changes on cholesterol pill.  Keep follow up for colonoscopy and ongoing care with gastroenterology for your stomach  issues.  I am sorry to hear about the passing of your mother.  Please let me know if we can help during this difficult time.  Although you have only had a few fleeting episodes of chest pain, and appear to be more gas related, with your health history I think it would be reasonable to meet with cardiology again.  I have placed that referral.  If any new or worsening symptoms be seen.  Hang in there.   Nonspecific Chest Pain, Adult Chest pain is an uncomfortable, tight, or painful feeling in the chest. The pain can feel like a crushing, aching, or squeezing pressure. A person can feel a burning or tingling sensation. Chest pain can also be felt in your back, neck, jaw, shoulder, or arm. This pain can be worse when you move, sneeze, or take a deep breath. Chest pain can be caused by a condition that is life-threatening. This must be treated right away. It can also be caused by something that is not life-threatening. If you have chest pain, it can be hard to know the difference, so it is important to get help right away to make sure that you do not have a serious condition. Some life-threatening causes of chest pain include: Heart attack. A tear in the body's main blood vessel (aortic dissection). Inflammation around your heart (pericarditis). A problem in the lungs, such as a blood clot (pulmonary embolism) or a collapsed lung (pneumothorax). Some non life-threatening causes of chest pain include: Heartburn. Anxiety or stress. Damage to the bones, muscles, and cartilage that make up your chest wall. Pneumonia or bronchitis. Shingles infection (varicella-zoster virus). Your chest pain may come and go. It may also be constant. Your health care provider will do tests and other studies to find the cause of your pain. Treatment will depend on the cause of your chest pain. Follow these instructions at home: Medicines Take over-the-counter and prescription medicines only as told by your health care  provider. If you were prescribed an antibiotic medicine, take it as told by your health care provider. Do not stop taking the antibiotic even if you start to feel better. Activity Avoid any activities that cause chest pain. Do not lift anything that is heavier than 10 lb (4.5 kg), or the limit that you are told, until your health care provider says that it is safe. Rest as directed by your health care provider. Return to your normal activities only as told by your health care provider. Ask your health care provider what activities are safe for you. Lifestyle     Do not use any products that contain nicotine or tobacco, such as cigarettes, e-cigarettes, and chewing tobacco. If you need help quitting, ask your health care provider. Do not drink alcohol. Make healthy lifestyle changes as recommended. These may include: Getting regular exercise. Ask your  health care provider. This is important. You may be asked to go for further testing if your chest pain does not go away. Contact a health care provider if: Your chest pain does not go away. You feel depressed. You have a fever. You notice changes in your symptoms or develop new symptoms. Get help right away if: Your chest pain gets worse. You have a cough that gets worse, or you cough up blood. You have severe pain in your abdomen. You faint. You have sudden, unexplained chest discomfort. You have sudden, unexplained discomfort in your arms, back, neck, or jaw. You have shortness of breath at any time. You suddenly start to sweat, or your skin gets clammy. You feel nausea or you vomit. You suddenly feel lightheaded or dizzy. You have severe weakness, or unexplained weakness or fatigue. Your heart begins to beat quickly, or it feels like it is skipping beats. These symptoms may represent a serious problem that is an emergency. Do not wait to see if the symptoms will go away. Get medical help right away. Call your local emergency services (911  in the [place] Do not drive yourself to the hospital. Summary Chest pain can be caused by a condition that is serious and requires urgent treatment. It may also be caused by something that is not life-threatening. Your health care provider may do lab tests and other studies to find the cause of your pain. Follow your health care provider's instructions on taking medicines, making lifestyle changes, and getting emergency treatment if symptoms become worse. Keep all follow-up visits as told by your health care provider. This includes visits for any further testing if your chest pain does not go away. This information is not intended to replace advice given to you by your health care provider. Make sure you discuss any questions you have with your health care provider. Document Revised: 01/03/2022 Document Reviewed: 01/03/2022 Elsevier Patient Education  2024 [employer] Inc[employer]    Signed,   Shawn Pines, MD [facility], [facility] 10/13/23 12:13 PM

## 2023-10-14 ENCOUNTER — Ambulatory Visit: Payer: Self-pay | Admitting: Family Medicine

## 2023-10-14 ENCOUNTER — Encounter: Payer: Self-pay | Admitting: Family Medicine

## 2023-10-21 ENCOUNTER — Telehealth: Payer: Self-pay | Admitting: Gastroenterology

## 2023-10-21 NOTE — Telephone Encounter (Signed)
 Returned the patients phone call discussed clear liquid options that would keep his blood sugar elevated ie.SABRA apple juice, soda, hard candy as well as medications that he should take today and tomorrow. Pt verbalized understanding.

## 2023-10-21 NOTE — Telephone Encounter (Signed)
 Inbound call from patient stating he mixes glucerna powder, water, and peanut butter powder for his diabetes. Patient is requesting to know if this is okay to drink before tomorrow's procedure. If not, patient would like other recommendations. Also requesting to know if he can drink flavored black coffee. Please advise, thank you

## 2023-10-22 ENCOUNTER — Ambulatory Visit: Admitting: Gastroenterology

## 2023-10-22 ENCOUNTER — Encounter: Payer: Self-pay | Admitting: Gastroenterology

## 2023-10-22 VITALS — BP 130/85 | HR 77 | Temp 98.8°F | Resp 22 | Ht 71.0 in | Wt 216.0 lb

## 2023-10-22 DIAGNOSIS — R194 Change in bowel habit: Secondary | ICD-10-CM

## 2023-10-22 DIAGNOSIS — D123 Benign neoplasm of transverse colon: Secondary | ICD-10-CM | POA: Diagnosis present

## 2023-10-22 DIAGNOSIS — K635 Polyp of colon: Secondary | ICD-10-CM | POA: Diagnosis not present

## 2023-10-22 DIAGNOSIS — K5730 Diverticulosis of large intestine without perforation or abscess without bleeding: Secondary | ICD-10-CM

## 2023-10-22 MED ORDER — SODIUM CHLORIDE 0.9 % IV SOLN: 500.0000 mL | INTRAVENOUS | Status: DC

## 2023-10-22 NOTE — Progress Notes (Signed)
 Called to room to assist during endoscopic procedure.  Patient ID and intended procedure confirmed with present staff. Received instructions for my participation in the procedure from the performing physician.

## 2023-10-22 NOTE — Progress Notes (Signed)
 No significant changes to clinical history since GI office visit on 09/25/23.  The patient is appropriate for an endoscopic procedure in the ambulatory setting.  - Victory Brand, MD

## 2023-10-22 NOTE — Op Note (Signed)
  [facility] Patient Name: Shawn Ball Procedure Date: 10/22/2023 3:46 PM MRN: [id number] Endoscopist: Victory L. Legrand , MD, [id number] Age: 63 Referring MD:  Date of Birth: [birth year] Gender: Male Account #: [id number] Procedure:                Colonoscopy Indications:              Change in bowel habits Medicines:                Monitored Anesthesia Care Procedure:                Pre-Anesthesia Assessment:                           - Prior to the procedure, a History and Physical                            was performed, and patient medications and                            allergies were reviewed. The patient's tolerance of                            previous anesthesia was also reviewed. The risks                            and benefits of the procedure and the sedation                            options and risks were discussed with the patient.                            All questions were answered, and informed consent                            was obtained. Prior Anticoagulants: The patient has                            taken no anticoagulant or antiplatelet agents. ASA                            Grade Assessment: II - A patient with mild systemic                            disease. After reviewing the risks and benefits,                            the patient was deemed in satisfactory condition to                            undergo the procedure.                           After obtaining informed consent, the colonoscope  was passed under direct vision. Throughout the                            procedure, the patient's blood pressure, pulse, and                            oxygen saturations were monitored continuously. The                            Olympus Scope [id number] was introduced through the                            anus and advanced to the the cecum, identified by                            appendiceal orifice and  ileocecal valve. The                            colonoscopy was performed without difficulty. The                            patient tolerated the procedure well. The quality                            of the bowel preparation was good. The ileocecal                            valve, appendiceal orifice, and rectum were                            photographed. Scope In: 3:58:58 PM Scope Out: 4:19:01 PM Scope Withdrawal Time: 0 hours 13 minutes 55 seconds  Total Procedure Duration: 0 hours 20 minutes 3 seconds  Findings:                 The perianal and digital rectal examinations were                            normal.                           Repeat examination of right colon under NBI                            performed.                           Many diverticula were found in the left colon.                           A diminutive polyp was found in the transverse                            colon. The polyp was sessile. The polyp was removed  with a cold biopsy forceps. Resection and retrieval                            were complete.                           The exam was otherwise without abnormality on                            direct and retroflexion views. Complications:            No immediate complications. Estimated Blood Loss:     Estimated blood loss was minimal. Impression:               - Diverticulosis in the left colon.                           - One diminutive polyp in the transverse colon,                            removed with a cold biopsy forceps. Resected and                            retrieved.                           - The examination was otherwise normal on direct                            and retroflexion views. Recommendation:           - Patient has a contact number available for                            emergencies. The signs and symptoms of potential                            delayed complications were discussed with  the                            patient. Return to normal activities tomorrow.                            Written discharge instructions were provided to the                            patient.                           - Resume previous diet.                           - Continue present medications.                           - Await pathology results.                           -  Repeat colonoscopy is recommended for                            surveillance. The colonoscopy date will be                            determined after pathology results from today's                            exam become available for review. (and replace any                            existing recall from the 2019 colonoscopy)                           - High fiber diet with fiber supplement if needed                            to meet daily goal of about 30 grams.                           Use miralax powder as needed for episodic                            constipation Kaleo Condrey L. Legrand, MD 10/22/2023 4:25:02 PM This report has been signed electronically.

## 2023-10-22 NOTE — Patient Instructions (Signed)
 Educational handout provided to patient related to Polyps, Diverticulosis, and high fiber diet  High Fiber diet  Continue present medications  Awaiting pathology results   YOU HAD AN ENDOSCOPIC PROCEDURE TODAY AT THE [facility]:   Refer to the procedure report that was given to you for any specific questions about what was found during the examination.  If the procedure report does not answer your questions, please call your gastroenterologist to clarify.  If you requested that your care partner not be given the details of your procedure findings, then the procedure report has been included in a sealed envelope for you to review at your convenience later.  YOU SHOULD EXPECT: Some feelings of bloating in the abdomen. Passage of more gas than usual.  Walking can help get rid of the air that was put into your GI tract during the procedure and reduce the bloating. If you had a lower endoscopy (such as a colonoscopy or flexible sigmoidoscopy) you may notice spotting of blood in your stool or on the toilet paper. If you underwent a bowel prep for your procedure, you may not have a normal bowel movement for a few days.  Please Note:  You might notice some irritation and congestion in your nose or some drainage.  This is from the oxygen used during your procedure.  There is no need for concern and it should clear up in a day or so.  SYMPTOMS TO REPORT IMMEDIATELY:  Following lower endoscopy (colonoscopy or flexible sigmoidoscopy):  Excessive amounts of blood in the stool  Significant tenderness or worsening of abdominal pains  Swelling of the abdomen that is new, acute  Fever of 100F or higher  For urgent or emergent issues, a gastroenterologist can be reached at any hour by calling [phone]. Do not use MyChart messaging for urgent concerns.    DIET:  We do recommend a small meal at first, but then you may proceed to your regular diet.  Drink plenty of fluids but you should  avoid alcoholic beverages for 24 hours.  ACTIVITY:  You should plan to take it easy for the rest of today and you should NOT DRIVE or use heavy machinery until tomorrow (because of the sedation medicines used during the test).    FOLLOW UP: Our staff will call the number listed on your records the next business day following your procedure.  We will call around 7:15- 8:00 am to check on you and address any questions or concerns that you may have regarding the information given to you following your procedure. If we do not reach you, we will leave a message.     If any biopsies were taken you will be contacted by phone or by letter within the next 1-3 weeks.  Please call us  at [phone] if you have not heard about the biopsies in 3 weeks.    SIGNATURES/CONFIDENTIALITY: You and/or your care partner have signed paperwork which will be entered into your electronic medical record.  These signatures attest to the fact that that the information above on your After Visit Summary has been reviewed and is understood.  Full responsibility of the confidentiality of this discharge information lies with you and/or your care-partner.

## 2023-10-22 NOTE — Progress Notes (Signed)
 Sedate, gd SR, tolerated procedure well, VSS, report to RN

## 2023-10-23 ENCOUNTER — Telehealth: Payer: Self-pay

## 2023-10-23 NOTE — Telephone Encounter (Signed)
 Follow up call to pt, lm for pt to call if having any difficulty with normal activities or eating and drinking.  Also to call if any other questions or concerns.

## 2023-10-26 ENCOUNTER — Other Ambulatory Visit: Payer: Self-pay | Admitting: Family Medicine

## 2023-10-26 DIAGNOSIS — E1122 Type 2 diabetes mellitus with diabetic chronic kidney disease: Secondary | ICD-10-CM

## 2023-10-27 ENCOUNTER — Ambulatory Visit: Payer: Self-pay | Admitting: Gastroenterology

## 2023-10-27 LAB — SURGICAL PATHOLOGY

## 2023-11-03 ENCOUNTER — Other Ambulatory Visit: Payer: Self-pay | Admitting: Family Medicine

## 2023-11-03 DIAGNOSIS — E782 Mixed hyperlipidemia: Secondary | ICD-10-CM

## 2023-11-03 DIAGNOSIS — N182 Chronic kidney disease, stage 2 (mild): Secondary | ICD-10-CM

## 2023-11-18 ENCOUNTER — Other Ambulatory Visit: Payer: Self-pay

## 2023-11-18 ENCOUNTER — Ambulatory Visit: Payer: Self-pay

## 2023-11-18 ENCOUNTER — Ambulatory Visit
Admission: EM | Admit: 2023-11-18 | Discharge: 2023-11-18 | Disposition: A | Discharge status: Home / Self Care | Attending: Family Medicine | Admitting: Family Medicine

## 2023-11-18 ENCOUNTER — Ambulatory Visit: Admitting: Family Medicine

## 2023-11-18 ENCOUNTER — Telehealth: Payer: Self-pay | Admitting: Emergency Medicine

## 2023-11-18 ENCOUNTER — Encounter: Payer: Self-pay | Admitting: Emergency Medicine

## 2023-11-18 ENCOUNTER — Telehealth: Payer: Self-pay

## 2023-11-18 DIAGNOSIS — G8929 Other chronic pain: Secondary | ICD-10-CM | POA: Diagnosis not present

## 2023-11-18 DIAGNOSIS — M25512 Pain in left shoulder: Secondary | ICD-10-CM | POA: Diagnosis not present

## 2023-11-18 DIAGNOSIS — R030 Elevated blood-pressure reading, without diagnosis of hypertension: Secondary | ICD-10-CM | POA: Diagnosis not present

## 2023-11-18 MED ORDER — KETOROLAC TROMETHAMINE 30 MG/ML IJ SOLN
30.0000 mg | Freq: Once | INTRAMUSCULAR | Status: AC
  Administered 2023-11-18: 30 mg via INTRAMUSCULAR

## 2023-11-18 NOTE — ED Triage Notes (Signed)
 Pt reports elevated blood pressure and increased in chronic left shoulder pain for last several days. Pt reports is currently taking 12.5 carvedilol . Reports took two doses of bp medication last night. States my left shoulder is arthritis but the pain is making my bp worse now. Denies any new injury.

## 2023-11-18 NOTE — Telephone Encounter (Signed)
 Called patient per request of Dr Mahlon, advised patient to be seen in ED now rather than waiting and patient has declined I did advise there is no ay to know if this is heart attack without testing and the sooner it is caught the better the outcome patient notes  I am ready to meet the lord patient advised he cannot go to ED now as he has to pick up grandson I advised heavily against driving with grandson and patient stated I'll drive slow it will be fine I again tried and noted Heart attacks  are silent killer and he may not know until it is too late and we would want his grandson to be involved in anyway, patient again noted he would drive slow. Advised patient once more Dr Donaciano medical opinion was that he be seen now and not wait till 11:20 pt states  he may be able to go to UC after picking up his grandson but he stated will not go to the hospital for anything.   Patient refused medical advice at this time.

## 2023-11-18 NOTE — Telephone Encounter (Signed)
 Upon reviewing the triage note- I strongly disagree w/ the disposition and feel he needs to go to the ER ASAP  They indicate nausea, central chest pain, pain below L shoulder blade rated 8-10 that used to be intermittent but is now constant.  He has hx of DM, HTN, hyperlipidemia.  He was referred to Cards in August but did not schedule.  This presentation could be cardiac- particularly given his risk factors.  This is a triage error and needs to be reported after pt is contacted

## 2023-11-18 NOTE — Telephone Encounter (Signed)
 Copied from CRM [id number]. Topic: General - Other >> Nov 18, 2023  2:42 PM Robinson H wrote: Reason for CRM: Patient states he needs a note saying he can't go to jury duty tomorrow, states he can't focus since he's in pain from his arm, went to urgent care today and was given and injection.  Jayshaun [phone]

## 2023-11-18 NOTE — ED Notes (Signed)
 Multiple EKG's were printed due to paper error on EKG machine.

## 2023-11-18 NOTE — Telephone Encounter (Signed)
 Pt called back to UC and inquired about note to excuse from jury duty tomorrow. Consulted PA and reported unable to provide medical exemption for jury duty. Discussed with pt. Pt agitated but verbalized understanding.

## 2023-11-18 NOTE — ED Provider Notes (Signed)
 [facility]    CSN: [id number] Arrival date & time: 11/18/23  1014      History   Chief Complaint Chief Complaint  Patient presents with   Hypertension    HPI Shawn Ball. is a 63 y.o. male.   Patient presenting today with several day history of acute on chronic left shoulder pain.  He states this has been an ongoing issue and recent x-ray through primary care showed osteoarthritis.  Denies decreased range of motion, numbness, tingling, swelling, discoloration.  Has been trying numerous supplements and over-the-counter pain relievers with minimal relief.  Also has been concerned about his blood pressure and thinks that the pain may be elevating it.  He denies chest pain, shortness of breath, dizziness, blurry vision, mental status changes and has been consistent with his medication.  Does have a primary care provider that he follows with regularly.    Past Medical History:  Diagnosis Date   Allergy    Arthritis    Cataract    Chronic bronchitis    Chronic pain    per pt/ right shoulder pain, left leg and ankle   CKD (chronic kidney disease) stage 3, GFR 30-59 ml/min (HCC)    Costochondral chest pain    due to torn tendons.   CTS (carpal tunnel syndrome)    right hand   Diabetes mellitus without complication (HCC)    prediabetes/ no meds   Diverticulitis    GERD (gastroesophageal reflux disease)    History of anal fissures    Hyperlipemia    Hypertension    Osteoporosis    Rectal bleeding    occasional   Seasonal allergies     Patient Active Problem List   Diagnosis Date Noted   Constipation 09/25/2023   Acute viral conjunctivitis of right eye 08/28/2020   Routine general medical examination at a health care facility 05/01/2020   Hyperlipidemia LDL goal <70 03/15/2020   Hypertriglyceridemia 03/15/2020   Diabetes mellitus without complication (HCC)    Benign essential HTN 08/25/2019    Past Surgical History:  Procedure Laterality Date    ANKLE FRACTURE SURGERY  2005 / 2007   left ankle and  left leg/ have screws in leg and ankle   CARPAL TUNNEL RELEASE Left 07/29/2019   Procedure: CARPAL TUNNEL RELEASE;  Surgeon: Murrell Kuba, MD;  Location: [facility];  Service: Orthopedics;  Laterality: Left;  FOREARM BLOCK   EXAMINATION UNDER ANESTHESIA N/A 12/15/2012   Procedure: EXAM UNDER ANESTHESIA;  Surgeon: Camellia CHRISTELLA Blush, MD;  Location: [facility];  Service: General;  Laterality: N/A;   FRACTURE SURGERY  2005 and 2007   lt ankle x2   HEMORRHOIDECTOMY WITH HEMORRHOID BANDING N/A 12/15/2012   Procedure: EXCISIONAL HEMORRHOIDECTOMY WITH HEMORRHOID BANDING;  Surgeon: Camellia CHRISTELLA Blush, MD;  Location: [facility];  Service: General;  Laterality: N/A;   rotator cuff surgery      x 2/ right shoulder   TRIGGER FINGER RELEASE  07/23/2011   Procedure: Right hand- RELEASE TRIGGER FINGER/A-1 PULLEY;  Surgeon: Lamar LULLA Leonor Mickey., MD;  Location: [facility];  Service: Orthopedics;  Laterality: Right;   WRIST FRACTURE SURGERY  1984   lt with bone graft   WRIST SURGERY     left wrist       Home Medications    Prior to Admission medications   Medication Sig Start Date End Date Taking? Authorizing Provider  acetaminophen  (TYLENOL ) 500 MG tablet Take  500 mg by mouth every 6 (six) hours as needed for mild pain (pain score 1-3) or moderate pain (pain score 4-6).    [provider]  ASPIRIN 81 PO Take 1 tablet by mouth daily.    [provider]  Bioflavonoid Products (GRAPE SEED EXTRACT PO) Take by mouth. Patient not taking: Reported on 10/22/2023    [provider]  Biotin 5000 MCG CAPS 2 times a week 12/03/14   [provider]  carvedilol  (COREG ) 6.25 MG tablet Take 1 tablet (6.25 mg total) by mouth 2 (two) times daily with a meal. 05/22/23   Levora Reyes SAUNDERS, MD  cetirizine  (ZYRTEC ) 10 MG tablet Take 1 tablet (10 mg total) by mouth daily. 10/28/19   Duanne Butler DASEN, MD  Cinnamon 500 MG TABS Take by mouth.    [provider]  Collagen Hydrolysate, Bovine, POWD by Does not apply route. Tax inspector for arthritis    [provider]  FIBER COMPLETE PO Take by mouth. Vita Fusion fiber gummies    [provider]  Flavoring Agent (MANGO FLAVOR) POWD by Does not apply route.    [provider]  fluticasone  (FLONASE ) 50 MCG/ACT nasal spray Place 2 sprays into both nostrils daily. 04/27/18   Duanne Butler DASEN, MD  Garlic Oil 1000 MG CAPS  10/02/20   [provider]  Ginger 500 MG CAPS Take by mouth.    [provider]  Glucosamine-Chondroitin-MSM 500-200-150 MG TABS Take by mouth.    [provider]  Grapefruit Oil OIL by Does not apply route. Patient not taking: No sig reported    [provider]  L-Lysine 1000 MG TABS Take by mouth.    [provider]  Lancets (FREESTYLE) lancets Use as instructed 10/28/19   Duanne Butler DASEN, MD  linagliptin  (TRADJENTA ) 5 MG TABS tablet Take 1 tablet (5 mg total) by mouth daily. 05/22/23   Levora Reyes SAUNDERS, MD  losartan  (COZAAR ) 100 MG tablet Take 1 tablet (100 mg total) by mouth daily. 05/22/23   Levora Reyes SAUNDERS, MD  MAGNESIUM OXIDE PO Take 375 mg by mouth daily.    [provider]  mometasone -formoterol (DULERA) 100-5 MCG/ACT AERO Inhale 2 puffs into the lungs 2 (two) times daily. Patient not taking: Reported on 10/22/2023    [provider]  Multiple Vitamins-Minerals (VISION FORMULA EYE HEALTH PO)  06/02/17   [provider]  omega-3 acid ethyl esters (LOVAZA ) 1 g capsule Take 2 capsules (2 g total) by mouth 2 (two) times daily. 05/22/23   Levora Reyes SAUNDERS, MD  omeprazole  (PRILOSEC) 20 MG capsule Take 1 capsule (20 mg total) by mouth 2 (two) times daily before a meal. 05/22/23   Levora Reyes SAUNDERS, MD  [facility] VERIO test strip USE 1 STRIP TO CHECK GLUCOSE IN THE MORNING AND AT BEDTIME 10/27/23   Levora Reyes SAUNDERS, MD   pravastatin  (PRAVACHOL ) 20 MG tablet Take 1 tablet by mouth once daily 11/04/23   Levora Reyes SAUNDERS, MD    Family History Family History  Problem Relation Age of Onset   Heart disease Mother    Hyperlipidemia Mother    Diabetes Mother    Macular degeneration Mother    Cancer Father        skin   Macular degeneration Sister    Diabetes Brother    Colon cancer Neg Hx    Stomach cancer Neg Hx    Esophageal cancer Neg Hx    Rectal cancer Neg  Hx     Social History Social History   Tobacco Use   Smoking status: Never   Smokeless tobacco: Former    Types: Chew   Tobacco comments:    Quit age 72 y.o  Vaping Use   Vaping status: Never Used  Substance Use Topics   Alcohol use: No   Drug use: No     Allergies   Amlodipine, Codeine, and Sulfa antibiotics   Review of Systems Review of Systems Per HPI  Physical Exam Triage Vital Signs ED Triage Vitals [11/18/23 1023]  Encounter Vitals Group     BP (!) 144/84     Girls Systolic BP Percentile      Girls Diastolic BP Percentile      Boys Systolic BP Percentile      Boys Diastolic BP Percentile      Pulse Rate 75     Resp 20     Temp 98.1 F (36.7 C)     Temp Source Oral     SpO2 96 %     Weight      Height      Head Circumference      Peak Flow      Pain Score 6     Pain Loc      Pain Education      Exclude from Growth Chart    No data found.  Updated Vital Signs BP (!) 144/84 (BP Location: Right Arm)   Pulse 75   Temp 98.1 F (36.7 C) (Oral)   Resp 20   SpO2 96%   Visual Acuity Right Eye Distance:   Left Eye Distance:   Bilateral Distance:    Right Eye Near:   Left Eye Near:    Bilateral Near:     Physical Exam Vitals and nursing note reviewed.  Constitutional:      Appearance: Normal appearance.  HENT:     Head: Atraumatic.  Eyes:     Extraocular Movements: Extraocular movements intact.     Conjunctiva/sclera: Conjunctivae normal.  Cardiovascular:     Rate and Rhythm: Normal rate and  regular rhythm.  Pulmonary:     Effort: Pulmonary effort is normal.  Musculoskeletal:        General: Tenderness present. No swelling. Normal range of motion.     Cervical back: Normal range of motion and neck supple.     Comments: Diffuse tenderness to palpation particularly to the lateral deltoid insertion of the left shoulder.  Mild crepitus on range of motion.  Range of motion does appear intact, grip strength full and equal bilateral hands  Skin:    General: Skin is warm and dry.     Findings: No bruising or erythema.  Neurological:     General: No focal deficit present.     Mental Status: He is oriented to person, place, and time.     Cranial Nerves: No cranial nerve deficit.     Motor: No weakness.     Gait: Gait normal.     Comments: Left upper extremity neurovascularly intact  Psychiatric:        Mood and Affect: Mood normal.        Thought Content: Thought content normal.        Judgment: Judgment normal.      UC Treatments / Results  Labs (all labs ordered are listed, but only abnormal results are displayed) Labs Reviewed - No data to display  EKG   Radiology No results found.  Procedures  Procedures (including critical care time)  Medications Ordered in UC Medications  ketorolac  (TORADOL ) 30 MG/ML injection 30 mg (30 mg Intramuscular Given 11/18/23 1218)    Initial Impression / Assessment and Plan / UC Course  I have reviewed the triage vital signs and the nursing notes.  Pertinent labs & imaging results that were available during my care of the patient were reviewed by me and considered in my medical decision making (see chart for details).     EKG done today given mildly elevated blood pressure and concern for elevations of blood pressure.  This was reassuring, showing normal sinus rhythm at 67 bpm with no acute ST or T wave changes.  This blood pressure is mildly elevated today, discussed following up with primary care provider and continuing to monitor  home readings.  Continue current regimen for this.  Possibly secondary to pain, poor sleep due to pain as well as NSAID use.  Will cautiously give IM Toradol  for his acute on chronic left shoulder pain.  Also discussed supportive over-the-counter medications, home care for this and orthopedic follow-up if not improving.  Final Clinical Impressions(s) / UC Diagnoses   Final diagnoses:  Chronic left shoulder pain  Elevated blood pressure reading     Discharge Instructions      Continue monitoring your home blood pressure readings and follow-up with your primary care provider as soon as possible for a recheck on this.  Regarding your ongoing left shoulder pain, we have given you a shot of Toradol  today which is a strong anti-inflammatory pain medication.  Avoid taking ibuprofen , Aleve, Advil  for at least the next 48 hours while this medication is not effective but you may take Tylenol  up to 4 times daily as needed for breakthrough pain.  Continue the supplements and over-the-counter remedies that you are using for pain relief and follow-up with orthopedics for ongoing management of this.    ED Prescriptions   None    PDMP not reviewed this encounter.   Stuart Vernell Norris, [place] 11/18/23 1949

## 2023-11-18 NOTE — Discharge Instructions (Signed)
 Continue monitoring your home blood pressure readings and follow-up with your primary care provider as soon as possible for a recheck on this.  Regarding your ongoing left shoulder pain, we have given you a shot of Toradol  today which is a strong anti-inflammatory pain medication.  Avoid taking ibuprofen , Aleve, Advil  for at least the next 48 hours while this medication is not effective but you may take Tylenol  up to 4 times daily as needed for breakthrough pain.  Continue the supplements and over-the-counter remedies that you are using for pain relief and follow-up with orthopedics for ongoing management of this.

## 2023-11-18 NOTE — Telephone Encounter (Signed)
 FYI Only or Action Required?: FYI only for provider.  Patient was last seen in primary care on 10/13/2023 by Levora Reyes SAUNDERS, MD.  Called Nurse Triage reporting Arm Injury.  Symptoms began ongoing.  Interventions attempted: Nothing.  Symptoms are: gradually worsening.  Triage Disposition: See PCP When Office is Open (Within 3 Days)  Patient/caregiver understands and will follow disposition?: Yes    Copied from CRM [id number]. Topic: Clinical - Red Word Triage >> Nov 18, 2023  8:01 AM Pinkey ORN wrote: Red Word that prompted transfer to Nurse Triage: Worsening Pain Reason for Disposition  Systolic BP >= 160 OR Diastolic >= 100  [8] MODERATE pain (e.g., interferes with normal activities) AND [2] present > 3 days  Answer Assessment - Initial Assessment Questions 1. ONSET: When did the pain start?     Ongoing and worsening 2. LOCATION: Where is the pain located?     Left shoulder blade area 2-4 below the  3. PAIN: How bad is the pain? (Scale 0-10; or none, mild, moderate, severe)     7 to 8/10 4. WORK OR EXERCISE: Has there been any recent work or exercise that involved this part of the body?     no 5. CAUSE: What do you think is causing the arm pain?     unknown 6. OTHER SYMPTOMS: Do you have any other symptoms? (e.g., neck pain, swelling, rash, fever, numbness, weakness)     no 7. PREGNANCY: Is there any chance you are pregnant? When was your last menstrual period?     Na  Hurts all the time: used to be on and off but now the pain is constant and worsening.  Answer Assessment - Initial Assessment Questions 1. BLOOD PRESSURE: What is your blood pressure? Did you take at least two measurements 5 minutes apart?     131/86, 139/92, 149/94, 153/97   2. ONSET: When did you take your blood pressure?    Around July 2025 3. HOW: How did you take your blood pressure? (e.g., automatic home BP monitor, visiting nurse)     automatic 4. HISTORY: Do you  have a history of high blood pressure?     yes 5. MEDICINES: Are you taking any medicines for blood pressure? Have you missed any doses recently?     no 6. OTHER SYMPTOMS: Do you have any symptoms? (e.g., blurred vision, chest pain, difficulty breathing, headache, weakness)     Nausea, chest - burning  7. PREGNANCY: Is there any chance you are pregnant? When was your last menstrual period?     na  Protocols used: Arm Pain-A-AH, Blood Pressure - High-A-AH

## 2023-11-18 NOTE — Telephone Encounter (Signed)
 Called and LM informing patient we cannot write this letter considering we did not see him for this concern at this time and he should reach out to UC where he was seen today

## 2023-11-24 ENCOUNTER — Ambulatory Visit: Payer: Self-pay | Admitting: Family Medicine

## 2023-11-24 ENCOUNTER — Encounter: Payer: Self-pay | Admitting: Family Medicine

## 2023-11-24 ENCOUNTER — Ambulatory Visit: Admitting: Family Medicine

## 2023-11-24 ENCOUNTER — Ambulatory Visit (INDEPENDENT_AMBULATORY_CARE_PROVIDER_SITE_OTHER)
Admission: RE | Admit: 2023-11-24 | Discharge: 2023-11-24 | Disposition: A | Discharge status: Home / Self Care | Source: Ambulatory Visit | Attending: Family Medicine | Admitting: Family Medicine

## 2023-11-24 VITALS — BP 138/88 | HR 67 | Temp 98.1°F | Resp 16 | Ht 71.0 in | Wt 214.2 lb

## 2023-11-24 DIAGNOSIS — Z23 Encounter for immunization: Secondary | ICD-10-CM | POA: Diagnosis not present

## 2023-11-24 DIAGNOSIS — M25512 Pain in left shoulder: Secondary | ICD-10-CM

## 2023-11-24 DIAGNOSIS — M542 Cervicalgia: Secondary | ICD-10-CM

## 2023-11-24 DIAGNOSIS — M79602 Pain in left arm: Secondary | ICD-10-CM

## 2023-11-24 DIAGNOSIS — G8929 Other chronic pain: Secondary | ICD-10-CM

## 2023-11-24 DIAGNOSIS — I10 Essential (primary) hypertension: Secondary | ICD-10-CM

## 2023-11-24 MED ORDER — PREDNISONE 20 MG PO TABS: 40.0000 mg | ORAL_TABLET | Freq: Every day | ORAL | 0 refills | Status: DC

## 2023-11-24 NOTE — Progress Notes (Signed)
 Subjective:  Patient ID: Shawn JONELLE Theo Mickey., male    DOB: [birth year]  Age: 63 y.o. MRN: [id number]  CC:  Chief Complaint  Patient presents with   Shoulder Pain    Pt went to UC recently for severe shoulder pain and triage nurse noted comment of chest pain, notes not feeling better after UC visit, would like a referral to Ortho    Hypertension    Pt has had high BP readings recently and hasn't been feeling well, recent readings 143/92, 138/90, 140/90, 145/96, 147/95, 145/93, 155/98, 150/100    HPI Shawn Ball. presents for  Urgent care follow-up for above   Shoulder pain Triage note reviewed from September 16.  Pain in the shoulder blade at that time.  Intermittent pain but then was becoming constant and worsening.  Did note that he was having some burning sensation in his chest, nausea and elevated blood pressure.  Close follow-up with PCP initially recommended, then on review by my colleague, concern for possible cardiac cause, given risk factors of diabetes, hypertension, hyperlipidemia.  ER evaluation was recommended. He was seen in urgent care on September 16.  History at that time indicated acute on chronic left shoulder pain, and concern with elevation in blood pressure, thinking that the pain may be elevating it.  He denied chest pain dyspnea dizziness blurry vision or mental status changes and has been consistent with his medication at that time. Blood pressure 144/84 at that visit.  EKG was performed showing normal sinus rhythm at 67 without any ST or T wave changes.  Blood pressure elevation was thought to be due to to pain, poor sleep due to pain as well as NSAID use. On exam he had diffuse tenderness to palpation particularly to the lateral deltoid insertion of the left shoulder mild crepitus with range of motion but intact range of motion.  He was given Toradol  30 mg injection.  Today - notes pain in left upper arm, soreness moves into left arm, weakness feeling into  shoulder itself, no arm weakness, just soreness into arm.  No neck pain, but popping in neck at times. Pain in shoulder did start after playing with kids, they jumped on shoulder - sore in neck and shoulder. Throbbing pain. No fall/injury. Gnawing pain during day.  No pain in shoulder joint itself.  XR of L shoulder 05/2021:IMPRESSION: 1. No acute fracture or dislocation. 2. Degenerative changes as above, worse at the acromioclavicular joint.   Tx; ibuprofen  400mg  at night, tylenol  during the morning, day Has tried omega 3 XL - min relief of pain.  Hyaluronic acid - minimal improvement.  Would like to see [facility]o at [facility].   Costochondritis in past - no new chest pain/heaviness/pressure.    Hypertension: Recent elevations as above, thought to be potentially due to pain. Of note I did discuss cardiology follow-up at his August 11 visit when he had some fleeting episodes of central chest pain at that time but they were atypical, nonexertional and possibly gastrointestinal with heartburn or gas sensation.  Referral was placed, but he did not receive call.   He has continued to take carvedilol  6.25 mg twice daily, losartan  100 mg daily for his hypertension. No missed doses.  Home readings as above, 140s to 150s over 90s to 100. As above no new chest pains.   BP Readings from Last 3 Encounters:  11/24/23 138/88  11/18/23 (!) 144/84  10/22/23 130/85   Lab Results  Component Value Date  CREATININE 1.00 10/13/2023   Hx of DM -  Lab Results  Component Value Date   HGBA1C 6.9 (H) 10/13/2023    History Patient Active Problem List   Diagnosis Date Noted   Constipation 09/25/2023   Acute viral conjunctivitis of right eye 08/28/2020   Routine general medical examination at a health care facility 05/01/2020   Hyperlipidemia LDL goal <70 03/15/2020   Hypertriglyceridemia 03/15/2020   Diabetes mellitus without complication (HCC)    Benign essential HTN 08/25/2019   Past Medical  History:  Diagnosis Date   Allergy    Arthritis    Cataract    Chronic bronchitis    Chronic pain    per pt/ right shoulder pain, left leg and ankle   CKD (chronic kidney disease) stage 3, GFR 30-59 ml/min (HCC)    Costochondral chest pain    due to torn tendons.   CTS (carpal tunnel syndrome)    right hand   Diabetes mellitus without complication (HCC)    prediabetes/ no meds   Diverticulitis    GERD (gastroesophageal reflux disease)    History of anal fissures    Hyperlipemia    Hypertension    Osteoporosis    Rectal bleeding    occasional   Seasonal allergies    Past Surgical History:  Procedure Laterality Date   ANKLE FRACTURE SURGERY  2005 / 2007   left ankle and  left leg/ have screws in leg and ankle   CARPAL TUNNEL RELEASE Left 07/29/2019   Procedure: CARPAL TUNNEL RELEASE;  Surgeon: Murrell Kuba, MD;  Location: [facility];  Service: Orthopedics;  Laterality: Left;  FOREARM BLOCK   EXAMINATION UNDER ANESTHESIA N/A 12/15/2012   Procedure: EXAM UNDER ANESTHESIA;  Surgeon: Camellia CHRISTELLA Blush, MD;  Location: [facility];  Service: General;  Laterality: N/A;   FRACTURE SURGERY  2005 and 2007   lt ankle x2   HEMORRHOIDECTOMY WITH HEMORRHOID BANDING N/A 12/15/2012   Procedure: EXCISIONAL HEMORRHOIDECTOMY WITH HEMORRHOID BANDING;  Surgeon: Camellia CHRISTELLA Blush, MD;  Location: [facility];  Service: General;  Laterality: N/A;   rotator cuff surgery      x 2/ right shoulder   TRIGGER FINGER RELEASE  07/23/2011   Procedure: Right hand- RELEASE TRIGGER FINGER/A-1 PULLEY;  Surgeon: Lamar LULLA Leonor Mickey., MD;  Location: [facility];  Service: Orthopedics;  Laterality: Right;   WRIST FRACTURE SURGERY  1984   lt with bone graft   WRIST SURGERY     left wrist   Allergies  Allergen Reactions   Amlodipine Other (See Comments)    Per pt - damage to kidney's   Codeine Nausea And Vomiting   Sulfa Antibiotics Nausea Only    nauseated    Prior to Admission medications   Medication Sig Start Date End Date Taking? Authorizing Provider  acetaminophen  (TYLENOL ) 500 MG tablet Take 500 mg by mouth every 6 (six) hours as needed for mild pain (pain score 1-3) or moderate pain (pain score 4-6).   Yes [provider]  ASPIRIN 81 PO Take 1 tablet by mouth daily.   Yes [provider]  Biotin 5000 MCG CAPS 2 times a week 12/03/14  Yes [provider]  carvedilol  (COREG ) 6.25 MG tablet Take 1 tablet (6.25 mg total) by mouth 2 (two) times daily with a meal. 05/22/23  Yes Levora Shawn SAUNDERS, MD  cetirizine  (ZYRTEC ) 10 MG tablet Take 1 tablet (10 mg total) by mouth daily. 10/28/19  Yes Duanne Butler DASEN, MD  Cinnamon 500 MG TABS Take by mouth.   Yes [provider]  Collagen Hydrolysate, Bovine, POWD by Does not apply route. Takes Hospital doctor for arthritis   Yes [provider]  FIBER COMPLETE PO Take by mouth. Vita Fusion fiber gummies   Yes [provider]  Flavoring Agent (MANGO FLAVOR) POWD by Does not apply route.   Yes [provider]  fluticasone  (FLONASE ) 50 MCG/ACT nasal spray Place 2 sprays into both nostrils daily. 04/27/18  Yes Duanne Butler DASEN, MD  Garlic Oil 1000 MG CAPS  10/02/20  Yes [provider]  Ginger 500 MG CAPS Take by mouth.   Yes [provider]  Glucosamine-Chondroitin-MSM 500-200-150 MG TABS Take by mouth.   Yes [provider]  L-Lysine 1000 MG TABS Take by mouth.   Yes [provider]  Lancets (FREESTYLE) lancets Use as instructed 10/28/19  Yes Duanne Butler DASEN, MD  linagliptin  (TRADJENTA ) 5 MG TABS tablet Take 1 tablet (5 mg total) by mouth daily. 05/22/23  Yes Levora Shawn SAUNDERS, MD  losartan  (COZAAR ) 100 MG tablet Take 1 tablet (100 mg total) by mouth daily. 05/22/23  Yes Levora Shawn SAUNDERS, MD  MAGNESIUM OXIDE PO Take 375 mg by mouth daily.   Yes [provider]  Multiple Vitamins-Minerals (VISION FORMULA EYE HEALTH  PO)  06/02/17  Yes [provider]  omega-3 acid ethyl esters (LOVAZA ) 1 g capsule Take 2 capsules (2 g total) by mouth 2 (two) times daily. 05/22/23  Yes Levora Shawn SAUNDERS, MD  omeprazole  (PRILOSEC) 20 MG capsule Take 1 capsule (20 mg total) by mouth 2 (two) times daily before a meal. 05/22/23  Yes Levora Shawn SAUNDERS, MD  [facility] VERIO test strip USE 1 STRIP TO CHECK GLUCOSE IN THE MORNING AND AT BEDTIME 10/27/23  Yes Levora Shawn SAUNDERS, MD  pravastatin  (PRAVACHOL ) 20 MG tablet Take 1 tablet by mouth once daily 11/04/23  Yes Levora Shawn SAUNDERS, MD  Bioflavonoid Products (GRAPE SEED EXTRACT PO) Take by mouth. Patient not taking: Reported on 11/24/2023    [provider]  Grapefruit Oil OIL by Does not apply route. Patient not taking: Reported on 11/24/2023    [provider]  mometasone -formoterol (DULERA) 100-5 MCG/ACT AERO Inhale 2 puffs into the lungs 2 (two) times daily. Patient not taking: Reported on 11/24/2023    [provider]   Social History   Socioeconomic History   Marital status: Divorced    Spouse name: Not on file   Number of children: 1   Years of education: Not on file   Highest education level: Some college, no degree  Occupational History   Occupation: disabled  Tobacco Use   Smoking status: Never   Smokeless tobacco: Former    Types: Chew   Tobacco comments:    Quit age 41 y.o  Vaping Use   Vaping status: Never Used  Substance and Sexual Activity   Alcohol use: No   Drug use: No   Sexual activity: Not Currently  Other Topics Concern   Not on file  Social History Narrative   Not on file   Social Drivers of Health   Financial Resource Strain: Low Risk  (10/12/2023)   Overall Financial Resource Strain (CARDIA)    Difficulty of Paying Living Expenses: Not very hard  Food Insecurity: No Food Insecurity (10/12/2023)   Hunger Vital Sign    Worried About Running Out of Food in the Last Year: Never true  Ran Out of Food in the Last  Year: Never true  Transportation Needs: No Transportation Needs (10/12/2023)   PRAPARE - Administrator, Civil Service (Medical): No    Lack of Transportation (Non-Medical): No  Physical Activity: Inactive (10/12/2023)   Exercise Vital Sign    Days of Exercise per Week: 0 days    Minutes of Exercise per Session: Not on file  Stress: No Stress Concern Present (10/12/2023)   [employer] of Occupational Health - Occupational Stress Questionnaire    Feeling of Stress: Not at all  Social Connections: Moderately Integrated (10/12/2023)   Social Connection and Isolation Panel    Frequency of Communication with Friends and Family: More than three times a week    Frequency of Social Gatherings with Friends and Family: More than three times a week    Attends Religious Services: More than 4 times per year    Active Member of [employer] or Organizations: Yes    Attends Engineer, structural: More than 4 times per year    Marital Status: Divorced  Catering manager Violence: Not on file    Review of Systems   Objective:   Vitals:   11/24/23 0856  BP: 138/88  Pulse: 67  Resp: 16  Temp: 98.1 F (36.7 C)  TempSrc: Temporal  SpO2: 97%  Weight: 214 lb 3.2 oz (97.2 kg)  Height: 5' 11 (1.803 m)     Physical Exam Vitals reviewed.  Constitutional:      Appearance: He is well-developed.  HENT:     Head: Normocephalic and atraumatic.  Neck:     Vascular: No carotid bruit or JVD.  Cardiovascular:     Rate and Rhythm: Normal rate and regular rhythm.     Heart sounds: Normal heart sounds. No murmur heard. Pulmonary:     Effort: Pulmonary effort is normal.     Breath sounds: Normal breath sounds. No rales.  Musculoskeletal:     Right lower leg: No edema.     Left lower leg: No edema.     Comments: C-spine, no midline bony tenderness.  Decreased extension, rotation and lateral flexion, discomfort into the left lateral neck with right rotation, right lateral flexion but  does not radiate down arm.  Left shoulder, no focal bony tenderness, full range of motion.  Equal upper extremity strength. Left upper arm, reports discomfort over the triceps, diffusely upper arm.  No bony tenderness.  Arm strength intact, elbow range of motion intact.  Grip strength intact.  Skin:    General: Skin is warm and dry.  Neurological:     Mental Status: He is alert and oriented to person, place, and time.  Psychiatric:        Mood and Affect: Mood normal.     Assessment & Plan:  Shawn Wampole. is a 63 y.o. male . Left arm pain - Plan: Ambulatory referral to Orthopedic Surgery, DG Cervical Spine Complete Chronic left shoulder pain - Plan: Ambulatory referral to Orthopedic Surgery Neck pain - Plan: Ambulatory referral to Orthopedic Surgery, predniSONE  (DELTASONE ) 20 MG tablet, DG Cervical Spine Complete  - History of degenerative changes of shoulder but his current pain appears to have started after some neck discomfort as above past 3 months.  Some discomfort on C-spine range of motion.  No weakness.  Suspect possible cervical radiculopathy causing left arm pain.  Check C-spine imaging, prednisone  short course at 40 mg daily as insufficient treatment with over-the-counter medications including ibuprofen .  Potential side effects and risk discussed, close monitoring of blood sugars given underlying diabetes but last A1c looked good.  Will also have him follow-up with orthopedics given persistent symptoms, left shoulder pain intermittently.  RTC/ER precautions given.    Need for influenza vaccination - Plan: Flu vaccine trivalent PF, 6mos and older(Flulaval,Afluria,Fluarix,Fluzone)  Elevated blood pressure reading with diagnosis of hypertension  - Elevated blood pressure likely due to pain response.  Improved in office.  Decided against medication changes at this time.  Prednisone  for arm pain, shoulder pain as above should be helpful.  Continue to monitor home readings and  adjust plan accordingly.  Denies chest pain at this time, no new chest pain, unlikely cardiac but given his health history, previous referral was placed to cardiology and advised that he continue to follow-up with them.  Phone number provided for him to schedule appointment.  ER precautions given. Meds ordered this encounter  Medications   predniSONE  (DELTASONE ) 20 MG tablet    Sig: Take 2 tablets (40 mg total) by mouth daily with breakfast.    Dispense:  10 tablet    Refill:  0   Patient Instructions  Arm pain could be related to pinched nerve in the neck. We can try a course of prednisone , watch blood sugars, other side effects as we discussed.  Please have x-ray of your neck at the [facility] location below.  I will refer you to Ortho as well for follow-up of this pain as well as some of the intermittent shoulder pain previously.  Again that could be related to your neck or combination with underlying arthritis.  Stop ibuprofen  while taking prednisone , okay to continue Tylenol .  Let me know how things are going over the next week. [facility] or xray: Walk in 8:30-4:30 during weekdays, no appointment needed [address]  [place], [place] [address]   Elevated blood pressure may be due to pain - that is very common to see. Once we get pain better controlled I think you will see the levels normalize.  No meds changes for now.   I do want you to still follow up with cardiology. Here is their number. Return to the clinic or go to the nearest emergency room if any of your symptoms worsen or new symptoms occur.   [facility] at [facility] [address] [place] [address]  [phone]     Signed,   Shawn Pines, MD [facility], [facility] 11/24/23 9:48 AM

## 2023-11-24 NOTE — Patient Instructions (Addendum)
 Arm pain could be related to pinched nerve in the neck. We can try a course of prednisone , watch blood sugars, other side effects as we discussed.  Please have x-ray of your neck at the [facility] location below.  I will refer you to Ortho as well for follow-up of this pain as well as some of the intermittent shoulder pain previously.  Again that could be related to your neck or combination with underlying arthritis.  Stop ibuprofen  while taking prednisone , okay to continue Tylenol .  Let me know how things are going over the next week. [facility] or xray: Walk in 8:30-4:30 during weekdays, no appointment needed [address]  [place], [place] [address]   Elevated blood pressure may be due to pain - that is very common to see. Once we get pain better controlled I think you will see the levels normalize.  No meds changes for now.   I do want you to still follow up with cardiology. Here is their number. Return to the clinic or go to the nearest emergency room if any of your symptoms worsen or new symptoms occur.   [facility] at [facility] [address] [place] [address]  [phone]

## 2023-11-27 ENCOUNTER — Encounter: Payer: Self-pay | Admitting: Family Medicine

## 2023-11-27 DIAGNOSIS — M542 Cervicalgia: Secondary | ICD-10-CM

## 2023-11-28 MED ORDER — PREDNISONE 20 MG PO TABS: ORAL_TABLET | ORAL | 0 refills | Status: DC

## 2023-11-28 NOTE — Telephone Encounter (Signed)
 Please advise, patient is requesting more prednisone  to last until appt Monday with ortho.

## 2023-11-28 NOTE — Telephone Encounter (Signed)
 Short-term extension followed by taper ordered.

## 2023-11-30 ENCOUNTER — Other Ambulatory Visit: Payer: Self-pay | Admitting: Family Medicine

## 2023-11-30 DIAGNOSIS — E781 Pure hyperglyceridemia: Secondary | ICD-10-CM

## 2023-12-01 ENCOUNTER — Ambulatory Visit (INDEPENDENT_AMBULATORY_CARE_PROVIDER_SITE_OTHER): Admitting: Student

## 2023-12-01 ENCOUNTER — Telehealth: Payer: Self-pay

## 2023-12-01 ENCOUNTER — Ambulatory Visit (HOSPITAL_BASED_OUTPATIENT_CLINIC_OR_DEPARTMENT_OTHER)

## 2023-12-01 DIAGNOSIS — M19012 Primary osteoarthritis, left shoulder: Secondary | ICD-10-CM | POA: Diagnosis not present

## 2023-12-01 DIAGNOSIS — M5412 Radiculopathy, cervical region: Secondary | ICD-10-CM

## 2023-12-01 NOTE — Telephone Encounter (Unsigned)
 Copied from CRM #[id number]. Topic: General - Other >> Dec 01, 2023 10:38 AM Burnard DEL wrote: Reason for CRM: Sabrina from [facility] called to see if patient has had any recent xray  on left shoulder.Patient is in office seeing them now and he told them that he had an xray recently on his left shoulder.However they are only seeing xray from 2023. Please advise.

## 2023-12-01 NOTE — Progress Notes (Signed)
 Chief Complaint: Left shoulder pain    Discussed the use of AI scribe software for clinical note transcription with the patient, who gave verbal consent to proceed.  History of Present Illness Shawn Ball. Shawn Ball is a 63 year old male with arthritis and diabetes who presents with left shoulder and arm pain.  He has experienced left shoulder pain for two and a half years, with popping and cracking sounds. The pain radiates down the arm and disrupts sleep. A recent Toradol  injection provided 24-hour relief. He is on a prednisone  taper, which has improved pain and sleep. A recent incident where a child jumped on his back may have triggered or worsened symptoms. He experiences neck and arm pain, possibly due to a pinched nerve, and has difficulty sleeping. He has undergone surgeries for carpal tunnel, trigger finger, and other orthopedic procedures. He expresses concerns about past surgical experiences. He has severe arthritis and back problems affecting his spine and joints.   Surgical History:   None of left shoulder  PMH/PSH/Family History/Social History/Meds/Allergies:    Past Medical History:  Diagnosis Date   Allergy    Arthritis    Cataract    Chronic bronchitis    Chronic pain    per pt/ right shoulder pain, left leg and ankle   CKD (chronic kidney disease) stage 3, GFR 30-59 ml/min (HCC)    Costochondral chest pain    due to torn tendons.   CTS (carpal tunnel syndrome)    right hand   Diabetes mellitus without complication (HCC)    prediabetes/ no meds   Diverticulitis    GERD (gastroesophageal reflux disease)    History of anal fissures    Hyperlipemia    Hypertension    Osteoporosis    Rectal bleeding    occasional   Seasonal allergies    Past Surgical History:  Procedure Laterality Date   ANKLE FRACTURE SURGERY  2005 / 2007   left ankle and  left leg/ have screws in leg and ankle   CARPAL TUNNEL RELEASE Left 07/29/2019    Procedure: CARPAL TUNNEL RELEASE;  Surgeon: Murrell Kuba, MD;  Location: [facility];  Service: Orthopedics;  Laterality: Left;  FOREARM BLOCK   EXAMINATION UNDER ANESTHESIA N/A 12/15/2012   Procedure: EXAM UNDER ANESTHESIA;  Surgeon: Camellia CHRISTELLA Blush, MD;  Location: [facility];  Service: General;  Laterality: N/A;   FRACTURE SURGERY  2005 and 2007   lt ankle x2   HEMORRHOIDECTOMY WITH HEMORRHOID BANDING N/A 12/15/2012   Procedure: EXCISIONAL HEMORRHOIDECTOMY WITH HEMORRHOID BANDING;  Surgeon: Camellia CHRISTELLA Blush, MD;  Location: [facility];  Service: General;  Laterality: N/A;   rotator cuff surgery      x 2/ right shoulder   TRIGGER FINGER RELEASE  07/23/2011   Procedure: Right hand- RELEASE TRIGGER FINGER/A-1 PULLEY;  Surgeon: Lamar LULLA Leonor Ball., MD;  Location: [facility];  Service: Orthopedics;  Laterality: Right;   WRIST FRACTURE SURGERY  1984   lt with bone graft   WRIST SURGERY     left wrist   Social History   Socioeconomic History   Marital status: Divorced    Spouse name: Not on file   Number of children: 1   Years of education: Not on file   Highest education  level: Some college, no degree  Occupational History   Occupation: disabled  Tobacco Use   Smoking status: Never   Smokeless tobacco: Former    Types: Chew   Tobacco comments:    Quit age 70 y.o  Vaping Use   Vaping status: Never Used  Substance and Sexual Activity   Alcohol use: No   Drug use: No   Sexual activity: Not Currently  Other Topics Concern   Not on file  Social History Narrative   Not on file   Social Drivers of Health   Financial Resource Strain: Low Risk  (10/12/2023)   Overall Financial Resource Strain (CARDIA)    Difficulty of Paying Living Expenses: Not very hard  Food Insecurity: No Food Insecurity (10/12/2023)   Hunger Vital Sign    Worried About Running Out of Food in the Last Year: Never true    Ran Out of Food in the Last Year:  Never true  Transportation Needs: No Transportation Needs (10/12/2023)   PRAPARE - Administrator, Civil Service (Medical): No    Lack of Transportation (Non-Medical): No  Physical Activity: Inactive (10/12/2023)   Exercise Vital Sign    Days of Exercise per Week: 0 days    Minutes of Exercise per Session: Not on file  Stress: No Stress Concern Present (10/12/2023)   [employer] of Occupational Health - Occupational Stress Questionnaire    Feeling of Stress: Not at all  Social Connections: Moderately Integrated (10/12/2023)   Social Connection and Isolation Panel    Frequency of Communication with Friends and Family: More than three times a week    Frequency of Social Gatherings with Friends and Family: More than three times a week    Attends Religious Services: More than 4 times per year    Active Member of [employer] or Organizations: Yes    Attends Engineer, structural: More than 4 times per year    Marital Status: Divorced   Family History  Problem Relation Age of Onset   Heart disease Mother    Hyperlipidemia Mother    Diabetes Mother    Macular degeneration Mother    Cancer Father        skin   Macular degeneration Sister    Diabetes Brother    Colon cancer Neg Hx    Stomach cancer Neg Hx    Esophageal cancer Neg Hx    Rectal cancer Neg Hx    Allergies  Allergen Reactions   Amlodipine Other (See Comments)    Per pt - damage to kidney's   Codeine Nausea And Vomiting   Sulfa Antibiotics Nausea Only    nauseated   Current Outpatient Medications  Medication Sig Dispense Refill   acetaminophen  (TYLENOL ) 500 MG tablet Take 500 mg by mouth every 6 (six) hours as needed for mild pain (pain score 1-3) or moderate pain (pain score 4-6).     ASPIRIN 81 PO Take 1 tablet by mouth daily.     Bioflavonoid Products (GRAPE SEED EXTRACT PO) Take by mouth. (Patient not taking: Reported on 11/24/2023)     Biotin 5000 MCG CAPS 2 times a week     carvedilol  (COREG )  6.25 MG tablet Take 1 tablet (6.25 mg total) by mouth 2 (two) times daily with a meal. 180 tablet 2   cetirizine  (ZYRTEC ) 10 MG tablet Take 1 tablet (10 mg total) by mouth daily. 90 tablet 3   Cinnamon 500 MG TABS Take by mouth.  Collagen Hydrolysate, Bovine, POWD by Does not apply route. Takes Hospital doctor for arthritis     FIBER COMPLETE PO Take by mouth. Vita Fusion fiber gummies     Flavoring Agent (MANGO FLAVOR) POWD by Does not apply route.     fluticasone  (FLONASE ) 50 MCG/ACT nasal spray Place 2 sprays into both nostrils daily. 16 g 5   Garlic Oil 1000 MG CAPS      Ginger 500 MG CAPS Take by mouth.     Glucosamine-Chondroitin-MSM 500-200-150 MG TABS Take by mouth.     Grapefruit Oil OIL by Does not apply route. (Patient not taking: Reported on 11/24/2023)     L-Lysine 1000 MG TABS Take by mouth.     Lancets (FREESTYLE) lancets Use as instructed 100 each 12   linagliptin  (TRADJENTA ) 5 MG TABS tablet Take 1 tablet (5 mg total) by mouth daily. 90 tablet 2   losartan  (COZAAR ) 100 MG tablet Take 1 tablet (100 mg total) by mouth daily. 90 tablet 2   MAGNESIUM OXIDE PO Take 375 mg by mouth daily.     mometasone -formoterol (DULERA) 100-5 MCG/ACT AERO Inhale 2 puffs into the lungs 2 (two) times daily. (Patient not taking: Reported on 11/24/2023)     Multiple Vitamins-Minerals (VISION FORMULA EYE HEALTH PO)      omega-3 acid ethyl esters (LOVAZA ) 1 g capsule Take 2 capsules by mouth twice daily 120 capsule 0   omeprazole  (PRILOSEC) 20 MG capsule Take 1 capsule (20 mg total) by mouth 2 (two) times daily before a meal. 180 capsule 3   ONETOUCH VERIO test strip USE 1 STRIP TO CHECK GLUCOSE IN THE MORNING AND AT BEDTIME 100 each 0   pravastatin  (PRAVACHOL ) 20 MG tablet Take 1 tablet by mouth once daily 90 tablet 0   predniSONE  (DELTASONE ) 20 MG tablet Take 2 tabs by mouth daily for 2 days followed by 1 tab by mouth daily for 2 days followed by one half tab daily for 2 days. 7 tablet 0   No current  facility-administered medications for this visit.   No results found.  Review of Systems:   A ROS was performed including pertinent positives and negatives as documented in the HPI.  Physical Exam :   Constitutional: NAD and appears stated age Neurological: Alert and oriented Psych: Appropriate affect and cooperative There were no vitals taken for this visit.   Comprehensive Musculoskeletal Exam:    Tenderness with palpation over the lower midline of the cervical spine.  Cervical range of motion is decreased particularly with rotation and sidebending to the left.  Positive Spurling's.  No significant tenderness in the left shoulder.  Active left shoulder flexion to 140 degrees.  Grip strength of the left hand is 3/5 compared to 4/5 on contralateral side.  Imaging:   Xray reviewed from 11/24/2023 (cervical spine 4 views): Diffuse degenerative disc disease with anterior osteophyte formation.  Joint space narrowing is most notable at C6-C7.  I personally reviewed and interpreted the radiographs.      Assessment & Plan Cervical radiculopathy due to cervical spondylosis   Chronic neck pain with radicular symptoms is likely due to cervical spondylosis. X-ray shows diffuse multilevel degenerative changes. Prednisone  provided temporary relief.  Symptoms generally follow a C6-C7 pattern.  He does have some decreased grip strength into the left hand.  Based on x-rays from 2023 he does have both glenohumeral and acromioclavicular joint osteoarthritis but do not suspect this is the main cause of his pain.  Will plan to  proceed with a cervical MRI to assess for nerve impingement and to assess candidacy for ESI which I believe may be a good treatment option for him.  Will plan to have him follow-up with Duwaine Pouch to go over MRI and discuss treatment plan.       I personally saw and evaluated the patient, and participated in the management and treatment plan.  Shawn Reveal,  PA-C Orthopedics

## 2023-12-01 NOTE — Telephone Encounter (Signed)
 Called Shawn Ball back and stated I am seeing the same thing they are with the left shoulder X-ray.

## 2023-12-04 ENCOUNTER — Telehealth (HOSPITAL_BASED_OUTPATIENT_CLINIC_OR_DEPARTMENT_OTHER): Payer: Self-pay | Admitting: Student

## 2023-12-04 ENCOUNTER — Telehealth: Payer: Self-pay

## 2023-12-04 DIAGNOSIS — M542 Cervicalgia: Secondary | ICD-10-CM

## 2023-12-04 MED ORDER — CYCLOBENZAPRINE HCL 5 MG PO TABS: 5.0000 mg | ORAL_TABLET | Freq: Three times a day (TID) | ORAL | 1 refills | Status: DC | PRN

## 2023-12-04 NOTE — Addendum Note (Signed)
 Addended by: Keslyn Teater R on: 12/04/2023 07:11 PM   Modules accepted: Orders

## 2023-12-04 NOTE — Telephone Encounter (Signed)
 Tried to call again. No answer. Went to [employer] again.

## 2023-12-04 NOTE — Telephone Encounter (Signed)
 Patient states he is in a lot of pain and Leonce told him if he needs pain meds to let him know to call them in the [facility]. He has his MRI sch for Sat. Best contact [phone]

## 2023-12-04 NOTE — Telephone Encounter (Signed)
 Called pt - he has taken MRI before. No concern for MRI tomorrow and remaining still. He has just had some increase in the neck pain as he is tapering down on the prednisone .  Plan for orthopedic follow-up after MRI.  Requesting a muscle relaxer to see if that can help temporarily with the neck pain.  I think this is reasonable, Flexeril 5 mg sent to his pharmacy, 1-2 every 8 hours as needed but discussed the potential side effects and risks, sedation and avoidance of operating machinery or driving on that medication.  Recommended starting at bedtime initially.  All questions answered and understanding of plan expressed.

## 2023-12-04 NOTE — Telephone Encounter (Signed)
 Patient is requesting muscle relaxant to help him stay still during MRI. Please advise.  I see that he has contacted another provider and they will send in Meloxicam for pain.   Copied from CRM [id number]. Topic: Clinical - Medical Advice >> Dec 04, 2023 12:55 PM Franky GRADE wrote: Reason for CRM: Patient is scheduled to have an MRI tomorrow, and is requesting a muscle relaxant that will help him stay still while he does the MRI.

## 2023-12-04 NOTE — Telephone Encounter (Signed)
 Tried to call patient. No answer. Ask him to call us  back.

## 2023-12-05 ENCOUNTER — Ambulatory Visit
Admission: RE | Admit: 2023-12-05 | Discharge: 2023-12-05 | Disposition: A | Discharge status: Home / Self Care | Source: Ambulatory Visit | Attending: Student | Admitting: Student

## 2023-12-05 DIAGNOSIS — M5412 Radiculopathy, cervical region: Secondary | ICD-10-CM

## 2023-12-05 NOTE — Telephone Encounter (Signed)
 Called pt and he stated that his PCP gave him Flexeril and it has one refill on it and will give us  a call if he needs anything

## 2023-12-06 ENCOUNTER — Other Ambulatory Visit

## 2023-12-11 ENCOUNTER — Other Ambulatory Visit: Payer: Self-pay | Admitting: Family Medicine

## 2023-12-11 DIAGNOSIS — E1122 Type 2 diabetes mellitus with diabetic chronic kidney disease: Secondary | ICD-10-CM

## 2023-12-18 ENCOUNTER — Ambulatory Visit: Admitting: Physical Medicine and Rehabilitation

## 2023-12-18 DIAGNOSIS — M4802 Spinal stenosis, cervical region: Secondary | ICD-10-CM | POA: Diagnosis not present

## 2023-12-18 DIAGNOSIS — M5412 Radiculopathy, cervical region: Secondary | ICD-10-CM | POA: Diagnosis not present

## 2023-12-18 DIAGNOSIS — M5010 Cervical disc disorder with radiculopathy, unspecified cervical region: Secondary | ICD-10-CM

## 2023-12-18 MED ORDER — CYCLOBENZAPRINE HCL 10 MG PO TABS: 10.0000 mg | ORAL_TABLET | Freq: Three times a day (TID) | ORAL | 0 refills | Status: DC | PRN

## 2023-12-18 NOTE — Progress Notes (Signed)
 Pain Scale   Average Pain 3 Patient advising he has neck pain radiating to his left shoulder, Patient advising his pain increases at night         +Driver, -BT, -Dye Allergies.

## 2023-12-18 NOTE — Progress Notes (Signed)
 Shawn Ball. - 63 y.o. male MRN [id number]  Date of birth: [birth year]  Office Visit Note: Visit Date: 12/18/2023 PCP: Levora Reyes JONELLE, MD Referred by: Levora Reyes JONELLE, MD  Subjective: Chief Complaint  Patient presents with   Neck - Pain   HPI: Shawn Ball. is a 63 y.o. male who comes in today Per the request of Leonce Reveal, [place] for evaluation of chronic, worsening and severe bilateral neck pain radiating to left shoulder and down left arm. Pain ongoing for several years, worsened about 4 months ago when neighbor's child jumped on his back. No specific aggravating factors. He describes pain as sore and throbbing sensation, currently rates as 8 out of 10. Some relief of pain with home exercise regimen, rest and use of medications. He does take Flexeril and Ibuprofen  as needed. He has tried oral Prednisone  in the past with good relief of pain. No history of formal physical therapy. Recent cervical MRI imaging shows moderate to prominent central stenosis at C5-C6 due to right paracentral disc protrusion and facet arthropathy.  Moderate central stenosis and moderate right foraminal stenosis at C6-C7. There is moderate to prominent left foraminal stenosis at C4-C5. No history of cervical surgery/injections.   History of left carpal tunnel release with Dr. Murrell in 2021. Patients course is complicated by diabetes mellitus, A1C of 6.9 on 10/13/2023.      Review of Systems  Musculoskeletal:  Positive for neck pain.  Neurological:  Negative for tingling, sensory change, focal weakness and weakness.  All other systems reviewed and are negative.  Otherwise per HPI.  Assessment & Plan: Visit Diagnoses:    ICD-10-CM   1. Radiculopathy, cervical region  M54.12 Ambulatory referral to Physical Medicine Rehab    2. Cervical disc disorder with radiculopathy, unspecified cervical region  M50.10 Ambulatory referral to Physical Medicine Rehab    3. Spinal stenosis of cervical  region  M48.02 Ambulatory referral to Physical Medicine Rehab       Plan: Findings:  Chronic, worsening and severe bilateral neck pain radiating to left shoulder and down left arm.  Patient continues to have severe pain despite good conservative therapies such as home exercise regimen, rest and use of medications.  Patient's clinical presentation and exam are consistent with cervical radiculopathy.  I discussed recent cervical MRI with him today using imaging and spine model.  There is significant spinal canal stenosis at C5-C6 due to right paracentral disc herniation.  There is also moderate central stenosis at C6-C7.  Also multilevel facet arthropathy and foraminal stenosis that could be a source of pain.  We discussed treatment plan in detail today.  Next step is to perform diagnostic and hopefully therapeutic left C7-T1 interlaminar epidural steroid injection under fluoroscopic guidance.  He is not currently taking anticoagulant medication.  I discussed injection procedure in detail today, he has no questions at this time.  If good relief of pain with injection we can repeat this procedure infrequently as needed.  His exam today is nonfocal, good strength noted to bilateral upper extremities, no myelopathic symptoms noted.  Patient did inquire about consult with surgeon, we are happy to set this up for him at any point.  He would like to go ahead and proceed with injection at this time.  I did refill Flexeril for him today, he can continue with ibuprofen  as needed.    Meds & Orders:  Meds ordered this encounter  Medications   cyclobenzaprine (FLEXERIL) 10 MG tablet  Sig: Take 1 tablet (10 mg total) by mouth 3 (three) times daily as needed for muscle spasms.    Dispense:  60 tablet    Refill:  0    Orders Placed This Encounter  Procedures   Ambulatory referral to Physical Medicine Rehab    Follow-up: Return for Left C7-T1 interlaminar epidural steroid injection.   Procedures: No procedures  performed      Clinical History: Narrative & Impression EXAM: MRI CERVICAL SPINE WITHOUT CONTRAST 12/05/2023 09:56:59 AM   TECHNIQUE: Multiplanar multisequence MRI of the cervical spine was performed.   COMPARISON: None available.   CLINICAL HISTORY: Cervicalgia, radiculopathy.   FINDINGS:   BONES AND ALIGNMENT: Congenitally short pedicles in the cervical spine. 1.5 mm degenerative retrolisthesis at C3-4.   SPINAL CORD: No abnormal spinal cord signal.   SOFT TISSUES: No paraspinal mass.   C2-3: No impingement. Substantial degenerative left facet arthropathy with trace left facet joint effusion.   C3-4: Moderate bilateral foraminal stenosis and borderline central stenosis due to disc osteophyte complex and facet arthropathy. Loss of intervertebral disc height. Disc desiccation.   C4-5: Moderate to prominent left foraminal stenosis due to extensive degenerative left facet arthropathy and left uncinate spurring. Faintly accentuated T2 signal in the intervertebral disc, without endplate irregularity or marrow edema to suggest discitis or osteomyelitis.   C5-6: Moderate to prominent central stenosis and borderline left foraminal stenosis due to right paracentral disc protrusion, left greater than right facet arthropathy, and uncinate spurring. Loss of intervertebral disc height. Disc desiccation.   C6-7: Moderate right and borderline left foraminal stenosis and moderate central stenosis due to disc osteophyte complex, uncinate spurring, and short pedicles. Loss of intervertebral disc height. Disc desiccation.   C7-T1: No impingement. Small central disc protrusion. Disc desiccation.   IMPRESSION: 1. Moderate to prominent central stenosis at C5-C6 due to right paracentral disc protrusion, facet arthropathy, and uncinate spurring. 2. Moderate to prominent left foraminal stenosis at C4-C5 due to degenerative left facet arthropathy and left uncinate spurring. 3.  Moderate central stenosis and moderate right foraminal stenosis at C6-C7 due to disc osteophyte complex, uncinate spurring, and short pedicles. 4. Moderate bilateral foraminal stenosis at C3-C4 due to disc osteophyte complex and facet arthropathy. 5. Degenerative findings including multilevel disc desiccation and loss of disc height, most notable at C3-C4 and C6-C7. 6. Congenitally short cervical pedicles.   Electronically signed by: Ryan Salvage MD 12/09/2023 04:16 PM EDT RP Workstation: [id number]   He reports that he has never smoked. He has quit using smokeless tobacco.  His smokeless tobacco use included chew.  Recent Labs    05/22/23 0846 10/13/23 1214  HGBA1C 6.6* 6.9*    Objective:  VS:  HT:    WT:   BMI:     BP:   HR: bpm  TEMP: ( )  RESP:  Physical Exam Vitals and nursing note reviewed.  HENT:     Head: Normocephalic and atraumatic.     Right Ear: External ear normal.     Left Ear: External ear normal.     Nose: Nose normal.     Mouth/Throat:     Mouth: Mucous membranes are moist.  Eyes:     Extraocular Movements: Extraocular movements intact.  Cardiovascular:     Rate and Rhythm: Normal rate.     Pulses: Normal pulses.  Pulmonary:     Effort: Pulmonary effort is normal.  Abdominal:     General: Abdomen is flat. There is no distension.  Musculoskeletal:  General: Tenderness present.     Cervical back: Tenderness present.     Comments: Mild discomfort noted flexion, extension and side-to-side rotation. Patient has good strength in the upper extremities including 5 out of 5 strength in wrist extension, long finger flexion and APB. Shoulder range of motion is full bilaterally without any sign of impingement. There is no atrophy of the hands intrinsically. Sensation intact bilaterally. Negative Hoffman's sign. Positive Spurling's sign on the left.     Skin:    General: Skin is warm and dry.     Capillary Refill: Capillary refill takes less than 2  seconds.  Neurological:     General: No focal deficit present.     Mental Status: He is alert and oriented to person, place, and time.  Psychiatric:        Mood and Affect: Mood normal.        Behavior: Behavior normal.     Ortho Exam  Imaging: No results found.  Past Medical/Family/Surgical/Social History: Medications & Allergies reviewed per EMR, new medications updated. Patient Active Problem List   Diagnosis Date Noted   Constipation 09/25/2023   Acute viral conjunctivitis of right eye 08/28/2020   Routine general medical examination at a health care facility 05/01/2020   Hyperlipidemia LDL goal <70 03/15/2020   Hypertriglyceridemia 03/15/2020   Diabetes mellitus without complication (HCC)    Benign essential HTN 08/25/2019   Past Medical History:  Diagnosis Date   Allergy    Arthritis    Cataract    Chronic bronchitis    Chronic pain    per pt/ right shoulder pain, left leg and ankle   CKD (chronic kidney disease) stage 3, GFR 30-59 ml/min (HCC)    Costochondral chest pain    due to torn tendons.   CTS (carpal tunnel syndrome)    right hand   Diabetes mellitus without complication (HCC)    prediabetes/ no meds   Diverticulitis    GERD (gastroesophageal reflux disease)    History of anal fissures    Hyperlipemia    Hypertension    Osteoporosis    Rectal bleeding    occasional   Seasonal allergies    Family History  Problem Relation Age of Onset   Heart disease Mother    Hyperlipidemia Mother    Diabetes Mother    Macular degeneration Mother    Cancer Father        skin   Macular degeneration Sister    Diabetes Brother    Colon cancer Neg Hx    Stomach cancer Neg Hx    Esophageal cancer Neg Hx    Rectal cancer Neg Hx    Past Surgical History:  Procedure Laterality Date   ANKLE FRACTURE SURGERY  2005 / 2007   left ankle and  left leg/ have screws in leg and ankle   CARPAL TUNNEL RELEASE Left 07/29/2019   Procedure: CARPAL TUNNEL RELEASE;   Surgeon: Murrell Kuba, MD;  Location: [facility];  Service: Orthopedics;  Laterality: Left;  FOREARM BLOCK   EXAMINATION UNDER ANESTHESIA N/A 12/15/2012   Procedure: EXAM UNDER ANESTHESIA;  Surgeon: Camellia CHRISTELLA Blush, MD;  Location: [facility];  Service: General;  Laterality: N/A;   FRACTURE SURGERY  2005 and 2007   lt ankle x2   HEMORRHOIDECTOMY WITH HEMORRHOID BANDING N/A 12/15/2012   Procedure: EXCISIONAL HEMORRHOIDECTOMY WITH HEMORRHOID BANDING;  Surgeon: Camellia CHRISTELLA Blush, MD;  Location: [facility];  Service: General;  Laterality: N/A;  rotator cuff surgery      x 2/ right shoulder   TRIGGER FINGER RELEASE  07/23/2011   Procedure: Right hand- RELEASE TRIGGER FINGER/A-1 PULLEY;  Surgeon: Lamar LULLA Leonor Ball., MD;  Location: [facility];  Service: Orthopedics;  Laterality: Right;   WRIST FRACTURE SURGERY  1984   lt with bone graft   WRIST SURGERY     left wrist   Social History   Occupational History   Occupation: disabled  Tobacco Use   Smoking status: Never   Smokeless tobacco: Former    Types: Chew   Tobacco comments:    Quit age 89 y.o  Vaping Use   Vaping status: Never Used  Substance and Sexual Activity   Alcohol use: No   Drug use: No   Sexual activity: Not Currently

## 2023-12-19 ENCOUNTER — Ambulatory Visit: Admitting: Family Medicine

## 2023-12-22 ENCOUNTER — Telehealth: Payer: Self-pay | Admitting: Physical Medicine and Rehabilitation

## 2023-12-22 ENCOUNTER — Encounter: Payer: Self-pay | Admitting: Physical Medicine and Rehabilitation

## 2023-12-22 NOTE — Telephone Encounter (Signed)
 Patient called and said can you prescribe him a muscle relaxer that will not raise his blood sugar . He said its staying over 200. CB#[phone]

## 2023-12-24 ENCOUNTER — Telehealth: Payer: Self-pay | Admitting: Physical Medicine and Rehabilitation

## 2023-12-24 NOTE — Telephone Encounter (Signed)
 Pt called wanting to make an appt for an injection neck. Pt call back number is [phone]

## 2023-12-26 ENCOUNTER — Ambulatory Visit: Admitting: Physical Medicine and Rehabilitation

## 2023-12-30 ENCOUNTER — Other Ambulatory Visit: Payer: Self-pay | Admitting: Family Medicine

## 2023-12-30 DIAGNOSIS — E781 Pure hyperglyceridemia: Secondary | ICD-10-CM

## 2023-12-30 NOTE — Telephone Encounter (Signed)
 Requested Prescriptions   Pending Prescriptions Disp Refills   omega-3 acid ethyl esters (LOVAZA ) 1 g capsule [Pharmacy Med Name: Omega-3-acid  Ethyl Esters 1 GM Oral Capsule] 120 capsule 0    Sig: Take 2 capsules by mouth twice daily     Date of patient request: 12/30/2023 Last office visit: 11/24/2023 Upcoming visit: Visit date not found Date of last refill: 12/01/2023 Last refill amount: 120

## 2024-01-05 ENCOUNTER — Encounter: Payer: Self-pay | Admitting: Radiology

## 2024-01-14 ENCOUNTER — Other Ambulatory Visit: Payer: Self-pay

## 2024-01-14 ENCOUNTER — Ambulatory Visit: Admitting: Physical Medicine and Rehabilitation

## 2024-01-14 VITALS — BP 144/88 | HR 87

## 2024-01-14 DIAGNOSIS — M5412 Radiculopathy, cervical region: Secondary | ICD-10-CM

## 2024-01-14 MED ORDER — METHYLPREDNISOLONE ACETATE 40 MG/ML IJ SUSP
40.0000 mg | Freq: Once | INTRAMUSCULAR | Status: AC
  Administered 2024-01-14: 40 mg

## 2024-01-14 NOTE — Progress Notes (Signed)
 Fairy JONELLE Theo Mickey. - 63 y.o. male MRN [id number]  Date of birth: August 04, 1960  Office Visit Note: Visit Date: 01/14/2024 PCP: Levora Reyes JONELLE, MD Referred by: Levora Reyes JONELLE, MD  Subjective: Chief Complaint  Patient presents with   Neck - Pain   HPI:  Zacchaeus Halm. is a 63 y.o. male who comes in today at the request of Duwaine Pouch, FNP for planned Left C7-T1 Cervical Interlaminar epidural steroid injection with fluoroscopic guidance.  The patient has failed conservative care including home exercise, medications, time and activity modification.  This injection will be diagnostic and hopefully therapeutic.  Please see requesting physician notes for further details and justification.   ROS Otherwise per HPI.  Assessment & Plan: Visit Diagnoses:    ICD-10-CM   1. Cervical radiculopathy  M54.12 XR C-ARM NO REPORT    Epidural Steroid injection    methylPREDNISolone acetate (DEPO-MEDROL) injection 40 mg      Plan: No additional findings.   Meds & Orders:  Meds ordered this encounter  Medications   methylPREDNISolone acetate (DEPO-MEDROL) injection 40 mg    Orders Placed This Encounter  Procedures   XR C-ARM NO REPORT   Epidural Steroid injection    Follow-up: Return for visit to requesting provider as needed.   Procedures: No procedures performed  Cervical Epidural Steroid Injection - Interlaminar Approach with Fluoroscopic Guidance  Patient: Cordarro Spinnato.      Date of Birth: March 28, 1960 MRN: [id number] PCP: Levora Reyes JONELLE, MD      Visit Date: 01/14/2024   Universal Protocol:    Date/Time: 11/12/251:01 PM  Consent Given By: the patient  Position: PRONE  Additional Comments: Vital signs were monitored before and after the procedure. Patient was prepped and draped in the usual sterile fashion. The correct patient, procedure, and site was verified.   Injection Procedure Details:   Procedure diagnoses: Cervical radiculopathy [M54.12]     Meds Administered:  Meds ordered this encounter  Medications   methylPREDNISolone acetate (DEPO-MEDROL) injection 40 mg     Laterality: Left  Location/Site: C7-T1  Needle: 3.5 in., 20 ga. Tuohy  Needle Placement: Paramedian epidural space  Findings:  -Comments: Excellent flow of contrast into the epidural space.  Procedure Details: Using a paramedian approach from the side mentioned above, the region overlying the inferior lamina was localized under fluoroscopic visualization and the soft tissues overlying this structure were infiltrated with 4 ml. of 1% Lidocaine  without Epinephrine . A # 20 gauge, Tuohy needle was inserted into the epidural space using a paramedian approach.  The epidural space was localized using loss of resistance along with contralateral oblique bi-planar fluoroscopic views.  After negative aspirate for air, blood, and CSF, a 2 ml. volume of Isovue-250 was injected into the epidural space and the flow of contrast was observed. Radiographs were obtained for documentation purposes.   The injectate was administered into the level noted above.  Additional Comments:  The patient tolerated the procedure well Dressing: 2 x 2 sterile gauze and Band-Aid    Post-procedure details: Patient was observed during the procedure. Post-procedure instructions were reviewed.  Patient left the clinic in stable condition.   Clinical History: Narrative & Impression EXAM: MRI CERVICAL SPINE WITHOUT CONTRAST 12/05/2023 09:56:59 AM   TECHNIQUE: Multiplanar multisequence MRI of the cervical spine was performed.   COMPARISON: None available.   CLINICAL HISTORY: Cervicalgia, radiculopathy.   FINDINGS:   BONES AND ALIGNMENT: Congenitally short pedicles in the cervical spine. 1.5 mm degenerative  retrolisthesis at C3-4.   SPINAL CORD: No abnormal spinal cord signal.   SOFT TISSUES: No paraspinal mass.   C2-3: No impingement. Substantial degenerative left facet  arthropathy with trace left facet joint effusion.   C3-4: Moderate bilateral foraminal stenosis and borderline central stenosis due to disc osteophyte complex and facet arthropathy. Loss of intervertebral disc height. Disc desiccation.   C4-5: Moderate to prominent left foraminal stenosis due to extensive degenerative left facet arthropathy and left uncinate spurring. Faintly accentuated T2 signal in the intervertebral disc, without endplate irregularity or marrow edema to suggest discitis or osteomyelitis.   C5-6: Moderate to prominent central stenosis and borderline left foraminal stenosis due to right paracentral disc protrusion, left greater than right facet arthropathy, and uncinate spurring. Loss of intervertebral disc height. Disc desiccation.   C6-7: Moderate right and borderline left foraminal stenosis and moderate central stenosis due to disc osteophyte complex, uncinate spurring, and short pedicles. Loss of intervertebral disc height. Disc desiccation.   C7-T1: No impingement. Small central disc protrusion. Disc desiccation.   IMPRESSION: 1. Moderate to prominent central stenosis at C5-C6 due to right paracentral disc protrusion, facet arthropathy, and uncinate spurring. 2. Moderate to prominent left foraminal stenosis at C4-C5 due to degenerative left facet arthropathy and left uncinate spurring. 3. Moderate central stenosis and moderate right foraminal stenosis at C6-C7 due to disc osteophyte complex, uncinate spurring, and short pedicles. 4. Moderate bilateral foraminal stenosis at C3-C4 due to disc osteophyte complex and facet arthropathy. 5. Degenerative findings including multilevel disc desiccation and loss of disc height, most notable at C3-C4 and C6-C7. 6. Congenitally short cervical pedicles.   Electronically signed by: Ryan Salvage MD 12/09/2023 04:16 PM EDT RP Workstation: [id number]     Objective:  VS:  HT:    WT:   BMI:     BP:(!) 144/88   HR:87bpm  TEMP: ( )  RESP:  Physical Exam Vitals and nursing note reviewed.  Constitutional:      General: He is not in acute distress.    Appearance: Normal appearance. He is not ill-appearing.  HENT:     Head: Normocephalic and atraumatic.     Right Ear: External ear normal.     Left Ear: External ear normal.  Eyes:     Extraocular Movements: Extraocular movements intact.  Cardiovascular:     Rate and Rhythm: Normal rate.     Pulses: Normal pulses.  Abdominal:     General: There is no distension.     Palpations: Abdomen is soft.  Musculoskeletal:        General: No signs of injury.     Cervical back: Neck supple. Tenderness present. No rigidity.     Right lower leg: No edema.     Left lower leg: No edema.     Comments: Patient has good strength in the upper extremities with 5 out of 5 strength in wrist extension long finger flexion APB.  No intrinsic hand muscle atrophy.  Negative Hoffmann's test.  Lymphadenopathy:     Cervical: No cervical adenopathy.  Skin:    Findings: No erythema or rash.  Neurological:     General: No focal deficit present.     Mental Status: He is alert and oriented to person, place, and time.     Sensory: No sensory deficit.     Motor: No weakness or abnormal muscle tone.     Coordination: Coordination normal.  Psychiatric:        Mood and Affect: Mood normal.  Behavior: Behavior normal.      Imaging: No results found.

## 2024-01-14 NOTE — Procedures (Signed)
 Cervical Epidural Steroid Injection - Interlaminar Approach with Fluoroscopic Guidance  Patient: Shawn Ball.      Date of Birth: 01-Sep-1960 MRN: [id number] PCP: Levora Reyes JONELLE, MD      Visit Date: 01/14/2024   Universal Protocol:    Date/Time: 11/12/251:01 PM  Consent Given By: the patient  Position: PRONE  Additional Comments: Vital signs were monitored before and after the procedure. Patient was prepped and draped in the usual sterile fashion. The correct patient, procedure, and site was verified.   Injection Procedure Details:   Procedure diagnoses: Cervical radiculopathy [M54.12]    Meds Administered:  Meds ordered this encounter  Medications   methylPREDNISolone acetate (DEPO-MEDROL) injection 40 mg     Laterality: Left  Location/Site: C7-T1  Needle: 3.5 in., 20 ga. Tuohy  Needle Placement: Paramedian epidural space  Findings:  -Comments: Excellent flow of contrast into the epidural space.  Procedure Details: Using a paramedian approach from the side mentioned above, the region overlying the inferior lamina was localized under fluoroscopic visualization and the soft tissues overlying this structure were infiltrated with 4 ml. of 1% Lidocaine  without Epinephrine . A # 20 gauge, Tuohy needle was inserted into the epidural space using a paramedian approach.  The epidural space was localized using loss of resistance along with contralateral oblique bi-planar fluoroscopic views.  After negative aspirate for air, blood, and CSF, a 2 ml. volume of Isovue-250 was injected into the epidural space and the flow of contrast was observed. Radiographs were obtained for documentation purposes.   The injectate was administered into the level noted above.  Additional Comments:  The patient tolerated the procedure well Dressing: 2 x 2 sterile gauze and Band-Aid    Post-procedure details: Patient was observed during the procedure. Post-procedure instructions were  reviewed.  Patient left the clinic in stable condition.

## 2024-01-14 NOTE — Progress Notes (Signed)
 Pain Scale   Average Pain 2 Patient advising he has chronic Neck  pain to left Arm. Patient states his pain is constant        +Driver, -BT, -Dye Allergies.

## 2024-01-15 ENCOUNTER — Encounter: Payer: Self-pay | Admitting: Family Medicine

## 2024-01-15 ENCOUNTER — Ambulatory Visit: Admitting: Family Medicine

## 2024-01-15 VITALS — BP 138/84 | HR 87 | Temp 98.3°F | Ht 71.0 in | Wt 214.6 lb

## 2024-01-15 DIAGNOSIS — I10 Essential (primary) hypertension: Secondary | ICD-10-CM

## 2024-01-15 DIAGNOSIS — E1122 Type 2 diabetes mellitus with diabetic chronic kidney disease: Secondary | ICD-10-CM | POA: Diagnosis not present

## 2024-01-15 DIAGNOSIS — N182 Chronic kidney disease, stage 2 (mild): Secondary | ICD-10-CM | POA: Diagnosis not present

## 2024-01-15 DIAGNOSIS — Z7984 Long term (current) use of oral hypoglycemic drugs: Secondary | ICD-10-CM | POA: Diagnosis not present

## 2024-01-15 LAB — COMPREHENSIVE METABOLIC PANEL WITH GFR
ALT: 37 U/L (ref 0–53)
AST: 21 U/L (ref 0–37)
Albumin: 4.2 g/dL (ref 3.5–5.2)
Alkaline Phosphatase: 65 U/L (ref 39–117)
BUN: 12 mg/dL (ref 6–23)
CO2: 24 meq/L (ref 19–32)
Calcium: 9.3 mg/dL (ref 8.4–10.5)
Chloride: 104 meq/L (ref 96–112)
Creatinine, Ser: 1.04 mg/dL (ref 0.40–1.50)
GFR: 77.24 mL/min (ref >60.00)
Glucose, Bld: 155 mg/dL — ABNORMAL HIGH (ref 70–99)
Potassium: 3.7 meq/L (ref 3.5–5.1)
Sodium: 138 meq/L (ref 135–145)
Total Bilirubin: 0.4 mg/dL (ref 0.2–1.2)
Total Protein: 6.8 g/dL (ref 6.0–8.3)

## 2024-01-15 LAB — HEMOGLOBIN A1C: Hgb A1c MFr Bld: 6.4 % (ref 4.6–6.5)

## 2024-01-15 MED ORDER — CARVEDILOL 12.5 MG PO TABS: 12.5000 mg | ORAL_TABLET | Freq: Two times a day (BID) | ORAL | 3 refills | Status: AC

## 2024-01-15 NOTE — Progress Notes (Signed)
 Subjective:  Patient ID: Shawn JONELLE Theo Mickey., male    DOB: [birth year]  Age: 62 y.o. MRN: [id number]  CC:  Chief Complaint  Patient presents with   Hypertension    Pt has had high blood pressure that last 6 months or so 142/94 recent     HPI Shawn Dieppa. presents for    Hypertension: As above, he has noticed some elevated readings the past 6 months or so.  He does have a history of diabetes with CKD.  Blood pressure borderline elevated at 138/88 on his September 22 visit, shoulder pain at that time.  Had also been taking ibuprofen  once per day.  He was taking carvedilol  6.25 mg twice daily, losartan  100 mg daily for hypertension without missed doses.  Continue same regimen as likely pain component with RTC precautions.  Also recommended cardiology follow-up at his September visit.  Has not seen cardiology.  Currently taking losartan  and carvedilol  at doses above. Had injection in neck for cervical radiculopathy yesterday. Pain is improving.  No added salt to food. Home readings: 140's/90 range.  BP Readings from Last 3 Encounters:  01/15/24 138/84  01/14/24 (!) 144/88  11/24/23 138/88   Lab Results  Component Value Date   CREATININE 1.00 10/13/2023    Diabetes: With history of hyperglycemia, microalbuminuria, CKD.  Treated with Tradjenta  5 mg daily, on statin and ARB. Creatinine improved with GFR 81 in August.  Previously 69. Home readings up to 200 when on flexeril, but down to 109 this morning, better readings last week.  Microalbumin: Ratio of 63 in March, on ARB.   Optho, foot exam, pneumovax: utd.   Lab Results  Component Value Date   HGBA1C 6.9 (H) 10/13/2023   HGBA1C 6.6 (H) 05/22/2023   HGBA1C 6.4 11/20/2022   Lab Results  Component Value Date   MICROALBUR 9.5 (H) 05/22/2023   LDLCALC 101 (H) 10/13/2023   CREATININE 1.00 10/13/2023     History Patient Active Problem List   Diagnosis Date Noted   Constipation 09/25/2023   Acute viral  conjunctivitis of right eye 08/28/2020   Routine general medical examination at a health care facility 05/01/2020   Hyperlipidemia LDL goal <70 03/15/2020   Hypertriglyceridemia 03/15/2020   Diabetes mellitus without complication (HCC)    Benign essential HTN 08/25/2019   Past Medical History:  Diagnosis Date   Allergy    Arthritis    Cataract    Chronic bronchitis    Chronic pain    per pt/ right shoulder pain, left leg and ankle   CKD (chronic kidney disease) stage 3, GFR 30-59 ml/min (HCC)    Costochondral chest pain    due to torn tendons.   CTS (carpal tunnel syndrome)    right hand   Diabetes mellitus without complication (HCC)    prediabetes/ no meds   Diverticulitis    GERD (gastroesophageal reflux disease)    History of anal fissures    Hyperlipemia    Hypertension    Osteoporosis    Rectal bleeding    occasional   Seasonal allergies    Past Surgical History:  Procedure Laterality Date   ANKLE FRACTURE SURGERY  2005 / 2007   left ankle and  left leg/ have screws in leg and ankle   CARPAL TUNNEL RELEASE Left 07/29/2019   Procedure: CARPAL TUNNEL RELEASE;  Surgeon: Murrell Kuba, MD;  Location: [facility];  Service: Orthopedics;  Laterality: Left;  FOREARM BLOCK   EXAMINATION UNDER  ANESTHESIA N/A 12/15/2012   Procedure: EXAM UNDER ANESTHESIA;  Surgeon: Camellia CHRISTELLA Blush, MD;  Location: [facility];  Service: General;  Laterality: N/A;   FRACTURE SURGERY  2005 and 2007   lt ankle x2   HEMORRHOIDECTOMY WITH HEMORRHOID BANDING N/A 12/15/2012   Procedure: EXCISIONAL HEMORRHOIDECTOMY WITH HEMORRHOID BANDING;  Surgeon: Camellia CHRISTELLA Blush, MD;  Location: [facility];  Service: General;  Laterality: N/A;   rotator cuff surgery      x 2/ right shoulder   TRIGGER FINGER RELEASE  07/23/2011   Procedure: Right hand- RELEASE TRIGGER FINGER/A-1 PULLEY;  Surgeon: Lamar LULLA Leonor Mickey., MD;  Location: [facility];  Service: Orthopedics;   Laterality: Right;   WRIST FRACTURE SURGERY  1984   lt with bone graft   WRIST SURGERY     left wrist   Allergies  Allergen Reactions   Amlodipine Other (See Comments)    Per pt - damage to kidney's   Codeine Nausea And Vomiting   Sulfa Antibiotics Nausea Only    nauseated   Prior to Admission medications   Medication Sig Start Date End Date Taking? Authorizing Provider  acetaminophen  (TYLENOL ) 500 MG tablet Take 500 mg by mouth every 6 (six) hours as needed for mild pain (pain score 1-3) or moderate pain (pain score 4-6).   Yes [provider]  ASPIRIN 81 PO Take 1 tablet by mouth daily.   Yes [provider]  Biotin 5000 MCG CAPS 2 times a week 12/03/14  Yes [provider]  carvedilol  (COREG ) 6.25 MG tablet Take 1 tablet (6.25 mg total) by mouth 2 (two) times daily with a meal. 05/22/23  Yes Levora Reyes SAUNDERS, MD  cetirizine  (ZYRTEC ) 10 MG tablet Take 1 tablet (10 mg total) by mouth daily. 10/28/19  Yes Duanne Butler DASEN, MD  Collagen Hydrolysate, Bovine, POWD by Does not apply route. Takes [facility] for arthritis   Yes [provider]  FIBER COMPLETE PO Take by mouth. Vita Fusion fiber gummies   Yes [provider]  Flavoring Agent (MANGO FLAVOR) POWD by Does not apply route.   Yes [provider]  fluticasone  (FLONASE ) 50 MCG/ACT nasal spray Place 2 sprays into both nostrils daily. 04/27/18  Yes Duanne Butler DASEN, MD  Garlic Oil 1000 MG CAPS  10/02/20  Yes [provider]  Glucosamine-Chondroitin-MSM 500-200-150 MG TABS Take by mouth.   Yes [provider]  L-Lysine 1000 MG TABS Take by mouth.   Yes [provider]  Lancets (FREESTYLE) lancets Use as instructed 10/28/19  Yes Duanne Butler DASEN, MD  linagliptin  (TRADJENTA ) 5 MG TABS tablet Take 1 tablet (5 mg total) by mouth daily. 05/22/23  Yes Levora Reyes SAUNDERS, MD  losartan  (COZAAR ) 100 MG tablet Take 1 tablet (100 mg total) by mouth daily. 05/22/23  Yes Levora Reyes SAUNDERS, MD  MAGNESIUM OXIDE PO Take 375 mg by mouth daily.   Yes [provider]  Multiple Vitamins-Minerals (VISION FORMULA EYE HEALTH PO)  06/02/17  Yes [provider]  omega-3 acid ethyl esters (LOVAZA ) 1 g capsule Take 2 capsules by mouth twice daily 12/30/23  Yes Levora Reyes SAUNDERS, MD  omeprazole  (PRILOSEC) 20 MG capsule Take 1 capsule (20 mg total) by mouth 2 (two) times daily before a meal. 05/22/23  Yes Levora Reyes SAUNDERS, MD  [facility] VERIO test strip USE 1 STRIP TO CHECK GLUCOSE IN THE MORNING AND AT BEDTIME 12/11/23  Yes Levora Reyes SAUNDERS, MD  pravastatin  (PRAVACHOL ) 20 MG tablet Take 1 tablet by mouth once daily 11/04/23  Yes Levora Reyes SAUNDERS, MD  mometasone -formoterol (DULERA) 100-5 MCG/ACT AERO Inhale 2 puffs into the lungs 2 (two) times daily. Patient not taking: Reported on 01/15/2024    [provider]   Social History   Socioeconomic History   Marital status: Divorced    Spouse name: Not on file   Number of children: 1   Years of education: Not on file   Highest education level: Some college, no degree  Occupational History   Occupation: disabled  Tobacco Use   Smoking status: Never   Smokeless tobacco: Former    Types: Chew   Tobacco comments:    Quit age 46 y.o  Vaping Use   Vaping status: Never Used  Substance and Sexual Activity   Alcohol use: No   Drug use: No   Sexual activity: Not Currently  Other Topics Concern   Not on file  Social History Narrative   Not on file   Social Drivers of Health   Financial Resource Strain: Low Risk  (12/18/2023)   Overall Financial Resource Strain (CARDIA)    Difficulty of Paying Living Expenses: Not hard at all  Food Insecurity: No Food Insecurity (12/18/2023)   Hunger Vital Sign    Worried About Running Out of Food in the Last Year: Never true    Ran Out of Food in the Last Year: Never true  Transportation Needs: No Transportation Needs (12/18/2023)   PRAPARE - Scientist, Research (physical Sciences) (Medical): No    Lack of Transportation (Non-Medical): No  Physical Activity: Insufficiently Active (12/18/2023)   Exercise Vital Sign    Days of Exercise per Week: 1 day    Minutes of Exercise per Session: 30 min  Stress: No Stress Concern Present (12/18/2023)   [employer] of Occupational Health - Occupational Stress Questionnaire    Feeling of Stress: Not at all  Social Connections: Moderately Integrated (12/18/2023)   Social Connection and Isolation Panel    Frequency of Communication with Friends and Family: More than three times a week    Frequency of Social Gatherings with Friends and Family: More than three times a week    Attends Religious Services: More than 4 times per year    Active Member of [employer] or Organizations: Yes    Attends Engineer, Structural: More than 4 times per year    Marital Status: Divorced  Catering Manager Violence: Not on file    Review of Systems   Objective:   Vitals:   01/15/24 1136  BP: 138/84  Pulse: 87  Temp: 98.3 F (36.8 C)  TempSrc: Temporal  SpO2: 97%  Weight: 214 lb 9.6 oz (97.3 kg)  Height: 5' 11 (1.803 m)     Physical Exam Vitals reviewed.  Constitutional:      Appearance: He is well-developed.  HENT:     Head: Normocephalic and atraumatic.  Neck:     Vascular: No carotid bruit or JVD.  Cardiovascular:     Rate and Rhythm: Normal rate and regular rhythm.     Heart sounds: Normal heart sounds. No murmur heard. Pulmonary:     Effort: Pulmonary effort is normal.     Breath sounds: Normal breath sounds. No rales.  Musculoskeletal:     Right lower leg: No edema.     Left lower leg: No edema.  Skin:    General: Skin is warm and dry.  Neurological:  Mental Status: He is alert and oriented to person, place, and time.  Psychiatric:        Mood and Affect: Mood normal.        Assessment & Plan:  Shawn Zabriskie. is a 63 y.o. male . Type 2 diabetes mellitus with stage 2 chronic  kidney disease, without long-term current use of insulin (HCC) - Plan: Comprehensive metabolic panel with GFR, Hemoglobin A1c  -tolerating current med regimen.  Some elevated readings recently done normalized past week or so.  Check A1c and adjust plan accordingly.  Essential hypertension - Plan: Comprehensive metabolic panel with GFR  - Some persistent elevated readings at home, and has been elevated as pain improves as well.  Will try higher dose of carvedilol  with potential side effects and risk discussed, hypotension/orthostatic precautions given and if that occurs to return to 6.25 mg dose.  Continue losartan  same dose.  RTC precautions, 38-month follow-up.  Labs above.  Meds ordered this encounter  Medications   carvedilol  (COREG ) 12.5 MG tablet    Sig: Take 1 tablet (12.5 mg total) by mouth 2 (two) times daily with a meal.    Dispense:  60 tablet    Refill:  3   Patient Instructions  We can try to increase the carvedilol  to the 12.5 mg dose twice per day for now.  Watch for any lightheadedness, low heart rates or side effects at that dose and if that occurs return to your 6.25 mg dose.  Continue losartan  same dose.  See information below on managing high blood pressure.  No changes to meds for diabetes for now.   Thank you for coming in today. If there are any concerns on your bloodwork, I will let you know. Take care!  Managing Your Hypertension Hypertension, also called high blood pressure, is when the force of the blood pressing against the walls of the arteries is too strong. Arteries are blood vessels that carry blood from your heart throughout your body. Hypertension forces the heart to work harder to pump blood and may cause the arteries to become narrow or stiff. Understanding blood pressure readings A blood pressure reading includes a higher number over a lower number: The first, or top, number is called the systolic pressure. It is a measure of the pressure in your arteries as  your heart beats. The second, or bottom number, is called the diastolic pressure. It is a measure of the pressure in your arteries as the heart relaxes. For most people, a normal blood pressure is below 120/80. Your personal target blood pressure may vary depending on your medical conditions, your age, and other factors. Blood pressure is classified into four stages. Based on your blood pressure reading, your health care provider may use the following stages to determine what type of treatment you need, if any. Systolic pressure and diastolic pressure are measured in a unit called millimeters of mercury (mmHg). Normal Systolic pressure: below 120. Diastolic pressure: below 80. Elevated Systolic pressure: 120-129. Diastolic pressure: below 80. Hypertension stage 1 Systolic pressure: 130-139. Diastolic pressure: 80-89. Hypertension stage 2 Systolic pressure: 140 or above. Diastolic pressure: 90 or above. How can this condition affect me? Managing your hypertension is very important. Over time, hypertension can damage the arteries and decrease blood flow to parts of the body, including the brain, heart, and kidneys. Having untreated or uncontrolled hypertension can lead to: A heart attack. A stroke. A weakened blood vessel (aneurysm). Heart failure. Kidney damage. Eye damage. Memory and  concentration problems. Vascular dementia. What actions can I take to manage this condition? Hypertension can be managed by making lifestyle changes and possibly by taking medicines. Your health care provider will help you make a plan to bring your blood pressure within a normal range. You may be referred for counseling on a healthy diet and physical activity. Nutrition  Eat a diet that is high in fiber and potassium, and low in salt (sodium), added sugar, and fat. An example eating plan is called the DASH diet. DASH stands for Dietary Approaches to Stop Hypertension. To eat this way: Eat plenty of fresh  fruits and vegetables. Try to fill one-half of your plate at each meal with fruits and vegetables. Eat whole grains, such as whole-wheat pasta, brown rice, or whole-grain bread. Fill about one-fourth of your plate with whole grains. Eat low-fat dairy products. Avoid fatty cuts of meat, processed or cured meats, and poultry with skin. Fill about one-fourth of your plate with lean proteins such as fish, chicken without skin, beans, eggs, and tofu. Avoid pre-made and processed foods. These tend to be higher in sodium, added sugar, and fat. Reduce your daily sodium intake. Many people with hypertension should eat less than 1,500 mg of sodium a day. Lifestyle  Work with your health care provider to maintain a healthy body weight or to lose weight. Ask what an ideal weight is for you. Get at least 30 minutes of exercise that causes your heart to beat faster (aerobic exercise) most days of the week. Activities may include walking, swimming, or biking. Include exercise to strengthen your muscles (resistance exercise), such as weight lifting, as part of your weekly exercise routine. Try to do these types of exercises for 30 minutes at least 3 days a week. Do not use any products that contain nicotine or tobacco. These products include cigarettes, chewing tobacco, and vaping devices, such as e-cigarettes. If you need help quitting, ask your health care provider. Control any long-term (chronic) conditions you have, such as high cholesterol or diabetes. Identify your sources of stress and find ways to manage stress. This may include meditation, deep breathing, or making time for fun activities. Alcohol use Do not drink alcohol if: Your health care provider tells you not to drink. You are pregnant, may be pregnant, or are planning to become pregnant. If you drink alcohol: Limit how much you have to: 0-1 drink a day for women. 0-2 drinks a day for men. Know how much alcohol is in your drink. In the [place] one  drink equals one 12 oz bottle of beer (355 mL), one 5 oz glass of wine (148 mL), or one 1 oz glass of hard liquor (44 mL). Medicines Your health care provider may prescribe medicine if lifestyle changes are not enough to get your blood pressure under control and if: Your systolic blood pressure is 130 or higher. Your diastolic blood pressure is 80 or higher. Take medicines only as told by your health care provider. Follow the directions carefully. Blood pressure medicines must be taken as told by your health care provider. The medicine does not work as well when you skip doses. Skipping doses also puts you at risk for problems. Monitoring Before you monitor your blood pressure: Do not smoke, drink caffeinated beverages, or exercise within 30 minutes before taking a measurement. Use the bathroom and empty your bladder (urinate). Sit quietly for at least 5 minutes before taking measurements. Monitor your blood pressure at home as told by your health care  provider. To do this: Sit with your back straight and supported. Place your feet flat on the floor. Do not cross your legs. Support your arm on a flat surface, such as a table. Make sure your upper arm is at heart level. Each time you measure, take two or three readings one minute apart and record the results. You may also need to have your blood pressure checked regularly by your health care provider. General information Talk with your health care provider about your diet, exercise habits, and other lifestyle factors that may be contributing to hypertension. Review all the medicines you take with your health care provider because there may be side effects or interactions. Keep all follow-up visits. Your health care provider can help you create and adjust your plan for managing your high blood pressure. Where to find more information National Heart, Lung, and Blood Institute: popsteam.is American Heart Association: www.heart.org Contact  a health care provider if: You think you are having a reaction to medicines you have taken. You have repeated (recurrent) headaches. You feel dizzy. You have swelling in your ankles. You have trouble with your vision. Get help right away if: You develop a severe headache or confusion. You have unusual weakness or numbness, or you feel faint. You have severe pain in your chest or abdomen. You vomit repeatedly. You have trouble breathing. These symptoms may be an emergency. Get help right away. Call 911. Do not wait to see if the symptoms will go away. Do not drive yourself to the hospital. Summary Hypertension is when the force of blood pumping through your arteries is too strong. If this condition is not controlled, it may put you at risk for serious complications. Your personal target blood pressure may vary depending on your medical conditions, your age, and other factors. For most people, a normal blood pressure is less than 120/80. Hypertension is managed by lifestyle changes, medicines, or both. Lifestyle changes to help manage hypertension include losing weight, eating a healthy, low-sodium diet, exercising more, stopping smoking, and limiting alcohol. This information is not intended to replace advice given to you by your health care provider. Make sure you discuss any questions you have with your health care provider. Document Revised: 11/02/2020 Document Reviewed: 11/02/2020 Elsevier Patient Education  2024 Elsevier Inc.      Signed,   Reyes Pines, MD [facility], [facility] 01/15/24 12:41 PM

## 2024-01-15 NOTE — Patient Instructions (Signed)
 We can try to increase the carvedilol  to the 12.5 mg dose twice per day for now.  Watch for any lightheadedness, low heart rates or side effects at that dose and if that occurs return to your 6.25 mg dose.  Continue losartan  same dose.  See information below on managing high blood pressure.  No changes to meds for diabetes for now.   Thank you for coming in today. If there are any concerns on your bloodwork, I will let you know. Take care!  Managing Your Hypertension Hypertension, also called high blood pressure, is when the force of the blood pressing against the walls of the arteries is too strong. Arteries are blood vessels that carry blood from your heart throughout your body. Hypertension forces the heart to work harder to pump blood and may cause the arteries to become narrow or stiff. Understanding blood pressure readings A blood pressure reading includes a higher number over a lower number: The first, or top, number is called the systolic pressure. It is a measure of the pressure in your arteries as your heart beats. The second, or bottom number, is called the diastolic pressure. It is a measure of the pressure in your arteries as the heart relaxes. For most people, a normal blood pressure is below 120/80. Your personal target blood pressure may vary depending on your medical conditions, your age, and other factors. Blood pressure is classified into four stages. Based on your blood pressure reading, your health care provider may use the following stages to determine what type of treatment you need, if any. Systolic pressure and diastolic pressure are measured in a unit called millimeters of mercury (mmHg). Normal Systolic pressure: below 120. Diastolic pressure: below 80. Elevated Systolic pressure: 120-129. Diastolic pressure: below 80. Hypertension stage 1 Systolic pressure: 130-139. Diastolic pressure: 80-89. Hypertension stage 2 Systolic pressure: 140 or above. Diastolic pressure:  90 or above. How can this condition affect me? Managing your hypertension is very important. Over time, hypertension can damage the arteries and decrease blood flow to parts of the body, including the brain, heart, and kidneys. Having untreated or uncontrolled hypertension can lead to: A heart attack. A stroke. A weakened blood vessel (aneurysm). Heart failure. Kidney damage. Eye damage. Memory and concentration problems. Vascular dementia. What actions can I take to manage this condition? Hypertension can be managed by making lifestyle changes and possibly by taking medicines. Your health care provider will help you make a plan to bring your blood pressure within a normal range. You may be referred for counseling on a healthy diet and physical activity. Nutrition  Eat a diet that is high in fiber and potassium, and low in salt (sodium), added sugar, and fat. An example eating plan is called the DASH diet. DASH stands for Dietary Approaches to Stop Hypertension. To eat this way: Eat plenty of fresh fruits and vegetables. Try to fill one-half of your plate at each meal with fruits and vegetables. Eat whole grains, such as whole-wheat pasta, brown rice, or whole-grain bread. Fill about one-fourth of your plate with whole grains. Eat low-fat dairy products. Avoid fatty cuts of meat, processed or cured meats, and poultry with skin. Fill about one-fourth of your plate with lean proteins such as fish, chicken without skin, beans, eggs, and tofu. Avoid pre-made and processed foods. These tend to be higher in sodium, added sugar, and fat. Reduce your daily sodium intake. Many people with hypertension should eat less than 1,500 mg of sodium a day. Lifestyle  Work with your health care provider to maintain a healthy body weight or to lose weight. Ask what an ideal weight is for you. Get at least 30 minutes of exercise that causes your heart to beat faster (aerobic exercise) most days of the week.  Activities may include walking, swimming, or biking. Include exercise to strengthen your muscles (resistance exercise), such as weight lifting, as part of your weekly exercise routine. Try to do these types of exercises for 30 minutes at least 3 days a week. Do not use any products that contain nicotine or tobacco. These products include cigarettes, chewing tobacco, and vaping devices, such as e-cigarettes. If you need help quitting, ask your health care provider. Control any long-term (chronic) conditions you have, such as high cholesterol or diabetes. Identify your sources of stress and find ways to manage stress. This may include meditation, deep breathing, or making time for fun activities. Alcohol use Do not drink alcohol if: Your health care provider tells you not to drink. You are pregnant, may be pregnant, or are planning to become pregnant. If you drink alcohol: Limit how much you have to: 0-1 drink a day for women. 0-2 drinks a day for men. Know how much alcohol is in your drink. In the [place] one drink equals one 12 oz bottle of beer (355 mL), one 5 oz glass of wine (148 mL), or one 1 oz glass of hard liquor (44 mL). Medicines Your health care provider may prescribe medicine if lifestyle changes are not enough to get your blood pressure under control and if: Your systolic blood pressure is 130 or higher. Your diastolic blood pressure is 80 or higher. Take medicines only as told by your health care provider. Follow the directions carefully. Blood pressure medicines must be taken as told by your health care provider. The medicine does not work as well when you skip doses. Skipping doses also puts you at risk for problems. Monitoring Before you monitor your blood pressure: Do not smoke, drink caffeinated beverages, or exercise within 30 minutes before taking a measurement. Use the bathroom and empty your bladder (urinate). Sit quietly for at least 5 minutes before taking  measurements. Monitor your blood pressure at home as told by your health care provider. To do this: Sit with your back straight and supported. Place your feet flat on the floor. Do not cross your legs. Support your arm on a flat surface, such as a table. Make sure your upper arm is at heart level. Each time you measure, take two or three readings one minute apart and record the results. You may also need to have your blood pressure checked regularly by your health care provider. General information Talk with your health care provider about your diet, exercise habits, and other lifestyle factors that may be contributing to hypertension. Review all the medicines you take with your health care provider because there may be side effects or interactions. Keep all follow-up visits. Your health care provider can help you create and adjust your plan for managing your high blood pressure. Where to find more information National Heart, Lung, and Blood Institute: popsteam.is American Heart Association: www.heart.org Contact a health care provider if: You think you are having a reaction to medicines you have taken. You have repeated (recurrent) headaches. You feel dizzy. You have swelling in your ankles. You have trouble with your vision. Get help right away if: You develop a severe headache or confusion. You have unusual weakness or numbness, or you feel faint.  You have severe pain in your chest or abdomen. You vomit repeatedly. You have trouble breathing. These symptoms may be an emergency. Get help right away. Call 911. Do not wait to see if the symptoms will go away. Do not drive yourself to the hospital. Summary Hypertension is when the force of blood pumping through your arteries is too strong. If this condition is not controlled, it may put you at risk for serious complications. Your personal target blood pressure may vary depending on your medical conditions, your age, and other  factors. For most people, a normal blood pressure is less than 120/80. Hypertension is managed by lifestyle changes, medicines, or both. Lifestyle changes to help manage hypertension include losing weight, eating a healthy, low-sodium diet, exercising more, stopping smoking, and limiting alcohol. This information is not intended to replace advice given to you by your health care provider. Make sure you discuss any questions you have with your health care provider. Document Revised: 11/02/2020 Document Reviewed: 11/02/2020 Elsevier Patient Education  2024 [employer].

## 2024-01-17 ENCOUNTER — Ambulatory Visit: Payer: Self-pay | Admitting: Family Medicine

## 2024-01-30 ENCOUNTER — Other Ambulatory Visit: Payer: Self-pay | Admitting: Family Medicine

## 2024-01-30 DIAGNOSIS — E1122 Type 2 diabetes mellitus with diabetic chronic kidney disease: Secondary | ICD-10-CM

## 2024-02-03 ENCOUNTER — Other Ambulatory Visit: Payer: Self-pay | Admitting: Family Medicine

## 2024-02-03 ENCOUNTER — Telehealth: Payer: Self-pay

## 2024-02-03 DIAGNOSIS — E782 Mixed hyperlipidemia: Secondary | ICD-10-CM

## 2024-02-03 DIAGNOSIS — E1122 Type 2 diabetes mellitus with diabetic chronic kidney disease: Secondary | ICD-10-CM

## 2024-02-03 NOTE — Telephone Encounter (Signed)
Patient scheduled 03/29/23.

## 2024-02-03 NOTE — Telephone Encounter (Signed)
 Called patient to schedule 3 months (around 04/16/2024) for med check and labs. Left vm to return call. If patient calls back, please schedule

## 2024-02-05 ENCOUNTER — Encounter: Payer: Self-pay | Admitting: Family Medicine

## 2024-02-06 ENCOUNTER — Ambulatory Visit: Admitting: Family Medicine

## 2024-02-09 ENCOUNTER — Encounter: Payer: Self-pay | Admitting: Family Medicine

## 2024-02-09 ENCOUNTER — Ambulatory Visit: Admitting: Family Medicine

## 2024-02-09 VITALS — BP 120/80 | HR 85 | Temp 98.2°F | Resp 14 | Ht 71.0 in | Wt 214.0 lb

## 2024-02-09 DIAGNOSIS — I10 Essential (primary) hypertension: Secondary | ICD-10-CM

## 2024-02-09 NOTE — Patient Instructions (Signed)
 Thank you for coming in today.  Blood pressure does look better.  I do not think we need to make any changes for now but continue to monitor those readings and let me know in the next few weeks what your averages are.  See the handout on some of the typical foods that can have higher sodium, and can affect your blood pressure control.  Follow-up in 3 months and we can recheck levels and possible labs at that time.  Take care!

## 2024-02-09 NOTE — Progress Notes (Unsigned)
 Subjective:  Patient ID: Shawn Ball., male    DOB: 07-19-1960  Age: 63 y.o. MRN: [id number]  CC:  Chief Complaint  Patient presents with   Hypertension    BP is still running a little high. 130s/80s    HPI Shawn Ball. presents for   Hypertension: Last visit November 13.  Blood pressure 138/84 at that time.  Had noted some elevated readings.  Was taking carvedilol  6.25 mg twice daily, losartan  100 mg daily.  Home readings were in the 140/90 range.  Recommended increasing his carvedilol  to 12.5 mg twice daily, continued same dose losartan . Has been on higher dose for past month.   Home readings: 130/86 range on carvedilol  after a week and half.  No missed doses.  Some pain form herniated disc in neck at times. Treated with tylenol . Advil  2 at night.   Occasional added salt. Occasional fast food, takeout.   BP Readings from Last 3 Encounters:  02/09/24 120/80  01/15/24 138/84  01/14/24 (!) 144/88   Lab Results  Component Value Date   CREATININE 1.04 01/15/2024   History Patient Active Problem List   Diagnosis Date Noted   Constipation 09/25/2023   Secondary localized osteoarthrosis of ankle and foot 09/20/2022   Closed fracture of distal end of right radius 03/12/2022   Radial styloid tenosynovitis of right hand 03/12/2022   Right wrist pain 03/12/2022   Acute viral conjunctivitis of right eye 08/28/2020   Routine general medical examination at a health care facility 05/01/2020   Hyperlipidemia LDL goal <70 03/15/2020   Hypertriglyceridemia 03/15/2020   Diabetes mellitus without complication (HCC)    Traumatic arthritis of ankle 03/12/2020   Arthralgia of left ankle 03/08/2020   Benign essential HTN 08/25/2019   Past Medical History:  Diagnosis Date   Allergy    Arthritis    Cataract    Chronic bronchitis    Chronic pain    per pt/ right shoulder pain, left leg and ankle   CKD (chronic kidney disease) stage 3, GFR 30-59 ml/min (HCC)     Costochondral chest pain    due to torn tendons.   CTS (carpal tunnel syndrome)    right hand   Diabetes mellitus without complication (HCC)    prediabetes/ no meds   Diverticulitis    GERD (gastroesophageal reflux disease)    History of anal fissures    Hyperlipemia    Hypertension    Osteoporosis    Rectal bleeding    occasional   Seasonal allergies    Past Surgical History:  Procedure Laterality Date   ANKLE FRACTURE SURGERY  2005 / 2007   left ankle and  left leg/ have screws in leg and ankle   CARPAL TUNNEL RELEASE Left 07/29/2019   Procedure: CARPAL TUNNEL RELEASE;  Surgeon: Shawn Kuba, MD;  Location: [facility];  Service: Orthopedics;  Laterality: Left;  FOREARM BLOCK   EXAMINATION UNDER ANESTHESIA N/A 12/15/2012   Procedure: EXAM UNDER ANESTHESIA;  Surgeon: Shawn CHRISTELLA Blush, MD;  Location: [facility];  Service: General;  Laterality: N/A;   FRACTURE SURGERY  2005 and 2007   lt ankle x2   HEMORRHOIDECTOMY WITH HEMORRHOID BANDING N/A 12/15/2012   Procedure: EXCISIONAL HEMORRHOIDECTOMY WITH HEMORRHOID BANDING;  Surgeon: Shawn CHRISTELLA Blush, MD;  Location: [facility];  Service: General;  Laterality: N/A;   HERNIA REPAIR  1996   rotator cuff surgery      x 2/ right shoulder  TRIGGER FINGER RELEASE  07/23/2011   Procedure: Right hand- RELEASE TRIGGER FINGER/A-1 PULLEY;  Surgeon: Shawn LULLA Leonor Ball., MD;  Location: [facility];  Service: Orthopedics;  Laterality: Right;   WRIST FRACTURE SURGERY  1984   lt with bone graft   WRIST SURGERY     left wrist   Allergies  Allergen Reactions   Amlodipine Other (See Comments)    Per pt - damage to kidney's   Codeine Nausea And Vomiting   Sulfa Antibiotics Nausea Only    nauseated   Prior to Admission medications   Medication Sig Start Date End Date Taking? Authorizing Provider  acetaminophen  (TYLENOL ) 500 MG tablet Take 500 mg by mouth every 6 (six) hours as needed for mild  pain (pain score 1-3) or moderate pain (pain score 4-6).   Yes [provider]  ASPIRIN 81 PO Take 1 tablet by mouth daily.   Yes [provider]  Biotin 5000 MCG CAPS 2 times a week 12/03/14  Yes [provider]  carvedilol  (COREG ) 12.5 MG tablet Take 1 tablet (12.5 mg total) by mouth 2 (two) times daily with a meal. 01/15/24  Yes Shawn Reyes SAUNDERS, MD  cetirizine  (ZYRTEC ) 10 MG tablet Take 1 tablet (10 mg total) by mouth daily. 10/28/19  Yes Shawn Butler DASEN, MD  Collagen Hydrolysate, Bovine, POWD by Does not apply route. Takes [facility] for arthritis   Yes [provider]  FIBER COMPLETE PO Take by mouth. Vita Fusion fiber gummies   Yes [provider]  Flavoring Agent (MANGO FLAVOR) POWD by Does not apply route.   Yes [provider]  fluticasone  (FLONASE ) 50 MCG/ACT nasal spray Place 2 sprays into both nostrils daily. 04/27/18  Yes Shawn Butler DASEN, MD  Garlic Oil 1000 MG CAPS  10/02/20  Yes [provider]  Glucosamine-Chondroitin-MSM 500-200-150 MG TABS Take by mouth.   Yes [provider]  L-Lysine 1000 MG TABS Take by mouth.   Yes [provider]  Lancets (FREESTYLE) lancets Use as instructed 10/28/19  Yes Shawn Butler DASEN, MD  linagliptin  (TRADJENTA ) 5 MG TABS tablet Take 1 tablet (5 mg total) by mouth daily. 05/22/23  Yes Shawn Reyes SAUNDERS, MD  losartan  (COZAAR ) 100 MG tablet Take 1 tablet (100 mg total) by mouth daily. 05/22/23  Yes Shawn Reyes SAUNDERS, MD  MAGNESIUM OXIDE PO Take 375 mg by mouth daily.   Yes [provider]  Multiple Vitamins-Minerals (VISION FORMULA EYE HEALTH PO)  06/02/17  Yes [provider]  omega-3 acid ethyl esters (LOVAZA ) 1 g capsule Take 2 capsules by mouth twice daily 12/30/23  Yes Shawn Reyes SAUNDERS, MD  [facility] VERIO test strip USE 1 STRIP TO CHECK GLUCOSE IN THE MORNING AND AT BEDTIME 02/03/24  Yes Shawn Reyes SAUNDERS, MD  pravastatin  (PRAVACHOL ) 20 MG tablet Take 1  tablet by mouth once daily 02/03/24  Yes Shawn Reyes SAUNDERS, MD  mometasone -formoterol (DULERA) 100-5 MCG/ACT AERO Inhale 2 puffs into the lungs 2 (two) times daily. Patient not taking: Reported on 02/09/2024    [provider]  omeprazole  (PRILOSEC) 20 MG capsule Take 1 capsule (20 mg total) by mouth 2 (two) times daily before a meal. 05/22/23   Shawn Reyes SAUNDERS, MD   Social History   Socioeconomic History   Marital status: Divorced    Spouse name: Not on file   Number of children: 1   Years of education: Not on file   Highest education level: Some college, no  degree  Occupational History   Occupation: disabled  Tobacco Use   Smoking status: Never   Smokeless tobacco: Former    Types: Chew   Tobacco comments:    Quit age 38 y.o  Vaping Use   Vaping status: Never Used  Substance and Sexual Activity   Alcohol use: No   Drug use: No   Sexual activity: Not Currently  Other Topics Concern   Not on file  Social History Narrative   Not on file   Social Drivers of Health   Financial Resource Strain: Low Risk  (12/18/2023)   Overall Financial Resource Strain (CARDIA)    Difficulty of Paying Living Expenses: Not hard at all  Food Insecurity: No Food Insecurity (12/18/2023)   Hunger Vital Sign    Worried About Running Out of Food in the Last Year: Never true    Ran Out of Food in the Last Year: Never true  Transportation Needs: No Transportation Needs (12/18/2023)   PRAPARE - Administrator, Civil Service (Medical): No    Lack of Transportation (Non-Medical): No  Physical Activity: Insufficiently Active (12/18/2023)   Exercise Vital Sign    Days of Exercise per Week: 1 day    Minutes of Exercise per Session: 30 min  Stress: No Stress Concern Present (12/18/2023)   [employer] of Occupational Health - Occupational Stress Questionnaire    Feeling of Stress: Not at all  Social Connections: Moderately Integrated (12/18/2023)   Social Connection and  Isolation Panel    Frequency of Communication with Friends and Family: More than three times a week    Frequency of Social Gatherings with Friends and Family: More than three times a week    Attends Religious Services: More than 4 times per year    Active Member of [employer] or Organizations: Yes    Attends Engineer, Structural: More than 4 times per year    Marital Status: Divorced  Catering Manager Violence: Not on file    Review of Systems Per hpi.   Objective:   Vitals:   02/09/24 1549  BP: 120/80  Pulse: 85  Resp: 14  Temp: 98.2 F (36.8 C)  TempSrc: Temporal  SpO2: 97%  Weight: 214 lb (97.1 kg)  Height: 5' 11 (1.803 m)    Physical Exam Vitals reviewed.  Constitutional:      Appearance: He is well-developed.  HENT:     Head: Normocephalic and atraumatic.  Neck:     Vascular: No carotid bruit or JVD.  Cardiovascular:     Rate and Rhythm: Normal rate and regular rhythm.     Heart sounds: Normal heart sounds. No murmur heard. Pulmonary:     Effort: Pulmonary effort is normal.     Breath sounds: Normal breath sounds. No rales.  Musculoskeletal:     Right lower leg: No edema.     Left lower leg: No edema.  Skin:    General: Skin is warm and dry.  Neurological:     Mental Status: He is alert and oriented to person, place, and time.  Psychiatric:        Mood and Affect: Mood normal.     Assessment & Plan:  Eragon Hammond. is a 63 y.o. male . Essential hypertension Improved, borderline control at current levels in office but that has been an improvement.  On discussing diet, potentially may have some higher sodium foods that may be contributing, and based on his current levels that could be  a reasonable start instead of additional medication.  Continue to monitor, recheck in 3 months along with labs at that time.  He will advise me on his home readings in next few weeks and certainly could make some additional changes if persistent elevations.  RTC  precautions given, all questions answered.  No orders of the defined types were placed in this encounter.  Patient Instructions  Thank you for coming in today.  Blood pressure does look better.  I do not think we need to make any changes for now but continue to monitor those readings and let me know in the next few weeks what your averages are.  See the handout on some of the typical foods that can have higher sodium, and can affect your blood pressure control.  Follow-up in 3 months and we can recheck levels and possible labs at that time.  Take care!    Signed,   Reyes Pines, MD [facility], [facility] 02/09/24 4:28 PM

## 2024-02-12 ENCOUNTER — Encounter: Payer: Self-pay | Admitting: Family Medicine

## 2024-02-12 DIAGNOSIS — E1122 Type 2 diabetes mellitus with diabetic chronic kidney disease: Secondary | ICD-10-CM

## 2024-02-12 NOTE — Telephone Encounter (Signed)
 Patient is requesting Contour Next EZ meter and strips due to insurance changes for the new year.

## 2024-02-13 MED ORDER — BLOOD GLUCOSE TEST VI STRP: 1.0000 | ORAL_STRIP | 0 refills | Status: AC

## 2024-02-13 MED ORDER — BLOOD GLUCOSE MONITORING SUPPL DEVI: 1.0000 | 0 refills | Status: AC

## 2024-02-13 MED ORDER — LANCET DEVICE MISC: 1.0000 | 0 refills | Status: AC

## 2024-02-13 MED ORDER — LANCETS MISC: 1.0000 | 0 refills | Status: AC

## 2024-02-17 ENCOUNTER — Other Ambulatory Visit (HOSPITAL_COMMUNITY): Payer: Self-pay

## 2024-02-18 ENCOUNTER — Other Ambulatory Visit (HOSPITAL_COMMUNITY): Payer: Self-pay

## 2024-02-29 ENCOUNTER — Encounter: Payer: Self-pay | Admitting: Family Medicine

## 2024-03-01 NOTE — Telephone Encounter (Signed)
 Patient is concerned that he is having high readings still. He is wondering if we should increase his medication.

## 2024-03-01 NOTE — Telephone Encounter (Signed)
 Tried to call patient. Unable to leave a vm to return call   Copied from CRM [id number]. Topic: General - Other >> Mar 01, 2024  3:40 PM Alfonso HERO wrote: Reason for CRM: Patient calling back in regards to the [place] message he sent.. Very concerned asking for someone to follow up with him.

## 2024-03-04 ENCOUNTER — Other Ambulatory Visit: Payer: Self-pay | Admitting: Family Medicine

## 2024-03-04 DIAGNOSIS — E1122 Type 2 diabetes mellitus with diabetic chronic kidney disease: Secondary | ICD-10-CM

## 2024-03-08 ENCOUNTER — Ambulatory Visit: Payer: Self-pay

## 2024-03-08 NOTE — Telephone Encounter (Signed)
 Noted.  If any new or worsening symptoms prior to that visit, should be seen by any provider if I do not have an opening.

## 2024-03-08 NOTE — Telephone Encounter (Signed)
 FYI Only or Action Required?: FYI only for provider: appointment scheduled on 03/10/24.  Patient was last seen in primary care on 02/09/2024 by Levora Reyes SAUNDERS, MD.  Called Nurse Triage reporting Hypertension.  Symptoms began several weeks ago.  Interventions attempted: Prescription medications: Carvedilol  and Rest, hydration, or home remedies.  Symptoms are: unchanged.  Triage Disposition: See PCP When Office is Open (Within 3 Days)  Patient/caregiver understands and will follow disposition?: Yes   Reason for Disposition  Systolic BP >= 160 OR Diastolic >= 100  Answer Assessment - Initial Assessment Questions Carvedilol  6.25mg  daily to bid. Then on 03/02/25 he messaged provider for increased blood pressures and Carvedilol  was increased to 12.5 mg BID. He is concerned that his pressures are not going back to baseline. He does have some fatigue and intermittent headache.  Feels headache and pressure are from herniated disc.  Herniated disc at cervical spine-[facility]. Injection in November C6 or C7 he is unsure. He is looking for treatment other than injections.   1. BLOOD PRESSURE: What is your blood pressure? Did you take at least two measurements 5 minutes apart?   01/01 134/91, 01/02 150/97 147/90 01/03 150/91 144/87, 01/05  147/102, 69.  Pulse raging in the 70's 2. ONSET: When did you take your blood pressure?     Daily 3. HOW: How did you take your blood pressure? (e.g., automatic home BP monitor, visiting nurse)     home 4. HISTORY: Do you have a history of high blood pressure?     yes 5. MEDICINES: Are you taking any medicines for blood pressure? Have you missed any doses recently?     Carvedilol  6. OTHER SYMPTOMS: Do you have any symptoms? (e.g., blurred vision, chest pain, difficulty breathing, headache, weakness)     Back pain for 9 month from herniated disc, intermittent hand numbness upon waking up in the morning. Anxiety. Intermittent mild  headaches.  Protocols used: Blood Pressure - High-A-AH  Copied from CRM [id number]. Topic: Clinical - Red Word Triage >> Mar 08, 2024  8:38 AM Thersia BROCKS wrote: Red Word that prompted transfer to Nurse Triage: Patient called in regarding prescription stated Carvedilol  to 12.5mg  twice each day increased to 25mg  , stated the blood pressure is still not going down, stated its still increasing, stated pulse is staying steady but   01/01 134/91  01/02 150/97  147/90 01/03  150/91  144/87 01/05  147/102   stated it has been increasing, is having headache in his left temple, and is very weak and tired not feeling well   Wants to know if he needs to come back in or if a medication needs to be changed , would like a callback on what he needs

## 2024-03-11 ENCOUNTER — Ambulatory Visit: Admitting: Family Medicine

## 2024-03-11 ENCOUNTER — Encounter: Payer: Self-pay | Admitting: Family Medicine

## 2024-03-11 VITALS — BP 126/84 | HR 82 | Temp 98.0°F | Resp 16 | Ht 71.0 in | Wt 217.0 lb

## 2024-03-11 DIAGNOSIS — I10 Essential (primary) hypertension: Secondary | ICD-10-CM | POA: Diagnosis not present

## 2024-03-11 DIAGNOSIS — R519 Headache, unspecified: Secondary | ICD-10-CM | POA: Diagnosis not present

## 2024-03-11 DIAGNOSIS — H538 Other visual disturbances: Secondary | ICD-10-CM | POA: Diagnosis not present

## 2024-03-11 MED ORDER — HYDROCHLOROTHIAZIDE 12.5 MG PO TABS: 12.5000 mg | ORAL_TABLET | Freq: Every day | ORAL | 1 refills | Status: AC

## 2024-03-11 NOTE — Patient Instructions (Addendum)
 We can try starting hydrochlorothiazide  12.5 mg once per day for blood pressure, decrease the carvedilol  back to 12.5 mg twice per day and continue same dose losartan  for now.  Monitor your blood pressure on this regimen and make sure this is not causing blood pressure to go too low.  As we discussed your home blood pressure machine has been falsely high but with the diastolic still in the 80s, I think it is reasonable to make this adjustment.  Follow-up with me in 2 weeks and I prescribed a new blood pressure monitor with prescription.  Take that to your pharmacy.  Bring that machine to your next visit.  Glad to hear the headache and blurry vision has resolved.  If the symptoms return, be seen.  If any acute worsening of symptoms, focal weakness, slurred speech or other acute changes be seen in the ER but I do not expect that to occur.  I am checking a blood test for a type of temporal headache but I expect that to be okay.  Return to the clinic or go to the nearest emergency room if any of your symptoms worsen or new symptoms occur.  Managing Your Hypertension Hypertension, also called high blood pressure, is when the force of the blood pressing against the walls of the arteries is too strong. Arteries are blood vessels that carry blood from your heart throughout your body. Hypertension forces the heart to work harder to pump blood and may cause the arteries to become narrow or stiff. Understanding blood pressure readings A blood pressure reading includes a higher number over a lower number: The first, or top, number is called the systolic pressure. It is a measure of the pressure in your arteries as your heart beats. The second, or bottom number, is called the diastolic pressure. It is a measure of the pressure in your arteries as the heart relaxes. For most people, a normal blood pressure is below 120/80. Your personal target blood pressure may vary depending on your medical conditions, your age, and  other factors. Blood pressure is classified into four stages. Based on your blood pressure reading, your health care provider may use the following stages to determine what type of treatment you need, if any. Systolic pressure and diastolic pressure are measured in a unit called millimeters of mercury (mmHg). Normal Systolic pressure: below 120. Diastolic pressure: below 80. Elevated Systolic pressure: 120-129. Diastolic pressure: below 80. Hypertension stage 1 Systolic pressure: 130-139. Diastolic pressure: 80-89. Hypertension stage 2 Systolic pressure: 140 or above. Diastolic pressure: 90 or above. How can this condition affect me? Managing your hypertension is very important. Over time, hypertension can damage the arteries and decrease blood flow to parts of the body, including the brain, heart, and kidneys. Having untreated or uncontrolled hypertension can lead to: A heart attack. A stroke. A weakened blood vessel (aneurysm). Heart failure. Kidney damage. Eye damage. Memory and concentration problems. Vascular dementia. What actions can I take to manage this condition? Hypertension can be managed by making lifestyle changes and possibly by taking medicines. Your health care provider will help you make a plan to bring your blood pressure within a normal range. You may be referred for counseling on a healthy diet and physical activity. Nutrition  Eat a diet that is high in fiber and potassium, and low in salt (sodium), added sugar, and fat. An example eating plan is called the DASH diet. DASH stands for Dietary Approaches to Stop Hypertension. To eat this way: Eat plenty  of fresh fruits and vegetables. Try to fill one-half of your plate at each meal with fruits and vegetables. Eat whole grains, such as whole-wheat pasta, brown rice, or whole-grain bread. Fill about one-fourth of your plate with whole grains. Eat low-fat dairy products. Avoid fatty cuts of meat, processed or cured  meats, and poultry with skin. Fill about one-fourth of your plate with lean proteins such as fish, chicken without skin, beans, eggs, and tofu. Avoid pre-made and processed foods. These tend to be higher in sodium, added sugar, and fat. Reduce your daily sodium intake. Many people with hypertension should eat less than 1,500 mg of sodium a day. Lifestyle  Work with your health care provider to maintain a healthy body weight or to lose weight. Ask what an ideal weight is for you. Get at least 30 minutes of exercise that causes your heart to beat faster (aerobic exercise) most days of the week. Activities may include walking, swimming, or biking. Include exercise to strengthen your muscles (resistance exercise), such as weight lifting, as part of your weekly exercise routine. Try to do these types of exercises for 30 minutes at least 3 days a week. Do not use any products that contain nicotine or tobacco. These products include cigarettes, chewing tobacco, and vaping devices, such as e-cigarettes. If you need help quitting, ask your health care provider. Control any long-term (chronic) conditions you have, such as high cholesterol or diabetes. Identify your sources of stress and find ways to manage stress. This may include meditation, deep breathing, or making time for fun activities. Alcohol use Do not drink alcohol if: Your health care provider tells you not to drink. You are pregnant, may be pregnant, or are planning to become pregnant. If you drink alcohol: Limit how much you have to: 0-1 drink a day for women. 0-2 drinks a day for men. Know how much alcohol is in your drink. In the [place] one drink equals one 12 oz bottle of beer (355 mL), one 5 oz glass of wine (148 mL), or one 1 oz glass of hard liquor (44 mL). Medicines Your health care provider may prescribe medicine if lifestyle changes are not enough to get your blood pressure under control and if: Your systolic blood pressure is 130  or higher. Your diastolic blood pressure is 80 or higher. Take medicines only as told by your health care provider. Follow the directions carefully. Blood pressure medicines must be taken as told by your health care provider. The medicine does not work as well when you skip doses. Skipping doses also puts you at risk for problems. Monitoring Before you monitor your blood pressure: Do not smoke, drink caffeinated beverages, or exercise within 30 minutes before taking a measurement. Use the bathroom and empty your bladder (urinate). Sit quietly for at least 5 minutes before taking measurements. Monitor your blood pressure at home as told by your health care provider. To do this: Sit with your back straight and supported. Place your feet flat on the floor. Do not cross your legs. Support your arm on a flat surface, such as a table. Make sure your upper arm is at heart level. Each time you measure, take two or three readings one minute apart and record the results. You may also need to have your blood pressure checked regularly by your health care provider. General information Talk with your health care provider about your diet, exercise habits, and other lifestyle factors that may be contributing to hypertension. Review all the medicines  you take with your health care provider because there may be side effects or interactions. Keep all follow-up visits. Your health care provider can help you create and adjust your plan for managing your high blood pressure. Where to find more information National Heart, [employer], and [employer]: popsteam.is American Heart Association: www.heart.org Contact a health care provider if: You think you are having a reaction to medicines you have taken. You have repeated (recurrent) headaches. You feel dizzy. You have swelling in your ankles. You have trouble with your vision. Get help right away if: You develop a severe headache or confusion. You have  unusual weakness or numbness, or you feel faint. You have severe pain in your chest or abdomen. You vomit repeatedly. You have trouble breathing. These symptoms may be an emergency. Get help right away. Call 911. Do not wait to see if the symptoms will go away. Do not drive yourself to the hospital. Summary Hypertension is when the force of blood pumping through your arteries is too strong. If this condition is not controlled, it may put you at risk for serious complications. Your personal target blood pressure may vary depending on your medical conditions, your age, and other factors. For most people, a normal blood pressure is less than 120/80. Hypertension is managed by lifestyle changes, medicines, or both. Lifestyle changes to help manage hypertension include losing weight, eating a healthy, low-sodium diet, exercising more, stopping smoking, and limiting alcohol. This information is not intended to replace advice given to you by your health care provider. Make sure you discuss any questions you have with your health care provider. Document Revised: 11/02/2020 Document Reviewed: 11/02/2020 [employer] Patient Education  2024 [employer].

## 2024-03-11 NOTE — Progress Notes (Signed)
 "  Subjective:  Patient ID: Shawn JONELLE Theo Mickey., male    DOB: April 21, 1960  Age: 64 y.o. MRN: [id number]  CC:  Chief Complaint  Patient presents with   Acute Visit    Elevated BP. Patient brought in readings. 126-150/80-96. HA's on the left side. Sometimes blurry vision. Patient brought BP machine and we got 153/98 on right side. 153/100 left side    HPI Shawn Ball. presents for    Hypertension: Acute visit for concerns of elevated blood pressures ranging from 126-150/80-96 on his home readings.  Intermittent headaches on the left side with blurry vision.  No focal weakness. BP 126/84 manually today compared to patient's blood pressure machine of 153/98 on the left, 153/100 on left.  We discussed blood pressure concerns at his December 8 visit, borderline control at the current levels in office.  We decided against medication changes at that time as potentially he was able to cut back on some higher sodium foods.  He continued carvedilol  12.5 mg twice daily, losartan  100 mg daily.  We had increased his carvedilol  from November to December from the 6.25 mg twice daily dose prior.  In past 2 weeks now taking total 25mg  carvedilol  BID. No changes in salt since last visit - only adding to eggs, restaurant food 1-2 times per week.  No current HA, no current blurry vision. Last felt these sx's few days ago. No new weakness or slurred speech. No jaw pain. Home readings as above.  BP Readings from Last 3 Encounters:  03/11/24 126/84  02/09/24 120/80  01/15/24 138/84   Lab Results  Component Value Date   CREATININE 1.04 01/15/2024    History Patient Active Problem List   Diagnosis Date Noted   Constipation 09/25/2023   Secondary localized osteoarthrosis of ankle and foot 09/20/2022   Closed fracture of distal end of right radius 03/12/2022   Radial styloid tenosynovitis of right hand 03/12/2022   Right wrist pain 03/12/2022   Acute viral conjunctivitis of right eye 08/28/2020    Routine general medical examination at a health care facility 05/01/2020   Hyperlipidemia LDL goal <70 03/15/2020   Hypertriglyceridemia 03/15/2020   Diabetes mellitus without complication (HCC)    Traumatic arthritis of ankle 03/12/2020   Arthralgia of left ankle 03/08/2020   Benign essential HTN 08/25/2019   Acute sinusitis 06/28/2016   Past Medical History:  Diagnosis Date   Allergy    Arthritis    Cataract    Chronic bronchitis    Chronic pain    per pt/ right shoulder pain, left leg and ankle   CKD (chronic kidney disease) stage 3, GFR 30-59 ml/min (HCC)    Costochondral chest pain    due to torn tendons.   CTS (carpal tunnel syndrome)    right hand   Diabetes mellitus without complication (HCC)    prediabetes/ no meds   Diverticulitis    GERD (gastroesophageal reflux disease)    History of anal fissures    Hyperlipemia    Hypertension    Osteoporosis    Rectal bleeding    occasional   Seasonal allergies    Past Surgical History:  Procedure Laterality Date   ANKLE FRACTURE SURGERY  2005 / 2007   left ankle and  left leg/ have screws in leg and ankle   CARPAL TUNNEL RELEASE Left 07/29/2019   Procedure: CARPAL TUNNEL RELEASE;  Surgeon: Murrell Kuba, MD;  Location: [facility];  Service: Orthopedics;  Laterality: Left;  FOREARM BLOCK   EXAMINATION UNDER ANESTHESIA N/A 12/15/2012   Procedure: EXAM UNDER ANESTHESIA;  Surgeon: Camellia CHRISTELLA Blush, MD;  Location: [facility];  Service: General;  Laterality: N/A;   FRACTURE SURGERY  2005 and 2007   lt ankle x2   HEMORRHOIDECTOMY WITH HEMORRHOID BANDING N/A 12/15/2012   Procedure: EXCISIONAL HEMORRHOIDECTOMY WITH HEMORRHOID BANDING;  Surgeon: Camellia CHRISTELLA Blush, MD;  Location: [facility];  Service: General;  Laterality: N/A;   HERNIA REPAIR  1996   rotator cuff surgery      x 2/ right shoulder   TRIGGER FINGER RELEASE  07/23/2011   Procedure: Right hand- RELEASE TRIGGER FINGER/A-1  PULLEY;  Surgeon: Lamar LULLA Leonor Mickey., MD;  Location: [facility];  Service: Orthopedics;  Laterality: Right;   WRIST FRACTURE SURGERY  1984   lt with bone graft   WRIST SURGERY     left wrist   Allergies[1] Prior to Admission medications  Medication Sig Start Date End Date Taking? Authorizing Provider  acetaminophen  (TYLENOL ) 500 MG tablet Take 500 mg by mouth every 6 (six) hours as needed for mild pain (pain score 1-3) or moderate pain (pain score 4-6).   Yes [provider]  ASPIRIN 81 PO Take 1 tablet by mouth daily.   Yes [provider]  Biotin 5000 MCG CAPS 2 times a week 12/03/14  Yes [provider]  Blood Glucose Monitoring Suppl DEVI 1 each by Does not apply route as directed. Dispense based on patient and insurance preference. Use up to four times daily as directed. (FOR ICD-10 E10.9, E11.9). 02/13/24  Yes Levora Shawn SAUNDERS, MD  carvedilol  (COREG ) 12.5 MG tablet Take 1 tablet (12.5 mg total) by mouth 2 (two) times daily with a meal. 01/15/24  Yes Levora Shawn SAUNDERS, MD  cetirizine  (ZYRTEC ) 10 MG tablet Take 1 tablet (10 mg total) by mouth daily. 10/28/19  Yes Duanne Butler DASEN, MD  Collagen Hydrolysate, Bovine, POWD by Does not apply route. Takes [facility] for arthritis   Yes [provider]  FIBER COMPLETE PO Take by mouth. Vita Fusion fiber gummies   Yes [provider]  Flavoring Agent (MANGO FLAVOR) POWD by Does not apply route.   Yes [provider]  fluticasone  (FLONASE ) 50 MCG/ACT nasal spray Place 2 sprays into both nostrils daily. 04/27/18  Yes Duanne Butler DASEN, MD  Garlic Oil 1000 MG CAPS  10/02/20  Yes [provider]  Glucosamine-Chondroitin-MSM 500-200-150 MG TABS Take by mouth.   Yes [provider]  Glucose Blood (BLOOD GLUCOSE TEST STRIPS) STRP 1 each by Does not apply route as directed. Dispense based on patient and insurance preference. Use up to four times daily as directed. (FOR ICD-10  E10.9, E11.9). 02/13/24  Yes Levora Shawn SAUNDERS, MD  L-Lysine 1000 MG TABS Take by mouth.   Yes [provider]  Lancet Device MISC 1 each by Does not apply route as directed. Dispense based on patient and insurance preference. Use up to four times daily as directed. (FOR ICD-10 E10.9, E11.9). 02/13/24  Yes Levora Shawn SAUNDERS, MD  Lancets MISC 1 each by Does not apply route as directed. Dispense based on patient and insurance preference. Use up to four times daily as directed. (FOR ICD-10 E10.9, E11.9). 02/13/24  Yes Levora Shawn SAUNDERS, MD  losartan  (COZAAR ) 100 MG tablet Take 1 tablet (100 mg total) by mouth daily. 05/22/23  Yes Levora Shawn SAUNDERS, MD  MAGNESIUM OXIDE PO Take 375 mg by  mouth daily.   Yes [provider]  Multiple Vitamins-Minerals (VISION FORMULA EYE HEALTH PO)  06/02/17  Yes [provider]  omega-3 acid ethyl esters (LOVAZA ) 1 g capsule Take 2 capsules by mouth twice daily 12/30/23  Yes Levora Shawn SAUNDERS, MD  omeprazole  (PRILOSEC) 20 MG capsule Take 1 capsule (20 mg total) by mouth 2 (two) times daily before a meal. 05/22/23  Yes Levora Shawn SAUNDERS, MD  pravastatin  (PRAVACHOL ) 20 MG tablet Take 1 tablet by mouth once daily 02/03/24  Yes Levora Shawn SAUNDERS, MD  TRADJENTA  5 MG TABS tablet Take 1 tablet by mouth once daily 03/05/24  Yes Levora Shawn SAUNDERS, MD  mometasone -formoterol (DULERA) 100-5 MCG/ACT AERO Inhale 2 puffs into the lungs 2 (two) times daily. Patient not taking: Reported on 03/11/2024    [provider]   Social History   Socioeconomic History   Marital status: Divorced    Spouse name: Not on file   Number of children: 1   Years of education: Not on file   Highest education level: Some college, no degree  Occupational History   Occupation: disabled  Tobacco Use   Smoking status: Never   Smokeless tobacco: Former    Types: Chew   Tobacco comments:    Quit age 9 y.o  Vaping Use   Vaping status: Never Used  Substance and Sexual  Activity   Alcohol use: No   Drug use: No   Sexual activity: Not Currently  Other Topics Concern   Not on file  Social History Narrative   Not on file   Social Drivers of Health   Tobacco Use: Medium Risk (03/11/2024)   Patient History    Smoking Tobacco Use: Never    Smokeless Tobacco Use: Former    Passive Exposure: Not on Actuary Strain: Low Risk (12/18/2023)   Overall Financial Resource Strain (CARDIA)    Difficulty of Paying Living Expenses: Not hard at all  Food Insecurity: No Food Insecurity (12/18/2023)   Epic    Worried About Programme Researcher, Broadcasting/film/video in the Last Year: Never true    Ran Out of Food in the Last Year: Never true  Transportation Needs: No Transportation Needs (12/18/2023)   Epic    Lack of Transportation (Medical): No    Lack of Transportation (Non-Medical): No  Physical Activity: Insufficiently Active (12/18/2023)   Exercise Vital Sign    Days of Exercise per Week: 1 day    Minutes of Exercise per Session: 30 min  Stress: No Stress Concern Present (12/18/2023)   [employer] of Occupational Health - Occupational Stress Questionnaire    Feeling of Stress: Not at all  Social Connections: Moderately Integrated (12/18/2023)   Social Connection and Isolation Panel    Frequency of Communication with Friends and Family: More than three times a week    Frequency of Social Gatherings with Friends and Family: More than three times a week    Attends Religious Services: More than 4 times per year    Active Member of Clubs or Organizations: Yes    Attends Banker Meetings: More than 4 times per year    Marital Status: Divorced  Intimate Partner Violence: Not on file  Depression (PHQ2-9): Low Risk (03/03/2023)   Depression (PHQ2-9)    PHQ-2 Score: 0  Alcohol Screen: Low Risk (05/21/2023)   Alcohol Screen    Last Alcohol Screening Score (AUDIT): 0  Housing: Low Risk (12/18/2023)   Epic    Unable  to Pay for Housing in the Last Year:  No    Number of Times Moved in the Last Year: 0    Homeless in the Last Year: No  Utilities: Not on file  Health Literacy: Adequate Health Literacy (10/13/2023)   B1300 Health Literacy    Frequency of need for help with medical instructions: Never    Review of Systems   Objective:   Vitals:   03/11/24 1606  BP: 126/84  Pulse: 82  Resp: 16  Temp: 98 F (36.7 C)  TempSrc: Temporal  SpO2: 97%  Weight: 217 lb (98.4 kg)  Height: 5' 11 (1.803 m)     Physical Exam Vitals reviewed.  Constitutional:      Appearance: He is well-developed.  HENT:     Head: Normocephalic and atraumatic.  Neck:     Vascular: No carotid bruit or JVD.  Cardiovascular:     Rate and Rhythm: Normal rate and regular rhythm.     Heart sounds: Normal heart sounds. No murmur heard. Pulmonary:     Effort: Pulmonary effort is normal.     Breath sounds: Normal breath sounds. No rales.  Musculoskeletal:     Right lower leg: No edema.     Left lower leg: No edema.  Skin:    General: Skin is warm and dry.  Neurological:     General: No focal deficit present.     Mental Status: He is alert and oriented to person, place, and time. Mental status is at baseline.     Motor: No weakness.     Gait: Gait normal.     Comments: Scalp and temporal scalp nontender.  No cords.  Psychiatric:        Mood and Affect: Mood normal.        Behavior: Behavior normal.        Assessment & Plan:  Shawn Jeune. is a 64 y.o. male . Left-sided headache - Plan: Sedimentation rate  Blurry vision - Plan: Sedimentation rate  Essential hypertension - Plan: Basic metabolic panel with GFR, hydrochlorothiazide  (HYDRODIURIL ) 12.5 MG tablet  Borderline control based on his diastolic.  Home blood pressure readings have been falsely elevated on his machine.  I think would be reasonable to add HCTZ, decrease the carvedilol  back to 12.5 mg twice daily, continue same dose losartan , home monitoring with new machine and  recheck in the next 2 weeks.   Prior left-sided headache and blurry vision have resolved.  Nonfocal neuroexam.  Check sed rate but less likely giant cell arteritis.  ER precautions given if any acute vision change, focal weakness, slurred speech, or acute worsening of symptoms.  Understanding expressed.  Recheck in 2 weeks.  Meds ordered this encounter  Medications   hydrochlorothiazide  (HYDRODIURIL ) 12.5 MG tablet    Sig: Take 1 tablet (12.5 mg total) by mouth daily.    Dispense:  90 tablet    Refill:  1   Patient Instructions  We can try starting hydrochlorothiazide  12.5 mg once per day for blood pressure, decrease the carvedilol  back to 12.5 mg twice per day and continue same dose losartan  for now.  Monitor your blood pressure on this regimen and make sure this is not causing her blood pressure to go too low.  As we discussed your home blood pressure machine has been writing a falsely high but with the diastolic still in the 80s, I think it is reasonable to make this adjustment.  Follow-up with me in 2 weeks and  I prescribed a new blood pressure monitor with prescription.  Take that to your pharmacy.  Bring that machine to your next visit.  Glad to hear the headache and blurry vision has resolved.  If the symptoms return, be seen.  If any acute worsening of symptoms, focal weakness, slurred speech or other acute changes be seen in the ER but I do not expect that to occur.  I am checking a blood test for a type of temporal headache but I expect that to be okay.  Return to the clinic or go to the nearest emergency room if any of your symptoms worsen or new symptoms occur.  Managing Your Hypertension Hypertension, also called high blood pressure, is when the force of the blood pressing against the walls of the arteries is too strong. Arteries are blood vessels that carry blood from your heart throughout your body. Hypertension forces the heart to work harder to pump blood and may cause the  arteries to become narrow or stiff. Understanding blood pressure readings A blood pressure reading includes a higher number over a lower number: The first, or top, number is called the systolic pressure. It is a measure of the pressure in your arteries as your heart beats. The second, or bottom number, is called the diastolic pressure. It is a measure of the pressure in your arteries as the heart relaxes. For most people, a normal blood pressure is below 120/80. Your personal target blood pressure may vary depending on your medical conditions, your age, and other factors. Blood pressure is classified into four stages. Based on your blood pressure reading, your health care provider may use the following stages to determine what type of treatment you need, if any. Systolic pressure and diastolic pressure are measured in a unit called millimeters of mercury (mmHg). Normal Systolic pressure: below 120. Diastolic pressure: below 80. Elevated Systolic pressure: 120-129. Diastolic pressure: below 80. Hypertension stage 1 Systolic pressure: 130-139. Diastolic pressure: 80-89. Hypertension stage 2 Systolic pressure: 140 or above. Diastolic pressure: 90 or above. How can this condition affect me? Managing your hypertension is very important. Over time, hypertension can damage the arteries and decrease blood flow to parts of the body, including the brain, heart, and kidneys. Having untreated or uncontrolled hypertension can lead to: A heart attack. A stroke. A weakened blood vessel (aneurysm). Heart failure. Kidney damage. Eye damage. Memory and concentration problems. Vascular dementia. What actions can I take to manage this condition? Hypertension can be managed by making lifestyle changes and possibly by taking medicines. Your health care provider will help you make a plan to bring your blood pressure within a normal range. You may be referred for counseling on a healthy diet and physical  activity. Nutrition  Eat a diet that is high in fiber and potassium, and low in salt (sodium), added sugar, and fat. An example eating plan is called the DASH diet. DASH stands for Dietary Approaches to Stop Hypertension. To eat this way: Eat plenty of fresh fruits and vegetables. Try to fill one-half of your plate at each meal with fruits and vegetables. Eat whole grains, such as whole-wheat pasta, brown rice, or whole-grain bread. Fill about one-fourth of your plate with whole grains. Eat low-fat dairy products. Avoid fatty cuts of meat, processed or cured meats, and poultry with skin. Fill about one-fourth of your plate with lean proteins such as fish, chicken without skin, beans, eggs, and tofu. Avoid pre-made and processed foods. These tend to be higher in sodium,  added sugar, and fat. Reduce your daily sodium intake. Many people with hypertension should eat less than 1,500 mg of sodium a day. Lifestyle  Work with your health care provider to maintain a healthy body weight or to lose weight. Ask what an ideal weight is for you. Get at least 30 minutes of exercise that causes your heart to beat faster (aerobic exercise) most days of the week. Activities may include walking, swimming, or biking. Include exercise to strengthen your muscles (resistance exercise), such as weight lifting, as part of your weekly exercise routine. Try to do these types of exercises for 30 minutes at least 3 days a week. Do not use any products that contain nicotine or tobacco. These products include cigarettes, chewing tobacco, and vaping devices, such as e-cigarettes. If you need help quitting, ask your health care provider. Control any long-term (chronic) conditions you have, such as high cholesterol or diabetes. Identify your sources of stress and find ways to manage stress. This may include meditation, deep breathing, or making time for fun activities. Alcohol use Do not drink alcohol if: Your health care  provider tells you not to drink. You are pregnant, may be pregnant, or are planning to become pregnant. If you drink alcohol: Limit how much you have to: 0-1 drink a day for women. 0-2 drinks a day for men. Know how much alcohol is in your drink. In the [place] one drink equals one 12 oz bottle of beer (355 mL), one 5 oz glass of wine (148 mL), or one 1 oz glass of hard liquor (44 mL). Medicines Your health care provider may prescribe medicine if lifestyle changes are not enough to get your blood pressure under control and if: Your systolic blood pressure is 130 or higher. Your diastolic blood pressure is 80 or higher. Take medicines only as told by your health care provider. Follow the directions carefully. Blood pressure medicines must be taken as told by your health care provider. The medicine does not work as well when you skip doses. Skipping doses also puts you at risk for problems. Monitoring Before you monitor your blood pressure: Do not smoke, drink caffeinated beverages, or exercise within 30 minutes before taking a measurement. Use the bathroom and empty your bladder (urinate). Sit quietly for at least 5 minutes before taking measurements. Monitor your blood pressure at home as told by your health care provider. To do this: Sit with your back straight and supported. Place your feet flat on the floor. Do not cross your legs. Support your arm on a flat surface, such as a table. Make sure your upper arm is at heart level. Each time you measure, take two or three readings one minute apart and record the results. You may also need to have your blood pressure checked regularly by your health care provider. General information Talk with your health care provider about your diet, exercise habits, and other lifestyle factors that may be contributing to hypertension. Review all the medicines you take with your health care provider because there may be side effects or interactions. Keep all  follow-up visits. Your health care provider can help you create and adjust your plan for managing your high blood pressure. Where to find more information National Heart, Lung, and Blood Institute: popsteam.is American Heart Association: www.heart.org Contact a health care provider if: You think you are having a reaction to medicines you have taken. You have repeated (recurrent) headaches. You feel dizzy. You have swelling in your ankles. You have trouble  with your vision. Get help right away if: You develop a severe headache or confusion. You have unusual weakness or numbness, or you feel faint. You have severe pain in your chest or abdomen. You vomit repeatedly. You have trouble breathing. These symptoms may be an emergency. Get help right away. Call 911. Do not wait to see if the symptoms will go away. Do not drive yourself to the hospital. Summary Hypertension is when the force of blood pumping through your arteries is too strong. If this condition is not controlled, it may put you at risk for serious complications. Your personal target blood pressure may vary depending on your medical conditions, your age, and other factors. For most people, a normal blood pressure is less than 120/80. Hypertension is managed by lifestyle changes, medicines, or both. Lifestyle changes to help manage hypertension include losing weight, eating a healthy, low-sodium diet, exercising more, stopping smoking, and limiting alcohol. This information is not intended to replace advice given to you by your health care provider. Make sure you discuss any questions you have with your health care provider. Document Revised: 11/02/2020 Document Reviewed: 11/02/2020 Elsevier Patient Education  2024 Elsevier Inc.    Signed,   Shawn Pines, MD [facility], [facility] 03/11/2024 4:46 PM      [1]  Allergies Allergen Reactions   Amlodipine Other (See  Comments)    Per pt - damage to kidney's   Codeine Nausea And Vomiting   Sulfa Antibiotics Nausea Only    nauseated   "

## 2024-03-12 LAB — BASIC METABOLIC PANEL WITH GFR
BUN: 15 mg/dL (ref 6–23)
CO2: 27 meq/L (ref 19–32)
Calcium: 9.2 mg/dL (ref 8.4–10.5)
Chloride: 104 meq/L (ref 96–112)
Creatinine, Ser: 1.15 mg/dL (ref 0.40–1.50)
GFR: 68.39 mL/min
Glucose, Bld: 129 mg/dL — ABNORMAL HIGH (ref 70–99)
Potassium: 4 meq/L (ref 3.5–5.1)
Sodium: 139 meq/L (ref 135–145)

## 2024-03-12 LAB — SEDIMENTATION RATE: Sed Rate: 3 mm/h (ref 0–20)

## 2024-03-14 ENCOUNTER — Ambulatory Visit: Payer: Self-pay | Admitting: Family Medicine

## 2024-03-24 ENCOUNTER — Ambulatory Visit: Admitting: Family Medicine

## 2024-04-02 ENCOUNTER — Ambulatory Visit: Admitting: Family Medicine

## 2024-04-02 ENCOUNTER — Encounter: Payer: Self-pay | Admitting: Family Medicine

## 2024-04-02 VITALS — BP 120/76 | HR 99 | Temp 97.9°F | Resp 18 | Ht 71.0 in | Wt 223.2 lb

## 2024-04-02 DIAGNOSIS — I10 Essential (primary) hypertension: Secondary | ICD-10-CM | POA: Diagnosis not present

## 2024-04-02 NOTE — Patient Instructions (Signed)
 Thank you for coming in today. No change in medications at this time.  We do have the option to increase carvedilol  again back to the 25 mg dose but I do not think that needs to occur at this time.  Glad to hear you are tolerating the hydrochlorothiazide .  Bring your blood pressure monitor to your next visit and we can recheck it against our meter here.  If any new symptoms or concerns prior to your next visit please let me know.  Take care!

## 2024-04-02 NOTE — Progress Notes (Signed)
 "  Subjective:  Patient ID: Fairy JONELLE Theo Mickey., male    DOB: 10-23-1960  Age: 64 y.o. MRN: [id number]  CC:  Chief Complaint  Patient presents with   Follow-up    Blood pressure recheck. Patient brought in home readings. Highest was 147 systolic and 94 diastolic    HPI Nesta Kimple. presents for   Hypertension: Follow-up from office visit January 8.  Concern for elevated blood pressures at that time on his home readings along with intermittent headaches on the left side with blurry vision but no focal weakness.  He had increased his carvedilol , and was up to 25 mg twice daily at that time.  Denied any current headache or blurry vision at that time, had felt symptoms most recently a few weeks prior.  No weakness slurred speech jaw pain or other concerning symptoms and reassuring exam at that time.  Blood pressure was 126/84 at that visit.  He did bring his machine and home blood pressure readings were falsely elevated on that machine.  However given his current blood pressure at that time we did add HCTZ 12.5 mg, decrease the carvedilol  back to 12.5 mg twice daily and continue same dose of losartan  at 100 mg daily.  Sed rate was normal, unlikely giant cell arteritis.  RTC/ER precautions were given.  Since last visit, as above his highest reading was 147/94. Has been working on getting new BP monitor.  Some difficulty with finding new monitor.  See copy of readings - recent from 1/21 - 130-147/86-95.  No recurrence of headaches or blurry vision. Back on carvedilol  12.5mg  BID, tolerating hydrochlorothiazide  12.5mg  every day. Feeling ok since last visit.   BP Readings from Last 3 Encounters:  04/02/24 120/76  03/11/24 126/84  02/09/24 120/80   Lab Results  Component Value Date   CREATININE 1.15 03/11/2024   Results for orders placed or performed in visit on 03/11/24  Sedimentation rate   Collection Time: 03/11/24  4:28 PM  Result Value Ref Range   Sed Rate 3 0 - 20 mm/hr   Basic metabolic panel with GFR   Collection Time: 03/11/24  4:28 PM  Result Value Ref Range   Sodium 139 135 - 145 mEq/L   Potassium 4.0 3.5 - 5.1 mEq/L   Chloride 104 96 - 112 mEq/L   CO2 27 19 - 32 mEq/L   Glucose, Bld 129 (H) 70 - 99 mg/dL   BUN 15 6 - 23 mg/dL   Creatinine, Ser 8.84 0.40 - 1.50 mg/dL   GFR 31.60 >39.99 mL/min   Calcium  9.2 8.4 - 10.5 mg/dL   Next appointment scheduled with me for routine care is March 23. History Patient Active Problem List   Diagnosis Date Noted   Constipation 09/25/2023   Secondary localized osteoarthrosis of ankle and foot 09/20/2022   Closed fracture of distal end of right radius 03/12/2022   Radial styloid tenosynovitis of right hand 03/12/2022   Right wrist pain 03/12/2022   Acute viral conjunctivitis of right eye 08/28/2020   Routine general medical examination at a health care facility 05/01/2020   Hyperlipidemia LDL goal <70 03/15/2020   Hypertriglyceridemia 03/15/2020   Diabetes mellitus without complication (HCC)    Traumatic arthritis of ankle 03/12/2020   Arthralgia of left ankle 03/08/2020   Benign essential HTN 08/25/2019   Acute sinusitis 06/28/2016   Past Medical History:  Diagnosis Date   Allergy    Arthritis    Cataract    Chronic bronchitis  Chronic pain    per pt/ right shoulder pain, left leg and ankle   CKD (chronic kidney disease) stage 3, GFR 30-59 ml/min (HCC)    Costochondral chest pain    due to torn tendons.   CTS (carpal tunnel syndrome)    right hand   Diabetes mellitus without complication (HCC)    prediabetes/ no meds   Diverticulitis    GERD (gastroesophageal reflux disease)    History of anal fissures    Hyperlipemia    Hypertension    Osteoporosis    Rectal bleeding    occasional   Seasonal allergies    Past Surgical History:  Procedure Laterality Date   ANKLE FRACTURE SURGERY  2005 / 2007   left ankle and  left leg/ have screws in leg and ankle   CARPAL TUNNEL RELEASE Left  07/29/2019   Procedure: CARPAL TUNNEL RELEASE;  Surgeon: Murrell Kuba, MD;  Location: [facility];  Service: Orthopedics;  Laterality: Left;  FOREARM BLOCK   EXAMINATION UNDER ANESTHESIA N/A 12/15/2012   Procedure: EXAM UNDER ANESTHESIA;  Surgeon: Camellia CHRISTELLA Blush, MD;  Location: [facility];  Service: General;  Laterality: N/A;   FRACTURE SURGERY  2005 and 2007   lt ankle x2   HEMORRHOIDECTOMY WITH HEMORRHOID BANDING N/A 12/15/2012   Procedure: EXCISIONAL HEMORRHOIDECTOMY WITH HEMORRHOID BANDING;  Surgeon: Camellia CHRISTELLA Blush, MD;  Location: [facility];  Service: General;  Laterality: N/A;   HERNIA REPAIR  1996   rotator cuff surgery      x 2/ right shoulder   TRIGGER FINGER RELEASE  07/23/2011   Procedure: Right hand- RELEASE TRIGGER FINGER/A-1 PULLEY;  Surgeon: Lamar LULLA Leonor Mickey., MD;  Location: [facility];  Service: Orthopedics;  Laterality: Right;   WRIST FRACTURE SURGERY  1984   lt with bone graft   WRIST SURGERY     left wrist   Allergies[1] Prior to Admission medications  Medication Sig Start Date End Date Taking? Authorizing Provider  acetaminophen  (TYLENOL ) 500 MG tablet Take 500 mg by mouth every 6 (six) hours as needed for mild pain (pain score 1-3) or moderate pain (pain score 4-6).   Yes [provider]  ASPIRIN 81 PO Take 1 tablet by mouth daily.   Yes [provider]  Biotin 5000 MCG CAPS 2 times a week 12/03/14  Yes [provider]  Blood Glucose Monitoring Suppl DEVI 1 each by Does not apply route as directed. Dispense based on patient and insurance preference. Use up to four times daily as directed. (FOR ICD-10 E10.9, E11.9). 02/13/24  Yes Levora Reyes SAUNDERS, MD  carvedilol  (COREG ) 12.5 MG tablet Take 1 tablet (12.5 mg total) by mouth 2 (two) times daily with a meal. 01/15/24  Yes Levora Reyes SAUNDERS, MD  cetirizine  (ZYRTEC ) 10 MG tablet Take 1 tablet (10 mg total) by mouth daily. 10/28/19  Yes  Duanne Butler DASEN, MD  Collagen Hydrolysate, Bovine, POWD by Does not apply route. Tax Inspector for arthritis   Yes [provider]  Continuous Glucose Sensor (FREESTYLE LIBRE 3 PLUS SENSOR) MISC 1 each by Does not apply route every 14 (fourteen) days. Change sensor every 15 days.   Yes [provider]  FIBER COMPLETE PO Take by mouth. Vita Fusion fiber gummies   Yes [provider]  Flavoring Agent (MANGO FLAVOR) POWD by Does not apply route.   Yes [provider]  fluticasone  (FLONASE ) 50 MCG/ACT nasal spray Place 2 sprays into  both nostrils daily. 04/27/18  Yes Duanne Butler DASEN, MD  Garlic Oil 1000 MG CAPS  10/02/20  Yes [provider]  Glucosamine-Chondroitin-MSM 500-200-150 MG TABS Take by mouth.   Yes [provider]  Glucose Blood (BLOOD GLUCOSE TEST STRIPS) STRP 1 each by Does not apply route as directed. Dispense based on patient and insurance preference. Use up to four times daily as directed. (FOR ICD-10 E10.9, E11.9). 02/13/24  Yes Levora Reyes SAUNDERS, MD  hydrochlorothiazide  (HYDRODIURIL ) 12.5 MG tablet Take 1 tablet (12.5 mg total) by mouth daily. 03/11/24  Yes Levora Reyes SAUNDERS, MD  L-Lysine 1000 MG TABS Take by mouth.   Yes [provider]  Lancet Device MISC 1 each by Does not apply route as directed. Dispense based on patient and insurance preference. Use up to four times daily as directed. (FOR ICD-10 E10.9, E11.9). 02/13/24  Yes Levora Reyes SAUNDERS, MD  Lancets MISC 1 each by Does not apply route as directed. Dispense based on patient and insurance preference. Use up to four times daily as directed. (FOR ICD-10 E10.9, E11.9). 02/13/24  Yes Levora Reyes SAUNDERS, MD  losartan  (COZAAR ) 100 MG tablet Take 1 tablet (100 mg total) by mouth daily. 05/22/23  Yes Levora Reyes SAUNDERS, MD  MAGNESIUM OXIDE PO Take 375 mg by mouth daily.   Yes [provider]  Multiple Vitamins-Minerals (VISION FORMULA EYE HEALTH PO)  06/02/17  Yes  [provider]  omega-3 acid ethyl esters (LOVAZA ) 1 g capsule Take 2 capsules by mouth twice daily 12/30/23  Yes Levora Reyes SAUNDERS, MD  omeprazole  (PRILOSEC) 20 MG capsule Take 1 capsule (20 mg total) by mouth 2 (two) times daily before a meal. 05/22/23  Yes Levora Reyes SAUNDERS, MD  pravastatin  (PRAVACHOL ) 20 MG tablet Take 1 tablet by mouth once daily 02/03/24  Yes Levora Reyes SAUNDERS, MD  TRADJENTA  5 MG TABS tablet Take 1 tablet by mouth once daily 03/05/24  Yes Levora Reyes SAUNDERS, MD  mometasone -formoterol (DULERA) 100-5 MCG/ACT AERO Inhale 2 puffs into the lungs 2 (two) times daily. Patient not taking: Reported on 04/02/2024    [provider]   Social History   Socioeconomic History   Marital status: Divorced    Spouse name: Not on file   Number of children: 1   Years of education: Not on file   Highest education level: Some college, no degree  Occupational History   Occupation: disabled  Tobacco Use   Smoking status: Never   Smokeless tobacco: Former    Types: Chew   Tobacco comments:    Quit age 38 y.o  Vaping Use   Vaping status: Never Used  Substance and Sexual Activity   Alcohol use: No   Drug use: No   Sexual activity: Not Currently  Other Topics Concern   Not on file  Social History Narrative   Not on file   Social Drivers of Health   Tobacco Use: Medium Risk (04/02/2024)   Patient History    Smoking Tobacco Use: Never    Smokeless Tobacco Use: Former    Passive Exposure: Not on Actuary Strain: Low Risk (12/18/2023)   Overall Financial Resource Strain (CARDIA)    Difficulty of Paying Living Expenses: Not hard at all  Food Insecurity: No Food Insecurity (12/18/2023)   Epic    Worried About Programme Researcher, Broadcasting/film/video in the Last Year: Never true    Ran Out of Food in the Last Year: Never true  Transportation Needs:  No Transportation Needs (12/18/2023)   Epic    Lack of Transportation (Medical): No    Lack of Transportation  (Non-Medical): No  Physical Activity: Insufficiently Active (12/18/2023)   Exercise Vital Sign    Days of Exercise per Week: 1 day    Minutes of Exercise per Session: 30 min  Stress: No Stress Concern Present (12/18/2023)   [employer] of Occupational Health - Occupational Stress Questionnaire    Feeling of Stress: Not at all  Social Connections: Moderately Integrated (12/18/2023)   Social Connection and Isolation Panel    Frequency of Communication with Friends and Family: More than three times a week    Frequency of Social Gatherings with Friends and Family: More than three times a week    Attends Religious Services: More than 4 times per year    Active Member of Clubs or Organizations: Yes    Attends Banker Meetings: More than 4 times per year    Marital Status: Divorced  Intimate Partner Violence: Not on file  Depression (PHQ2-9): Low Risk (03/03/2023)   Depression (PHQ2-9)    PHQ-2 Score: 0  Alcohol Screen: Low Risk (05/21/2023)   Alcohol Screen    Last Alcohol Screening Score (AUDIT): 0  Housing: Low Risk (12/18/2023)   Epic    Unable to Pay for Housing in the Last Year: No    Number of Times Moved in the Last Year: 0    Homeless in the Last Year: No  Utilities: Not on file  Health Literacy: Adequate Health Literacy (10/13/2023)   B1300 Health Literacy    Frequency of need for help with medical instructions: Never    Review of Systems Per HPI, no recurrence of headache or blurry vision, no new side effects on HCTZ  Objective:   Vitals:   04/02/24 0937  BP: 120/76  Pulse: 99  Resp: 18  Temp: 97.9 F (36.6 C)  TempSrc: Temporal  SpO2: 99%  Weight: 223 lb 3.2 oz (101.2 kg)  Height: 5' 11 (1.803 m)     Physical Exam Vitals reviewed.  Constitutional:      Appearance: He is well-developed.  HENT:     Head: Normocephalic and atraumatic.  Neck:     Vascular: No carotid bruit or JVD.  Cardiovascular:     Rate and Rhythm: Normal rate and  regular rhythm.     Heart sounds: Normal heart sounds. No murmur heard. Pulmonary:     Effort: Pulmonary effort is normal.     Breath sounds: Normal breath sounds. No rales.  Musculoskeletal:     Right lower leg: No edema.     Left lower leg: No edema.  Skin:    General: Skin is warm and dry.  Neurological:     Mental Status: He is alert and oriented to person, place, and time.  Psychiatric:        Mood and Affect: Mood normal.        Assessment & Plan:  Mikah Poss. is a 64 y.o. male . Essential hypertension Stable, tolerating HCTZ.  Elevated home readings but meter has been falsely high prior.  We do have the option of slightly increasing his carvedilol  back to 25 mg but at current level I do not think we need to make that change.  Asymptomatic without recurrence of headache, blurry vision or other new symptoms.  RTC precautions given if those occur again.  Has follow-up in March, can check labs and review other chronic conditions at  that time.  Plan discussed and he agrees with this approach.  All questions were answered.  No orders of the defined types were placed in this encounter.  Patient Instructions  Thank you for coming in today. No change in medications at this time.  We do have the option to increase carvedilol  again back to the 25 mg dose but I do not think that needs to occur at this time.  Glad to hear you are tolerating the hydrochlorothiazide .  Bring your blood pressure monitor to your next visit and we can recheck it against our meter here.  If any new symptoms or concerns prior to your next visit please let me know.  Take care!     Signed,   Reyes Pines, MD [facility], [facility] 04/02/24 10:06 AM      [1]  Allergies Allergen Reactions   Amlodipine Other (See Comments)    Per pt - damage to kidney's   Codeine Nausea And Vomiting   Sulfa Antibiotics Nausea Only    nauseated   "

## 2024-05-17 ENCOUNTER — Encounter: Admitting: Family Medicine

## 2024-05-24 ENCOUNTER — Encounter: Admitting: Family Medicine

## 2024-06-09 ENCOUNTER — Encounter: Admitting: Family Medicine
